# Patient Record
Sex: Female | Born: 1950 | Race: White | Hispanic: No | Marital: Married | Smoking: Former smoker
Health system: Southern US, Community
[De-identification: ages and names within clinical notes are randomized; demographics above are authoritative.]

## PROBLEM LIST (undated history)

## (undated) DIAGNOSIS — R569 Unspecified convulsions: Secondary | ICD-10-CM

## (undated) DIAGNOSIS — K219 Gastro-esophageal reflux disease without esophagitis: Secondary | ICD-10-CM

## (undated) DIAGNOSIS — M797 Fibromyalgia: Secondary | ICD-10-CM

## (undated) DIAGNOSIS — E079 Disorder of thyroid, unspecified: Secondary | ICD-10-CM

## (undated) DIAGNOSIS — M8580 Other specified disorders of bone density and structure, unspecified site: Secondary | ICD-10-CM

## (undated) DIAGNOSIS — F419 Anxiety disorder, unspecified: Secondary | ICD-10-CM

## (undated) DIAGNOSIS — G40909 Epilepsy, unspecified, not intractable, without status epilepticus: Secondary | ICD-10-CM

## (undated) DIAGNOSIS — R9431 Abnormal electrocardiogram [ECG] [EKG]: Secondary | ICD-10-CM

## (undated) DIAGNOSIS — G2581 Restless legs syndrome: Secondary | ICD-10-CM

## (undated) DIAGNOSIS — F32A Depression, unspecified: Secondary | ICD-10-CM

## (undated) DIAGNOSIS — F1010 Alcohol abuse, uncomplicated: Secondary | ICD-10-CM

## (undated) DIAGNOSIS — T7840XA Allergy, unspecified, initial encounter: Secondary | ICD-10-CM

## (undated) DIAGNOSIS — E785 Hyperlipidemia, unspecified: Secondary | ICD-10-CM

## (undated) DIAGNOSIS — K589 Irritable bowel syndrome without diarrhea: Secondary | ICD-10-CM

## (undated) DIAGNOSIS — E063 Autoimmune thyroiditis: Secondary | ICD-10-CM

## (undated) DIAGNOSIS — F329 Major depressive disorder, single episode, unspecified: Secondary | ICD-10-CM

## (undated) HISTORY — DX: Disorder of thyroid, unspecified: E07.9

## (undated) HISTORY — DX: Autoimmune thyroiditis: E06.3

## (undated) HISTORY — DX: Restless legs syndrome: G25.81

## (undated) HISTORY — PX: WRIST SURGERY: SHX841

## (undated) HISTORY — DX: Allergy, unspecified, initial encounter: T78.40XA

## (undated) HISTORY — PX: BUNIONECTOMY: SHX129

## (undated) HISTORY — PX: FOOT SURGERY: SHX648

## (undated) HISTORY — DX: Major depressive disorder, single episode, unspecified: F32.9

## (undated) HISTORY — DX: Epilepsy, unspecified, not intractable, without status epilepticus: G40.909

## (undated) HISTORY — PX: TONSILLECTOMY: SUR1361

## (undated) HISTORY — DX: Alcohol abuse, uncomplicated: F10.10

## (undated) HISTORY — DX: Other specified disorders of bone density and structure, unspecified site: M85.80

## (undated) HISTORY — PX: LIPOSUCTION: SHX10

## (undated) HISTORY — DX: Fibromyalgia: M79.7

## (undated) HISTORY — DX: Irritable bowel syndrome, unspecified: K58.9

## (undated) HISTORY — DX: Depression, unspecified: F32.A

## (undated) HISTORY — DX: Gastro-esophageal reflux disease without esophagitis: K21.9

## (undated) HISTORY — DX: Hyperlipidemia, unspecified: E78.5

---

## 1998-05-20 ENCOUNTER — Other Ambulatory Visit: Admission: RE | Admit: 1998-05-20 | Discharge: 1998-05-20 | Payer: Self-pay | Admitting: *Deleted

## 1998-10-08 ENCOUNTER — Encounter: Payer: Self-pay | Admitting: Gastroenterology

## 1998-10-08 ENCOUNTER — Ambulatory Visit (HOSPITAL_COMMUNITY): Admission: RE | Admit: 1998-10-08 | Discharge: 1998-10-08 | Payer: Self-pay | Admitting: Gastroenterology

## 1999-01-17 ENCOUNTER — Encounter (INDEPENDENT_AMBULATORY_CARE_PROVIDER_SITE_OTHER): Payer: Self-pay | Admitting: Specialist

## 1999-01-17 ENCOUNTER — Other Ambulatory Visit: Admission: RE | Admit: 1999-01-17 | Discharge: 1999-01-17 | Payer: Self-pay | Admitting: *Deleted

## 1999-09-04 ENCOUNTER — Other Ambulatory Visit: Admission: RE | Admit: 1999-09-04 | Discharge: 1999-09-04 | Payer: Self-pay | Admitting: *Deleted

## 2000-01-27 ENCOUNTER — Emergency Department (HOSPITAL_COMMUNITY): Admission: EM | Admit: 2000-01-27 | Discharge: 2000-01-27 | Payer: Self-pay | Admitting: Emergency Medicine

## 2001-06-08 ENCOUNTER — Ambulatory Visit (HOSPITAL_COMMUNITY): Admission: RE | Admit: 2001-06-08 | Discharge: 2001-06-08 | Payer: Self-pay | Admitting: Family Medicine

## 2001-06-08 ENCOUNTER — Encounter: Payer: Self-pay | Admitting: Family Medicine

## 2001-06-20 ENCOUNTER — Ambulatory Visit (HOSPITAL_COMMUNITY): Admission: RE | Admit: 2001-06-20 | Discharge: 2001-06-20 | Payer: Self-pay | Admitting: Family Medicine

## 2001-06-20 ENCOUNTER — Encounter: Payer: Self-pay | Admitting: Family Medicine

## 2001-06-30 ENCOUNTER — Encounter: Admission: RE | Admit: 2001-06-30 | Discharge: 2001-06-30 | Payer: Self-pay | Admitting: Family Medicine

## 2001-06-30 ENCOUNTER — Encounter: Payer: Self-pay | Admitting: Family Medicine

## 2001-11-07 ENCOUNTER — Other Ambulatory Visit: Admission: RE | Admit: 2001-11-07 | Discharge: 2001-11-07 | Payer: Self-pay | Admitting: *Deleted

## 2003-02-15 ENCOUNTER — Other Ambulatory Visit: Admission: RE | Admit: 2003-02-15 | Discharge: 2003-02-15 | Payer: Self-pay

## 2004-10-20 ENCOUNTER — Other Ambulatory Visit: Admission: RE | Admit: 2004-10-20 | Discharge: 2004-10-20 | Payer: Self-pay | Admitting: Obstetrics and Gynecology

## 2005-02-11 ENCOUNTER — Encounter: Admission: RE | Admit: 2005-02-11 | Discharge: 2005-02-11 | Payer: Self-pay | Admitting: Gastroenterology

## 2006-01-30 ENCOUNTER — Observation Stay (HOSPITAL_COMMUNITY): Admission: EM | Admit: 2006-01-30 | Discharge: 2006-02-01 | Payer: Self-pay | Admitting: Emergency Medicine

## 2006-02-01 ENCOUNTER — Encounter: Payer: Self-pay | Admitting: Cardiology

## 2006-06-25 ENCOUNTER — Ambulatory Visit: Payer: Self-pay | Admitting: Family Medicine

## 2006-07-01 ENCOUNTER — Ambulatory Visit: Payer: Self-pay | Admitting: Family Medicine

## 2006-07-01 LAB — CONVERTED CEMR LAB
ALT: 19 units/L (ref 0–40)
AST: 23 units/L (ref 0–37)
HDL: 71.5 mg/dL (ref >39.0)
TSH: 4.08 microintl units/mL (ref 0.35–5.50)

## 2006-07-23 ENCOUNTER — Ambulatory Visit: Payer: Self-pay | Admitting: Family Medicine

## 2006-08-02 ENCOUNTER — Ambulatory Visit: Payer: Self-pay | Admitting: Family Medicine

## 2006-08-02 LAB — CONVERTED CEMR LAB
ALT: 23 units/L (ref 0–40)
Albumin: 3.6 g/dL (ref 3.5–5.2)
Basophils Relative: 0.2 % (ref 0.0–1.0)
Chloride: 109 meq/L (ref 96–112)
Creatinine, Ser: 0.7 mg/dL (ref 0.4–1.2)
MCHC: 33.9 g/dL (ref 30.0–36.0)
MCV: 91.1 fL (ref 78.0–100.0)
Neutrophils Relative %: 67.6 % (ref 43.0–77.0)
Platelets: 229 10*3/uL (ref 150–400)
Potassium: 4.1 meq/L (ref 3.5–5.1)
Total Protein: 6 g/dL (ref 6.0–8.3)
WBC: 5 10*3/uL (ref 4.5–10.5)

## 2006-08-05 ENCOUNTER — Encounter: Payer: Self-pay | Admitting: Family Medicine

## 2006-10-13 ENCOUNTER — Encounter: Admission: RE | Admit: 2006-10-13 | Discharge: 2006-10-13 | Payer: Self-pay | Admitting: Family Medicine

## 2006-10-13 ENCOUNTER — Ambulatory Visit: Payer: Self-pay | Admitting: Family Medicine

## 2006-12-15 DIAGNOSIS — K649 Unspecified hemorrhoids: Secondary | ICD-10-CM | POA: Insufficient documentation

## 2006-12-15 DIAGNOSIS — K219 Gastro-esophageal reflux disease without esophagitis: Secondary | ICD-10-CM

## 2006-12-15 DIAGNOSIS — G40909 Epilepsy, unspecified, not intractable, without status epilepticus: Secondary | ICD-10-CM | POA: Insufficient documentation

## 2006-12-15 DIAGNOSIS — G9340 Encephalopathy, unspecified: Secondary | ICD-10-CM | POA: Insufficient documentation

## 2006-12-15 DIAGNOSIS — E039 Hypothyroidism, unspecified: Secondary | ICD-10-CM

## 2006-12-15 DIAGNOSIS — M899 Disorder of bone, unspecified: Secondary | ICD-10-CM | POA: Insufficient documentation

## 2006-12-15 DIAGNOSIS — R319 Hematuria, unspecified: Secondary | ICD-10-CM | POA: Insufficient documentation

## 2006-12-15 DIAGNOSIS — M949 Disorder of cartilage, unspecified: Secondary | ICD-10-CM

## 2007-01-12 ENCOUNTER — Encounter (INDEPENDENT_AMBULATORY_CARE_PROVIDER_SITE_OTHER): Payer: Self-pay | Admitting: Family Medicine

## 2007-02-09 ENCOUNTER — Encounter (INDEPENDENT_AMBULATORY_CARE_PROVIDER_SITE_OTHER): Payer: Self-pay | Admitting: Family Medicine

## 2007-02-22 ENCOUNTER — Telehealth (INDEPENDENT_AMBULATORY_CARE_PROVIDER_SITE_OTHER): Payer: Self-pay | Admitting: *Deleted

## 2007-03-09 ENCOUNTER — Ambulatory Visit: Payer: Self-pay | Admitting: Family Medicine

## 2007-03-09 ENCOUNTER — Encounter (INDEPENDENT_AMBULATORY_CARE_PROVIDER_SITE_OTHER): Payer: Self-pay | Admitting: Family Medicine

## 2007-03-09 DIAGNOSIS — M25549 Pain in joints of unspecified hand: Secondary | ICD-10-CM

## 2007-03-10 ENCOUNTER — Telehealth (INDEPENDENT_AMBULATORY_CARE_PROVIDER_SITE_OTHER): Payer: Self-pay | Admitting: *Deleted

## 2007-03-15 ENCOUNTER — Ambulatory Visit: Payer: Self-pay | Admitting: Family Medicine

## 2007-03-22 ENCOUNTER — Telehealth (INDEPENDENT_AMBULATORY_CARE_PROVIDER_SITE_OTHER): Payer: Self-pay | Admitting: *Deleted

## 2007-03-22 LAB — CONVERTED CEMR LAB
ALT: 28 U/L
AST: 33 U/L
Cholesterol: 167 mg/dL
HDL: 61.6 mg/dL
LDL Cholesterol: 87 mg/dL
Total CHOL/HDL Ratio: 2.7
Triglycerides: 94 mg/dL
VLDL: 19 mg/dL

## 2007-04-20 LAB — HM COLONOSCOPY

## 2007-05-11 ENCOUNTER — Ambulatory Visit: Payer: Self-pay | Admitting: Family Medicine

## 2007-05-11 DIAGNOSIS — J019 Acute sinusitis, unspecified: Secondary | ICD-10-CM

## 2007-05-11 DIAGNOSIS — J309 Allergic rhinitis, unspecified: Secondary | ICD-10-CM | POA: Insufficient documentation

## 2007-06-29 ENCOUNTER — Encounter (INDEPENDENT_AMBULATORY_CARE_PROVIDER_SITE_OTHER): Payer: Self-pay | Admitting: Family Medicine

## 2007-07-16 ENCOUNTER — Emergency Department (HOSPITAL_COMMUNITY): Admission: EM | Admit: 2007-07-16 | Discharge: 2007-07-16 | Payer: Self-pay | Admitting: Emergency Medicine

## 2007-07-26 ENCOUNTER — Encounter (INDEPENDENT_AMBULATORY_CARE_PROVIDER_SITE_OTHER): Payer: Self-pay | Admitting: Family Medicine

## 2007-08-02 ENCOUNTER — Ambulatory Visit: Payer: Self-pay | Admitting: Family Medicine

## 2007-08-02 ENCOUNTER — Telehealth (INDEPENDENT_AMBULATORY_CARE_PROVIDER_SITE_OTHER): Payer: Self-pay | Admitting: Family Medicine

## 2007-08-02 DIAGNOSIS — E785 Hyperlipidemia, unspecified: Secondary | ICD-10-CM

## 2007-08-02 DIAGNOSIS — F1010 Alcohol abuse, uncomplicated: Secondary | ICD-10-CM | POA: Insufficient documentation

## 2007-08-04 ENCOUNTER — Other Ambulatory Visit: Admission: RE | Admit: 2007-08-04 | Discharge: 2007-08-04 | Payer: Self-pay | Admitting: Obstetrics and Gynecology

## 2007-08-04 LAB — CONVERTED CEMR LAB
ALT: 25 units/L (ref 0–35)
AST: 26 units/L (ref 0–37)
Cholesterol: 174 mg/dL (ref 0–200)
HDL: 65.6 mg/dL (ref >39.0)

## 2007-08-05 ENCOUNTER — Encounter (INDEPENDENT_AMBULATORY_CARE_PROVIDER_SITE_OTHER): Payer: Self-pay | Admitting: *Deleted

## 2007-08-10 ENCOUNTER — Telehealth (INDEPENDENT_AMBULATORY_CARE_PROVIDER_SITE_OTHER): Payer: Self-pay | Admitting: *Deleted

## 2007-08-11 ENCOUNTER — Ambulatory Visit: Payer: Self-pay | Admitting: Family Medicine

## 2007-08-11 DIAGNOSIS — R61 Generalized hyperhidrosis: Secondary | ICD-10-CM

## 2007-08-11 LAB — CONVERTED CEMR LAB
ALT: 26 units/L (ref 0–35)
AST: 26 units/L (ref 0–37)
Alkaline Phosphatase: 102 units/L (ref 39–117)
BUN: 21 mg/dL (ref 6–23)
Bilirubin, Direct: 0.1 mg/dL (ref 0.0–0.3)
CO2: 26 meq/L (ref 19–32)
Calcium: 9.8 mg/dL (ref 8.4–10.5)
Creatinine, Ser: 0.8 mg/dL (ref 0.4–1.2)
Eosinophils Absolute: 0.1 10*3/uL (ref 0.0–0.6)
Eosinophils Relative: 1 % (ref 0.0–5.0)
GFR calc non Af Amer: 79 mL/min
Lymphocytes Relative: 32.9 % (ref 12.0–46.0)
MCHC: 35.4 g/dL (ref 30.0–36.0)
Monocytes Absolute: 0.5 10*3/uL (ref 0.2–0.7)
Monocytes Relative: 6.8 % (ref 3.0–11.0)
RBC: 4.6 M/uL (ref 3.87–5.11)
RDW: 12.6 % (ref 11.5–14.6)
Total Bilirubin: 0.6 mg/dL (ref 0.3–1.2)
Total Protein: 7.1 g/dL (ref 6.0–8.3)
WBC: 7.1 10*3/uL (ref 4.5–10.5)

## 2007-08-12 ENCOUNTER — Telehealth (INDEPENDENT_AMBULATORY_CARE_PROVIDER_SITE_OTHER): Payer: Self-pay | Admitting: Family Medicine

## 2008-01-17 LAB — CONVERTED CEMR LAB: Pap Smear: NORMAL

## 2008-01-23 ENCOUNTER — Encounter: Payer: Self-pay | Admitting: Family Medicine

## 2008-02-24 ENCOUNTER — Ambulatory Visit: Payer: Self-pay | Admitting: *Deleted

## 2008-02-24 ENCOUNTER — Encounter: Payer: Self-pay | Admitting: Family Medicine

## 2008-02-24 LAB — CONVERTED CEMR LAB
Albumin: 4.5 g/dL (ref 3.5–5.2)
Alkaline Phosphatase: 124 units/L — ABNORMAL HIGH (ref 39–117)
Bilirubin, Direct: 0.1 mg/dL (ref 0.0–0.3)
TSH: 0.929 microintl units/mL (ref 0.350–4.500)
Total Protein: 6.8 g/dL (ref 6.0–8.3)

## 2008-03-29 ENCOUNTER — Ambulatory Visit: Payer: Self-pay | Admitting: *Deleted

## 2008-03-29 LAB — CONVERTED CEMR LAB
Alkaline Phosphatase: 109 units/L (ref 39–117)
Bilirubin, Direct: 0.1 mg/dL (ref 0.0–0.3)
Total Bilirubin: 0.7 mg/dL (ref 0.3–1.2)

## 2008-05-31 ENCOUNTER — Emergency Department (HOSPITAL_BASED_OUTPATIENT_CLINIC_OR_DEPARTMENT_OTHER): Admission: EM | Admit: 2008-05-31 | Discharge: 2008-05-31 | Payer: Self-pay | Admitting: Emergency Medicine

## 2008-07-02 ENCOUNTER — Encounter (INDEPENDENT_AMBULATORY_CARE_PROVIDER_SITE_OTHER): Payer: Self-pay | Admitting: *Deleted

## 2008-07-03 ENCOUNTER — Telehealth (INDEPENDENT_AMBULATORY_CARE_PROVIDER_SITE_OTHER): Payer: Self-pay | Admitting: *Deleted

## 2008-07-04 ENCOUNTER — Ambulatory Visit: Payer: Self-pay | Admitting: *Deleted

## 2008-07-04 DIAGNOSIS — F329 Major depressive disorder, single episode, unspecified: Secondary | ICD-10-CM

## 2008-07-04 DIAGNOSIS — J069 Acute upper respiratory infection, unspecified: Secondary | ICD-10-CM | POA: Insufficient documentation

## 2008-07-04 DIAGNOSIS — H60399 Other infective otitis externa, unspecified ear: Secondary | ICD-10-CM | POA: Insufficient documentation

## 2008-07-23 ENCOUNTER — Ambulatory Visit: Payer: Self-pay | Admitting: *Deleted

## 2008-07-23 DIAGNOSIS — H6120 Impacted cerumen, unspecified ear: Secondary | ICD-10-CM | POA: Insufficient documentation

## 2008-07-30 ENCOUNTER — Ambulatory Visit: Payer: Self-pay | Admitting: *Deleted

## 2008-09-03 ENCOUNTER — Telehealth (INDEPENDENT_AMBULATORY_CARE_PROVIDER_SITE_OTHER): Payer: Self-pay | Admitting: *Deleted

## 2008-09-05 ENCOUNTER — Ambulatory Visit: Payer: Self-pay | Admitting: *Deleted

## 2008-09-05 DIAGNOSIS — G2581 Restless legs syndrome: Secondary | ICD-10-CM | POA: Insufficient documentation

## 2008-09-05 LAB — CONVERTED CEMR LAB
ALT: 20 units/L (ref 0–35)
BUN: 22 mg/dL (ref 6–23)
Calcium: 9.3 mg/dL (ref 8.4–10.5)
Cholesterol: 180 mg/dL (ref 0–200)
Eosinophils Absolute: 0.1 10*3/uL (ref 0.0–0.7)
GFR calc Af Amer: 133 mL/min
GFR calc non Af Amer: 110 mL/min
HDL: 69.9 mg/dL (ref >39.0)
Hemoglobin: 14.3 g/dL (ref 12.0–15.0)
Lymphocytes Relative: 32.8 % (ref 12.0–46.0)
Monocytes Absolute: 0.5 10*3/uL (ref 0.1–1.0)
Monocytes Relative: 7.1 % (ref 3.0–12.0)
Neutro Abs: 4 10*3/uL (ref 1.4–7.7)
Neutrophils Relative %: 58.2 % (ref 43.0–77.0)
Platelets: 255 10*3/uL (ref 150–400)
TSH: 0.42 microintl units/mL (ref 0.35–5.50)
VLDL: 23 mg/dL (ref 0–40)

## 2008-11-16 ENCOUNTER — Telehealth: Payer: Self-pay | Admitting: Internal Medicine

## 2008-12-13 ENCOUNTER — Ambulatory Visit: Payer: Self-pay | Admitting: Family Medicine

## 2008-12-13 DIAGNOSIS — M722 Plantar fascial fibromatosis: Secondary | ICD-10-CM

## 2009-01-10 ENCOUNTER — Ambulatory Visit: Payer: Self-pay | Admitting: Family Medicine

## 2009-01-16 ENCOUNTER — Encounter (INDEPENDENT_AMBULATORY_CARE_PROVIDER_SITE_OTHER): Payer: Self-pay | Admitting: *Deleted

## 2009-01-16 ENCOUNTER — Encounter: Payer: Self-pay | Admitting: Family Medicine

## 2009-01-16 LAB — CONVERTED CEMR LAB
AST: 20 units/L
Alkaline Phosphatase: 125 units/L
BUN: 27 mg/dL
Creatinine, Ser: 0.74 mg/dL
Total Bilirubin: 0.3 mg/dL
Total Protein: 6.7 g/dL

## 2009-01-18 ENCOUNTER — Encounter (INDEPENDENT_AMBULATORY_CARE_PROVIDER_SITE_OTHER): Payer: Self-pay | Admitting: *Deleted

## 2009-01-23 ENCOUNTER — Encounter: Payer: Self-pay | Admitting: Family Medicine

## 2009-02-04 ENCOUNTER — Telehealth (INDEPENDENT_AMBULATORY_CARE_PROVIDER_SITE_OTHER): Payer: Self-pay | Admitting: *Deleted

## 2009-02-25 ENCOUNTER — Ambulatory Visit: Payer: Self-pay | Admitting: Family Medicine

## 2009-02-27 ENCOUNTER — Encounter (INDEPENDENT_AMBULATORY_CARE_PROVIDER_SITE_OTHER): Payer: Self-pay | Admitting: *Deleted

## 2009-03-26 ENCOUNTER — Telehealth (INDEPENDENT_AMBULATORY_CARE_PROVIDER_SITE_OTHER): Payer: Self-pay | Admitting: *Deleted

## 2009-04-16 ENCOUNTER — Encounter (INDEPENDENT_AMBULATORY_CARE_PROVIDER_SITE_OTHER): Payer: Self-pay | Admitting: *Deleted

## 2009-05-02 ENCOUNTER — Telehealth (INDEPENDENT_AMBULATORY_CARE_PROVIDER_SITE_OTHER): Payer: Self-pay | Admitting: *Deleted

## 2009-06-10 ENCOUNTER — Ambulatory Visit: Payer: Self-pay | Admitting: Family Medicine

## 2009-06-10 ENCOUNTER — Encounter: Payer: Self-pay | Admitting: Family Medicine

## 2009-06-10 DIAGNOSIS — M6740 Ganglion, unspecified site: Secondary | ICD-10-CM | POA: Insufficient documentation

## 2009-06-10 DIAGNOSIS — Z780 Asymptomatic menopausal state: Secondary | ICD-10-CM | POA: Insufficient documentation

## 2009-06-10 DIAGNOSIS — H5310 Unspecified subjective visual disturbances: Secondary | ICD-10-CM | POA: Insufficient documentation

## 2009-06-11 ENCOUNTER — Encounter: Payer: Self-pay | Admitting: Family Medicine

## 2009-06-11 ENCOUNTER — Ambulatory Visit: Payer: Self-pay | Admitting: Family Medicine

## 2009-06-11 LAB — CONVERTED CEMR LAB
Bilirubin Urine: NEGATIVE
WBC Urine, dipstick: NEGATIVE
pH: 7

## 2009-06-12 ENCOUNTER — Encounter: Payer: Self-pay | Admitting: Family Medicine

## 2009-06-17 LAB — CONVERTED CEMR LAB
ALT: 24 units/L (ref 0–35)
BUN: 23 mg/dL (ref 6–23)
CO2: 28 meq/L (ref 19–32)
Calcium: 9.5 mg/dL (ref 8.4–10.5)
Chloride: 111 meq/L (ref 96–112)
Eosinophils Relative: 1.2 % (ref 0.0–5.0)
Glucose, Bld: 97 mg/dL (ref 70–99)
HCT: 42 % (ref 36.0–46.0)
HDL: 71.9 mg/dL (ref >39.00)
LDL Cholesterol: 99 mg/dL (ref 0–99)
Lymphs Abs: 2.3 10*3/uL (ref 0.7–4.0)
MCV: 94.3 fL (ref 78.0–100.0)
Monocytes Absolute: 0.4 10*3/uL (ref 0.1–1.0)
Monocytes Relative: 5.8 % (ref 3.0–12.0)
Neutrophils Relative %: 58.7 % (ref 43.0–77.0)
Platelets: 253 10*3/uL (ref 150.0–400.0)
Potassium: 4.7 meq/L (ref 3.5–5.1)
RBC: 4.45 M/uL (ref 3.87–5.11)
Sodium: 144 meq/L (ref 135–145)
Total Bilirubin: 0.8 mg/dL (ref 0.3–1.2)
Total Protein: 6.9 g/dL (ref 6.0–8.3)
Triglycerides: 94 mg/dL (ref 0.0–149.0)
WBC: 6.8 10*3/uL (ref 4.5–10.5)

## 2009-07-29 ENCOUNTER — Encounter: Payer: Self-pay | Admitting: Family Medicine

## 2009-09-10 ENCOUNTER — Ambulatory Visit: Payer: Self-pay | Admitting: Family

## 2009-09-10 DIAGNOSIS — R197 Diarrhea, unspecified: Secondary | ICD-10-CM

## 2009-09-10 DIAGNOSIS — R5383 Other fatigue: Secondary | ICD-10-CM

## 2009-09-10 DIAGNOSIS — R5381 Other malaise: Secondary | ICD-10-CM | POA: Insufficient documentation

## 2009-09-10 LAB — CONVERTED CEMR LAB
ALT: 21 units/L (ref 0–35)
BUN: 20 mg/dL (ref 6–23)
Basophils Absolute: 0.1 10*3/uL (ref 0.0–0.1)
Chloride: 111 meq/L (ref 96–112)
Eosinophils Absolute: 0 10*3/uL (ref 0.0–0.7)
GFR calc non Af Amer: 78.22 mL/min (ref >60)
HCT: 41.2 % (ref 36.0–46.0)
Lymphocytes Relative: 32.2 % (ref 12.0–46.0)
Lymphs Abs: 2.1 10*3/uL (ref 0.7–4.0)
Monocytes Absolute: 0.4 10*3/uL (ref 0.1–1.0)
Monocytes Relative: 6.9 % (ref 3.0–12.0)
Neutrophils Relative %: 59.4 % (ref 43.0–77.0)
Platelets: 247 10*3/uL (ref 150.0–400.0)
RBC: 4.31 M/uL (ref 3.87–5.11)
Tissue Transglutaminase Ab, IgA: 0.1 units (ref <7)
Total Bilirubin: 0.2 mg/dL — ABNORMAL LOW (ref 0.3–1.2)

## 2009-09-11 ENCOUNTER — Encounter: Payer: Self-pay | Admitting: Family

## 2009-09-16 ENCOUNTER — Encounter: Payer: Self-pay | Admitting: Family Medicine

## 2009-09-24 ENCOUNTER — Ambulatory Visit: Payer: Self-pay | Admitting: Family

## 2009-10-04 ENCOUNTER — Ambulatory Visit: Payer: Self-pay | Admitting: Family Medicine

## 2009-10-04 ENCOUNTER — Encounter: Payer: Self-pay | Admitting: Family Medicine

## 2009-10-04 DIAGNOSIS — R079 Chest pain, unspecified: Secondary | ICD-10-CM | POA: Insufficient documentation

## 2009-10-07 LAB — CONVERTED CEMR LAB
ALT: 18 units/L (ref 0–35)
AST: 21 units/L (ref 0–37)
Albumin: 4.1 g/dL (ref 3.5–5.2)
Basophils Absolute: 0.1 10*3/uL (ref 0.0–0.1)
Bilirubin, Direct: 0.1 mg/dL (ref 0.0–0.3)
CK-MB: 1.3 ng/mL (ref 0.3–4.0)
CO2: 27 meq/L (ref 19–32)
Calcium: 9.3 mg/dL (ref 8.4–10.5)
Eosinophils Absolute: 0.1 10*3/uL (ref 0.0–0.7)
Eosinophils Relative: 0.7 % (ref 0.0–5.0)
GFR calc non Af Amer: 91.23 mL/min (ref >60)
HCT: 41.3 % (ref 36.0–46.0)
Hemoglobin: 13.9 g/dL (ref 12.0–15.0)
Lymphocytes Relative: 34.9 % (ref 12.0–46.0)
MCHC: 33.6 g/dL (ref 30.0–36.0)
Platelets: 236 10*3/uL (ref 150.0–400.0)
Potassium: 3.5 meq/L (ref 3.5–5.1)
RBC: 4.38 M/uL (ref 3.87–5.11)
T3, Free: 2.5 pg/mL (ref 2.3–4.2)
TSH: 0.68 microintl units/mL (ref 0.35–5.50)
Total Bilirubin: 0.5 mg/dL (ref 0.3–1.2)
Total CK: 60 units/L (ref 7–177)
Total Protein: 6.9 g/dL (ref 6.0–8.3)

## 2009-10-08 LAB — CONVERTED CEMR LAB
Anti Nuclear Antibody(ANA): NEGATIVE
Vit D, 25-Hydroxy: 38 ng/mL (ref 30–89)

## 2009-10-15 ENCOUNTER — Encounter: Payer: Self-pay | Admitting: Family Medicine

## 2009-12-03 ENCOUNTER — Encounter: Payer: Self-pay | Admitting: Family Medicine

## 2009-12-09 ENCOUNTER — Telehealth (INDEPENDENT_AMBULATORY_CARE_PROVIDER_SITE_OTHER): Payer: Self-pay | Admitting: *Deleted

## 2009-12-23 ENCOUNTER — Telehealth (INDEPENDENT_AMBULATORY_CARE_PROVIDER_SITE_OTHER): Payer: Self-pay | Admitting: *Deleted

## 2009-12-24 ENCOUNTER — Encounter: Payer: Self-pay | Admitting: Family Medicine

## 2010-01-08 ENCOUNTER — Inpatient Hospital Stay (HOSPITAL_COMMUNITY): Admission: EM | Admit: 2010-01-08 | Discharge: 2010-01-10 | Payer: Self-pay | Admitting: Internal Medicine

## 2010-01-08 ENCOUNTER — Encounter: Payer: Self-pay | Admitting: Emergency Medicine

## 2010-01-08 ENCOUNTER — Ambulatory Visit: Payer: Self-pay | Admitting: Radiology

## 2010-01-16 ENCOUNTER — Ambulatory Visit: Payer: Self-pay | Admitting: Family Medicine

## 2010-01-16 DIAGNOSIS — N3946 Mixed incontinence: Secondary | ICD-10-CM

## 2010-01-16 DIAGNOSIS — J189 Pneumonia, unspecified organism: Secondary | ICD-10-CM

## 2010-02-10 ENCOUNTER — Other Ambulatory Visit: Admission: RE | Admit: 2010-02-10 | Discharge: 2010-02-10 | Payer: Self-pay | Admitting: Family Medicine

## 2010-02-10 ENCOUNTER — Ambulatory Visit: Payer: Self-pay | Admitting: Family Medicine

## 2010-02-10 DIAGNOSIS — IMO0002 Reserved for concepts with insufficient information to code with codable children: Secondary | ICD-10-CM | POA: Insufficient documentation

## 2010-02-10 DIAGNOSIS — L0591 Pilonidal cyst without abscess: Secondary | ICD-10-CM | POA: Insufficient documentation

## 2010-02-10 DIAGNOSIS — N952 Postmenopausal atrophic vaginitis: Secondary | ICD-10-CM | POA: Insufficient documentation

## 2010-02-17 ENCOUNTER — Encounter (INDEPENDENT_AMBULATORY_CARE_PROVIDER_SITE_OTHER): Payer: Self-pay | Admitting: *Deleted

## 2010-02-17 LAB — CONVERTED CEMR LAB: Pap Smear: NEGATIVE

## 2010-02-25 ENCOUNTER — Encounter: Payer: Self-pay | Admitting: Family Medicine

## 2010-02-26 ENCOUNTER — Encounter: Payer: Self-pay | Admitting: Family Medicine

## 2010-03-05 ENCOUNTER — Encounter: Payer: Self-pay | Admitting: Family Medicine

## 2010-03-06 ENCOUNTER — Ambulatory Visit (HOSPITAL_BASED_OUTPATIENT_CLINIC_OR_DEPARTMENT_OTHER): Admission: RE | Admit: 2010-03-06 | Discharge: 2010-03-06 | Payer: Self-pay | Admitting: Family Medicine

## 2010-03-06 ENCOUNTER — Ambulatory Visit: Payer: Self-pay | Admitting: Diagnostic Radiology

## 2010-04-03 ENCOUNTER — Ambulatory Visit: Payer: Self-pay | Admitting: Family Medicine

## 2010-04-04 ENCOUNTER — Encounter: Payer: Self-pay | Admitting: Family Medicine

## 2010-04-04 LAB — CONVERTED CEMR LAB
ALT: 18 units/L (ref 0–35)
Albumin: 4.3 g/dL (ref 3.5–5.2)
Alkaline Phosphatase: 102 units/L (ref 39–117)
Total Protein: 6.5 g/dL (ref 6.0–8.3)
VLDL: 16.8 mg/dL (ref 0.0–40.0)

## 2010-04-07 ENCOUNTER — Encounter: Payer: Self-pay | Admitting: Family Medicine

## 2010-04-15 ENCOUNTER — Encounter: Payer: Self-pay | Admitting: Family Medicine

## 2010-04-17 ENCOUNTER — Telehealth: Payer: Self-pay | Admitting: Family Medicine

## 2010-04-24 ENCOUNTER — Encounter: Payer: Self-pay | Admitting: Family Medicine

## 2010-04-25 ENCOUNTER — Encounter: Admission: RE | Admit: 2010-04-25 | Discharge: 2010-04-25 | Payer: Self-pay | Admitting: Gastroenterology

## 2010-05-16 ENCOUNTER — Encounter: Payer: Self-pay | Admitting: Family Medicine

## 2010-05-16 LAB — HM MAMMOGRAPHY: HM Mammogram: NORMAL

## 2010-05-22 ENCOUNTER — Encounter (INDEPENDENT_AMBULATORY_CARE_PROVIDER_SITE_OTHER): Payer: Self-pay | Admitting: *Deleted

## 2010-05-22 ENCOUNTER — Encounter: Payer: Self-pay | Admitting: Family Medicine

## 2010-06-23 ENCOUNTER — Encounter: Payer: Self-pay | Admitting: Family Medicine

## 2010-07-03 ENCOUNTER — Encounter: Payer: Self-pay | Admitting: Family Medicine

## 2010-07-10 ENCOUNTER — Encounter: Payer: Self-pay | Admitting: Family Medicine

## 2010-08-18 ENCOUNTER — Emergency Department (HOSPITAL_COMMUNITY)
Admission: EM | Admit: 2010-08-18 | Discharge: 2010-08-19 | Payer: Self-pay | Source: Home / Self Care | Admitting: Emergency Medicine

## 2010-08-18 ENCOUNTER — Telehealth: Payer: Self-pay | Admitting: Family Medicine

## 2010-08-18 LAB — BASIC METABOLIC PANEL
CO2: 24 mEq/L (ref 19–32)
Chloride: 112 mEq/L (ref 96–112)
GFR calc Af Amer: >60 mL/min (ref >60)
Potassium: 3.6 mEq/L (ref 3.5–5.1)

## 2010-08-18 LAB — CBC
HCT: 39.9 % (ref 36.0–46.0)
Hemoglobin: 14.1 g/dL (ref 12.0–15.0)
MCH: 31.2 pg (ref 26.0–34.0)
MCHC: 35.3 g/dL (ref 30.0–36.0)
MCV: 88.3 fL (ref 78.0–100.0)

## 2010-08-18 LAB — DIFFERENTIAL
Basophils Absolute: 0.1 10*3/uL (ref 0.0–0.1)
Basophils Relative: 1 % (ref 0–1)
Eosinophils Relative: 1 % (ref 0–5)
Lymphs Abs: 3.4 10*3/uL (ref 0.7–4.0)
Monocytes Relative: 6 % (ref 3–12)
Neutro Abs: 4.1 10*3/uL (ref 1.7–7.7)

## 2010-08-18 LAB — RAPID URINE DRUG SCREEN, HOSP PERFORMED
Amphetamines: NOT DETECTED
Barbiturates: NOT DETECTED
Opiates: NOT DETECTED

## 2010-08-18 LAB — ETHANOL: Alcohol, Ethyl (B): <5 mg/dL (ref 0–10)

## 2010-08-19 DIAGNOSIS — F331 Major depressive disorder, recurrent, moderate: Secondary | ICD-10-CM

## 2010-08-19 LAB — TSH: TSH: 0.212 u[IU]/mL — ABNORMAL LOW (ref 0.350–4.500)

## 2010-08-21 NOTE — Letter (Signed)
   [facility] [address] [place], [place] [address] [phone]    September 11, 2009   IMA HAFNER [address] [place], [place] [address]  RE:  LAB RESULTS  Dear  Ms. Lawless,  The following is an interpretation of your most recent lab tests.  Please take note of any instructions provided or changes to medications that have resulted from your lab work.    celiac panel is normal   Sincerely Yours,    Lemont Fillers FNP

## 2010-08-21 NOTE — Letter (Signed)
Summary: [facility]   Imported By: Lanelle Bal 05/09/2010 08:22:12  _____________________________________________________________________  External Attachment:    Type:   Image     Comment:   External Document

## 2010-08-21 NOTE — Progress Notes (Signed)
Summary: Levothyroxine Refill  Phone Note Refill Request Message from:  Fax from Pharmacy on Dec 09, 2009 10:09 AM  Refills Requested: Medication #1:  SYNTHROID 137 MCG TABS Take one tablet daily.   Dosage confirmed as above?Dosage Confirmed   Brand Name Necessary? No   Supply Requested: 3 months   Last Refilled: 11/04/2009  Method Requested: Electronic Next Appointment Scheduled: None Initial call taken by: Glendell Docker CMA,  Dec 09, 2009 10:09 AM    Prescriptions: SYNTHROID 137 MCG TABS (LEVOTHYROXINE SODIUM) Take one tablet daily.  #30 x 3   Entered by:   Doristine Devoid   Authorized by:   Neena Rhymes MD   Signed by:   Doristine Devoid on 12/09/2009   Method used:   Electronically to        [facility] [id number]* (retail)       [address]       [place], [place]  [address]       Ph: [phone]       Fax: [phone]   RxID:   [id number]

## 2010-08-21 NOTE — Letter (Signed)
Summary: [facility]  [facility]   Imported By: Lanelle Bal 04/16/2010 08:37:09  _____________________________________________________________________  External Attachment:    Type:   Image     Comment:   External Document

## 2010-08-21 NOTE — Miscellaneous (Signed)
  Clinical Lists Changes  Orders: Added new Test order of TLB-Lipid Panel (80061-LIPID) - Signed Added new Test order of TLB-Hepatic/Liver Function Pnl (80076-HEPATIC) - Signed Added new Test order of CXR- 2view (CXR) - Signed

## 2010-08-21 NOTE — Assessment & Plan Note (Signed)
Summary: HAD SURGERY, GOT DBL PNEUMONIA, NEEDS FOLLOWUP///SPH   Vital Signs:  Patient profile:   60 year old female Weight:      153 pounds Pulse rate:   78 / minute BP sitting:   110 / 60  (left arm)  Vitals Entered By: Doristine Devoid (January 16, 2010 1:12 PM) CC: f/u and problems urinary incontinece has been wetting clothes    History of Present Illness: 60 yo woman here today for  1) PNA- had PNA 3 weeks before foot surgery and took Zpack from UC.  then developed bilateral PNA that was detected post-op after calling EMS for SOB.  was admitted to [facility].  was d/c'd from hospital on 6/24.  has completed abx- Avelox.  fever free, no SOB, mild fatigue.  2) urinary incontinence- 'i know the only drugs out there cause constipation and i've had constipation since i was a teenager, i don't want that'.  sxs started 1 year ago.  mixture of stress and urge incontinence.  'i wet my pants, it's pathetic'.  Current Medications (verified): 1)  Simvastatin 20 Mg Tabs (Simvastatin) .Marland Kitchen.. 1 By Mouth Qd 2)  Aciphex 20 Mg Tbec (Rabeprazole Sodium) .Marland Kitchen.. 1 Daily 3)  Effexor Xr 150 Mg Cp24 (Venlafaxine Hcl) .Marland Kitchen.. 1 Daily 4)  Miralax  Powd (Polyethylene Glycol 3350) .... 3 Packet Daily 5)  Multivitamins  Tabs (Multiple Vitamin) .Marland Kitchen.. 1 Daily 6)  Posture-D  Tabs (Ca & Phos-Vit D-Mag Tabs) .... 4 Daily 7)  Mirapex 1 Mg Tabs (Pramipexole Dihydrochloride) .... Take 3 Tablets At Bedtime 8)  Zonegran 100 Mg Caps (Zonisamide) .... 2 Every Am and 3 Every Pm 9)  Gabapentin 600 Mg  Tabs (Gabapentin) .... Take 3 Tabs At Bedtime 10)  Synthroid 137 Mcg Tabs (Levothyroxine Sodium) .... Take One Tablet Daily. 11)  Omnaris 50 Mcg/act Susp (Ciclesonide) .... As Directed 12)  Nabumetone 500 Mg Tabs (Nabumetone) .Marland Kitchen.. 1 By Mouth Two Times A Day Per Pain. 13)  Oxycodone .... As Needed Foot Pain- Dose Unknown  Allergies (verified): 1)  ! Morphine  Past History:  Past Medical  History: Allergy GERD Hypothyroidism Osteopenia Seizure disorder - she is on lifelong medication per neuro and cannot have driving privileges if she stops meds Allergic rhinitis Hyperlipidemia fibromyalgia IBS chronic alcohol abuse - alcoholism RLS sees gyn Depression mixed urinary incontinence  Past Surgical History: 2 c-sections - 1978, 1990 right wrist - benign mass left foot - neuroma, bunion abdominal liposuction tonsils as a child R buionectomy and hammertoe   Review of Systems      See HPI  Physical Exam  General:  Well-developed,well-nourished,in no acute distress; alert,appropriate and cooperative throughout examination Neck:  No deformities, masses, or tenderness noted. Lungs:  Normal respiratory effort, chest expands symmetrically. Lungs are clear to auscultation, no crackles or wheezes. Heart:  Normal rate and regular rhythm. S1 and S2 normal without gallop, murmur, click, rub or other extra sounds. Extremities:  R foot in post-op shoe   Impression & Recommendations:  Problem # 1:  PNEUMONIA (ICD-486) Assessment New  pt completed course of abx.  now sxs free.  needs f/u CXR in 6 weeks.  Orders: T-2 View CXR (71020TC)  Problem # 2:  INCONTINENCE, MIXED, URGE/STRESS (ZOX-096.04) Assessment: New  Enablex samples given (7.5mg ).  will refer to urology.  Orders: Urology Referral (Urology)  Complete Medication List: 1)  Simvastatin 20 Mg Tabs (Simvastatin) .Marland Kitchen.. 1 by mouth qd 2)  Aciphex 20 Mg Tbec (Rabeprazole sodium) .Marland Kitchen.. 1 daily 3)  Effexor Xr 150 Mg Cp24 (Venlafaxine hcl) .Marland Kitchen.. 1 daily 4)  Miralax Powd (Polyethylene glycol 3350) .... 3 packet daily 5)  Multivitamins Tabs (Multiple vitamin) .Marland Kitchen.. 1 daily 6)  Posture-d Tabs (Ca & phos-vit d-mag tabs) .... 4 daily 7)  Mirapex 1 Mg Tabs (Pramipexole dihydrochloride) .... Take 3 tablets at bedtime 8)  Zonegran 100 Mg Caps (Zonisamide) .... 2 every am and 3 every pm 9)  Gabapentin 600 Mg Tabs  (Gabapentin) .... Take 3 tabs at bedtime 10)  Synthroid 137 Mcg Tabs (Levothyroxine sodium) .... Take one tablet daily. 11)  Omnaris 50 Mcg/act Susp (Ciclesonide) .... As directed 12)  Nabumetone 500 Mg Tabs (Nabumetone) .Marland Kitchen.. 1 by mouth two times a day per pain. 13)  Oxycodone  .... As needed foot pain- dose unknown  Patient Instructions: 1)  Please schedule fasting labs to recheck your cholesterol 2)  Hepatic Panel prior to visit ICD-9: 272.4 3)  Lipid panel prior to visit ICD-9 : 272.4 4)  Call and schedule your mammogram 5)  Go to [address] for your CXR in 6 weeks- the order is in the computer 6)  Start the [employer]- 1 daily.  Someone will call you with your Urology appt 7)  Hang in there!!

## 2010-08-21 NOTE — Consult Note (Signed)
Summary: [facility]  [facility]   Imported By: Lanelle Bal 07/23/2010 12:19:36  _____________________________________________________________________  External Attachment:    Type:   Image     Comment:   External Document

## 2010-08-21 NOTE — Letter (Signed)
Summary: [facility]  [facility]   Imported By: Lanelle Bal 07/17/2010 11:33:44  _____________________________________________________________________  External Attachment:    Type:   Image     Comment:   External Document

## 2010-08-21 NOTE — Consult Note (Signed)
Summary: [facility]   Imported By: Lanelle Bal 03/13/2010 11:37:38  _____________________________________________________________________  External Attachment:    Type:   Image     Comment:   External Document

## 2010-08-21 NOTE — Letter (Signed)
Summary: [facility]   Imported By: Lanelle Bal 07/01/2010 08:57:14  _____________________________________________________________________  External Attachment:    Type:   Image     Comment:   External Document

## 2010-08-21 NOTE — Letter (Signed)
   [place] [facility] [address] [place], [place]  [address] Phone: [phone]      September 11, 2009   GIOVANNI BATH [address] [place], [place] [address]  RE:  LAB RESULTS  Dear  Ms. Vialpando,  The following is an interpretation of your most recent lab tests.  Please take note of any instructions provided or changes to medications that have resulted from your lab work.  ELECTROLYTES:  Stable - no changes needed  KIDNEY FUNCTION TESTS:  Good - no changes needed  LIVER FUNCTION TESTS:  Good - no changes needed     CBC:  Good - no changes needed   Sincerely Yours,    Lemont Fillers FNP

## 2010-08-21 NOTE — Consult Note (Signed)
Summary: [facility]  [facility]   Imported By: Lanelle Bal 04/23/2010 08:29:06  _____________________________________________________________________  External Attachment:    Type:   Image     Comment:   External Document

## 2010-08-21 NOTE — Miscellaneous (Signed)
  Clinical Lists Changes  Observations: Added new observation of MAMMOGRAM: normal (05/16/2010 11:19)      Preventive Care Screening  Mammogram:    Date:  05/16/2010    Results:  normal

## 2010-08-21 NOTE — Consult Note (Signed)
Summary: [facility]  [facility]   Imported By: Lanelle Bal 03/14/2010 09:37:23  _____________________________________________________________________  External Attachment:    Type:   Image     Comment:   External Document

## 2010-08-21 NOTE — Progress Notes (Signed)
Summary: PRE-OP CONSULT PAPERWORK TO COMPLETE  Phone Note Call from Patient Call back at Home Phone [phone]   Caller: Patient Summary of Call: PATIENT DROPPED OFF PRE-OP CONSULT FORM FROM [facility]--PLEASE COMPLETE AND FAX ALL PAPERWORK TO [phone]  PATIENT ALSO INCLUDED A BUSINESS CARD WITH HER NEUROLOGIST NAME = Myer Haff, MD---JUST FOR INFO ONLY  SAW DR Laury Axon FOR CPX IN NOV, 2010 ; HAD O/V WITH DR LOWNE IN [facility] 2011----SO I WILL TAKE PAPERWORK TO [facility]   Initial call taken by: Jerolyn Shin,  December 23, 2009 12:10 PM  Follow-up for Phone Call        on your ledge for review. Army Fossa CMA  December 23, 2009 4:18 PM   Additional Follow-up for Phone Call Additional follow up Details #1::        She is Dr Rennis Golden pt---- I feel like she needs surgical clearance Additional Follow-up by: Loreen Freud DO,  December 23, 2009 6:53 PM    Additional Follow-up for Phone Call Additional follow up Details #2::    according to the CPE Dr Laury Axon did and EKGs i reviewed, pt should have no problem w/ surgery.  neuro should clear her from a seizure standpoint. Follow-up by: Neena Rhymes MD,  December 24, 2009 8:16 AM  Additional Follow-up for Phone Call Additional follow up Details #3:: Details for Additional Follow-up Action Taken: information faxed to [facility]........Marland KitchenDoristine Devoid  December 24, 2009 8:30 AM

## 2010-08-21 NOTE — Letter (Signed)
Summary: Results Follow up Letter  [facility]  [address] [place], [place] [address]   Phone: [phone]  Fax: [phone]    02/17/2010 MRN: [id number]  Megan Singleton [address] [place], [place]  [address]  Dear Ms. Corkins,  The following are the results of your recent test(s):  Test         Result    Pap Smear:        Normal _____  Not Normal _____ Comments: ______________________________________________________ Cholesterol: LDL(Bad cholesterol):         Your goal is less than:         HDL (Good cholesterol):       Your goal is more than: Comments:  ______________________________________________________ Mammogram:        Normal _____  Not Normal _____ Comments:  ___________________________________________________________________ Hemoccult:        Normal _____  Not normal _______ Comments:    _____________________________________________________________________ Other Tests:    We routinely do not discuss normal results over the telephone.  If you desire a copy of the results, or you have any questions about this information we can discuss them at your next office visit.   Sincerely,

## 2010-08-21 NOTE — Assessment & Plan Note (Signed)
Summary: pap/spot at end of spine//kn   Vital Signs:  Patient profile:   60 year old female Weight:      148 pounds Pulse rate:   86 / minute BP sitting:   120 / 80  (left arm)  Vitals Entered By: Doristine Devoid CMA (February 10, 2010 3:10 PM) CC: lump on tailbone painful and pap    History of Present Illness: 60 yo woman here today for pap.  lump on tailbone- 1st appeared 6 weeks ago.  improved w/ hydrocortisone cream but recurred.  then used abx coated bandaids which dramatically improved the sxs.  clothing seams will irritate area.  has never drained.  area will swell and shrink.  decreased libido- 'i have no desire for sex'.  'when we do get together it's so freaking painful'.  admits to lack of endogenous lubrication but does use [place].    Allergies (verified): 1)  ! Morphine  Past History:  Past Medical History: Last updated: 01/16/2010 Allergy GERD Hypothyroidism Osteopenia Seizure disorder - she is on lifelong medication per neuro and cannot have driving privileges if she stops meds Allergic rhinitis Hyperlipidemia fibromyalgia IBS chronic alcohol abuse - alcoholism RLS sees gyn Depression mixed urinary incontinence  Review of Systems      See HPI  Physical Exam  General:  Well-developed,well-nourished,in no acute distress; alert,appropriate and cooperative throughout examination Rectal:  pilonidal cyst superior to anus.  no evidence of infxn at this time, no drainage. Genitalia:  Pelvic Exam:        External: normal female genitalia without lesions or masses, + atrophy        Vagina: normal without lesions or masses, + atrophy        Cervix: normal without lesions or masses        Adnexa: normal bimanual exam without masses or fullness        Uterus: normal by palpation        Pap smear: performed   Impression & Recommendations:  Problem # 1:  PILONIDAL CYST (ICD-685.1) Assessment New  no current infxn.  refer to surgery.  Orders: Surgical Referral  (Surgery)  Problem # 2:  VAGINITIS, ATROPHIC (ICD-627.3) Assessment: New likely the cause of her dyspareunia.  start low dose premarin cream- discussed risk of breast cancer and importance of her to weigh risk:benefits.  pt understands.  will refer to GYN for complete discussion. Her updated medication list for this problem includes:    Premarin 0.625 Mg/gm Crea (Estrogens, conjugated) .Marland Kitchen... 0.5 grams daily x1-2 weeks and then decrease to 2x/week.  disp 1 large tube  Problem # 3:  DYSPAREUNIA (ICD-625.0) Assessment: New  see above.  Orders: Gynecologic Referral (Gyn)  Problem # 4:  SCREENING FOR MALIGNANT NEOPLASM OF THE CERVIX (ICD-V76.2) Assessment: New pap collected.  Complete Medication List: 1)  Simvastatin 20 Mg Tabs (Simvastatin) .Marland Kitchen.. 1 by mouth qd 2)  Aciphex 20 Mg Tbec (Rabeprazole sodium) .Marland Kitchen.. 1 daily 3)  Effexor Xr 150 Mg Cp24 (Venlafaxine hcl) .Marland Kitchen.. 1 daily 4)  Miralax Powd (Polyethylene glycol 3350) .... 3 packet daily 5)  Multivitamins Tabs (Multiple vitamin) .Marland Kitchen.. 1 daily 6)  Posture-d Tabs (Ca & phos-vit d-mag tabs) .... 4 daily 7)  Mirapex 1 Mg Tabs (Pramipexole dihydrochloride) .... Take 3 tablets at bedtime 8)  Zonegran 100 Mg Caps (Zonisamide) .... 2 every am and 3 every pm 9)  Gabapentin 600 Mg Tabs (Gabapentin) .... Take 3 tabs at bedtime 10)  Synthroid 137 Mcg Tabs (Levothyroxine sodium) .Marland KitchenMarland KitchenMarland Kitchen  Take one tablet daily. 11)  Omnaris 50 Mcg/act Susp (Ciclesonide) .... As directed 12)  Nabumetone 500 Mg Tabs (Nabumetone) .Marland Kitchen.. 1 by mouth two times a day per pain. 13)  Oxycodone  .... As needed foot pain- dose unknown 14)  Premarin 0.625 Mg/gm Crea (Estrogens, conjugated) .... 0.5 grams daily x1-2 weeks and then decrease to 2x/week.  disp 1 large tube  Patient Instructions: 1)  Schedule your mammogram 2)  Someone will call you with your surgery referral regarding the pilonidal cyst 3)  Start the Premarin cream daily, decrease to 2x/week after 1-2 weeks 4)  Someone  will call you with your GYN referral 5)  Call with any questions or concerns 6)  Hang in there!! Prescriptions: PREMARIN 0.625 MG/GM CREA (ESTROGENS, CONJUGATED) 0.5 grams daily x1-2 weeks and then decrease to 2x/week.  disp 1 large tube  #1 tube x 3   Entered and Authorized by:   Neena Rhymes MD   Signed by:   Neena Rhymes MD on 02/10/2010   Method used:   Electronically to        [facility] [phone]* (retail)       [address]       [place], [place]  [address]       Ph: [phone]       Fax: [phone]   RxID:   [id number]

## 2010-08-21 NOTE — Letter (Signed)
Summary: [facility]  [facility]   Imported By: Lanelle Bal 09/26/2009 09:42:46  _____________________________________________________________________  External Attachment:    Type:   Image     Comment:   External Document

## 2010-08-21 NOTE — Assessment & Plan Note (Signed)
Summary: NAUSEA/NOT FEELING WELL/KDC   Vital Signs:  Patient profile:   60 year old female Weight:      154 pounds BMI:     26.53 Temp:     97.6 degrees F oral Pulse rate:   84 / minute Pulse rhythm:   regular Resp:     16 per minute BP sitting:   126 / 84  (right arm) Cuff size:   regular  Vitals Entered By: Pearletha Furl CMA (September 10, 2009 10:44 AM) CC: room 17  Very fatigued for 1 week. Is Patient Diabetic? No Comments Doesn't feel well generally. Has nausea every morning for 1 1/2 weeks.   Primary Care Provider:  Beverely Singleton  CC:  room 17  Very fatigued for 1 week.Marland Kitchen  History of Present Illness: Megan Singleton is a 60 year old female  who presents with c/o nausea, one episode of vomitting.  These symptoms started 10 days ago.  Denies fever, notes increased stress at home.  Notes 57 year old son with depression recently moved back hom.  Feels HA, fatigue, malaise, needs to go to bed by the middle of the day.  Notes arms and legs are burning- notes that the burning in her arms is not unusual for her.  Notes "flu like symptoms".  Denies risk factors for HIV.  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  Had one episode on 2/4.  Noted increased HA's in the middle of February.   Neurology [facility], [facility])  She requests testing for celiac disease  Depression- symptoms have been worse since her son moved back. (plovsky)    Preventive Screening-Counseling & Management  Alcohol-Tobacco     Smoking Status: quit  Allergies: 1)  ! Morphine  Social History: Smoking Status:  quit  Physical Exam  General:  Depressed appearing female, NAD Head:  + tattoo in right cheek Eyes:  PERRLA Ears:  External ear exam shows no significant lesions or deformities.  Otoscopic examination reveals clear canals, tympanic membranes are intact bilaterally  without bulging, retraction, inflammation or discharge. Hearing is grossly normal bilaterally. Neck:  No deformities, masses, or tenderness noted. Lungs:  Normal respiratory effort, chest expands symmetrically. Lungs are clear to auscultation, no crackles or wheezes. Heart:  Normal rate and regular rhythm. S1 and S2 normal without gallop, murmur, click, rub or other extra sounds. Abdomen:  Bowel sounds positive,abdomen soft and non-tender without masses, organomegaly or hernias noted.   Impression & Recommendations:  Problem # 1:  MALAISE AND FATIGUE (ICD-780.79) Assessment New I suspect that her symptoms are related to depression, however will order baseline labs to help eliminate other cause.  Plan  f/u 1-2 weeks, sooner if symptoms worsen.  Pt instructed to arrange f/u with Dr. Donell Beers Orders: TLB-BMP (Basic Metabolic Panel-BMET) (80048-METABOL) TLB-Hepatic/Liver Function Pnl (80076-HEPATIC) TLB-CBC Platelet - w/Differential (85025-CBCD)  Problem # 2:  SEIZURE DISORDER (ICD-780.39) Assessment: Unchanged Patient instructed to f/u with neuro Her updated medication list for this problem includes:    Zonegran 100 Mg Caps (Zonisamide) .Marland Kitchen... 2 every am and 3 every pm    Gabapentin 600 Mg Tabs (Gabapentin) .Marland Kitchen... Take 3 tabs at bedtime  Problem # 3:  DIARRHEA, CHRONIC (ICD-787.91) Assessment: Comment Only Patient notes foods often lead to diarrhea- requesting sprue panel.   Orders: T-Sprue Panel (Celiac Disease Aby Eval) (83516x3/[address])  Complete Medication List: 1)  Simvastatin 20  Mg Tabs (Simvastatin) .Marland Kitchen.. 1 by mouth qd 2)  Aciphex 20 Mg Tbec (Rabeprazole sodium) .Marland Kitchen.. 1 daily 3)  Effexor Xr 150 Mg Cp24 (Venlafaxine hcl) .Marland Kitchen.. 1 daily 4)  Miralax Powd (Polyethylene glycol 3350) .... 3 packet daily 5)  Multivitamins Tabs (Multiple vitamin) .Marland Kitchen.. 1 daily 6)  Posture-d Tabs (Ca & phos-vit d-mag tabs) .... 4 daily 7)  Requip 3 Mg Tabs (Ropinirole hcl) .... 4 tabs at bedtime 8)   Zonegran 100 Mg Caps (Zonisamide) .... 2 every am and 3 every pm 9)  Gabapentin 600 Mg Tabs (Gabapentin) .... Take 3 tabs at bedtime 10)  Synthroid 137 Mcg Tabs (Levothyroxine sodium) .... Take one tablet daily. 11)  Omnaris 50 Mcg/act Susp (Ciclesonide) .... As directed  Other Orders: Venipuncture ([address]) TLB-TSH (Thyroid Stimulating Hormone) (84443-TSH)  Patient Instructions: 1)  Please schedule a follow-up appointment in 2 weeks. 2)  Call if fever is 101, or if symptoms worsen. 3)  Complete blood work today. 4)  Arrange a follow up appointment with Dr. Donell Beers 5)  Keep appointment with neuro  Current Allergies (reviewed today): ! MORPHINE     Preventive Care Screening  Colonoscopy:    Date:  04/20/2007    Results:  normal

## 2010-08-21 NOTE — Progress Notes (Signed)
Summary: Wants more info  Phone Note Call from Patient Call back at [facility] Phone [phone]   Summary of Call: Patient called to get more detail about test results (i.e. what liver abnormality does she have and why it has never been mentioned). Please advise.  Initial call taken by: Lucious Groves CMA,  April 17, 2010 2:10 PM  Follow-up for Phone Call        the reason it's never been mentioned is b/c this is the first abdominal CT scan she's had.  it appears she has a cyst in her liver and the left lobe appears mildly enlarged- this can have multiple causes (which is why i want her to see the specialist).  the good news is that her liver function tests are fine.  the GI doctors will be able to give her more information once they see her and review her chart. Follow-up by: Neena Rhymes MD,  April 17, 2010 2:23 PM  Additional Follow-up for Phone Call Additional follow up Details #1::        Patient notified. Additional Follow-up by: Lucious Groves CMA,  April 17, 2010 2:36 PM

## 2010-08-21 NOTE — Letter (Signed)
Summary: [facility]  [facility]   Imported By: Lanelle Bal 01/04/2010 08:49:34  _____________________________________________________________________  External Attachment:    Type:   Image     Comment:   External Document

## 2010-08-21 NOTE — Assessment & Plan Note (Signed)
Summary: numbness in right hand//lch   Vital Signs:  Patient profile:   60 year old female Weight:      154 pounds O2 Sat:      93 % on Room air Temp:     97.7 degrees F oral Pulse rate:   85 / minute Pulse rhythm:   regular BP sitting:   122 / 74  (left arm) Cuff size:   regular  Vitals Entered By: Army Fossa CMA (October 04, 2009 1:39 PM)  O2 Flow:  Room air CC: Numbness in right hand, feels heat in her neck and face. x 2 days Had CP a week ago.   History of Present Illness:       This is a 60 year old woman who presents with Chest Pain.  The symptoms began 1 week ago.  The patient reports resting chest pain.  The pain is described as intermittent and dull.  The pain is located in the substernal area.  The pain radiates to the left anterior chest and right anterior chest.  Episodes of chest pain last 1-2 minutes.  The pain is brought on or made worse by any activity.  The pain is relieved or improved with rest.  Pt hx gerd--takes aciphex daily.   No chest pain since.  Pt does have episodes of face getting hot and numbness in R arm.  Pt also with hx of seizures but these episodes are not like her seizures.  Pt has already been in contact with her neurologist.    Allergies: 1)  ! Morphine  Past History:  Past medical, surgical, family and social histories (including risk factors) reviewed for relevance to current acute and chronic problems.  Past Medical History: Reviewed history from 06/10/2009 and no changes required. Allergy GERD Hypothyroidism Osteopenia Seizure disorder - she is on lifelong medication per neuro and cannot have driving privileges if she stops meds Allergic rhinitis Hyperlipidemia fibromyalgia IBS chronic alcohol abuse - alcoholism RLS sees gyn Depression  Past Surgical History: Reviewed history from 02/24/2008 and no changes required. 2 c-sections - 1978, 1990 right wrist - benign mass left foot - neuroma, bunion abdominal  liposuction tonsils as a child  Family History: Reviewed history from 06/10/2009 and no changes required. breast cancer - mother prostate cancer - father hyperlipidemia - father  and mother depression - father F--volvulus  Social History: Reviewed history from 09/05/2008 and no changes required. Occupation:housewife, retired [employer] fundraiser Married 4 children Never Smoked Alcohol use-yes  Review of Systems      See HPI  Physical Exam  General:  Well-developed,well-nourished,in no acute distress; alert,appropriate and cooperative throughout examination Eyes:  pupils equal and pupils round.   Neck:  No deformities, masses, or tenderness noted. Lungs:  Normal respiratory effort, chest expands symmetrically. Lungs are clear to auscultation, no crackles or wheezes. Heart:  Normal rate and regular rhythm. S1 and S2 normal without gallop, murmur, click, rub or other extra sounds. Extremities:  No clubbing, cyanosis, edema, or deformity noted with normal full range of motion of all joints.   Neurologic:  alert & oriented X3 and gait normal.   Skin:  Intact without suspicious lesions or rashes Cervical Nodes:  No lymphadenopathy noted Psych:  Cognition and judgment appear intact. Alert and cooperative with normal attention span and concentration. No apparent delusions, illusions, hallucinations   Impression & Recommendations:  Problem # 1:  CHEST PAIN UNSPECIFIED (ICD-786.50) none today check labs if symptoms return ---pt instructed to go to ER  Orders:  Venipuncture [phone]) TLB-Cardiac Panel [phone]) T-D-Dimer Fibrin Derivatives Quantitive [phone]) TLB-BMP (Basic Metabolic Panel-BMET) (80048-METABOL) TLB-CBC Platelet - w/Differential (85025-CBCD) TLB-TSH (Thyroid Stimulating Hormone) (84443-TSH) TLB-Hepatic/Liver Function Pnl (80076-HEPATIC) TLB-T3, Free (Triiodothyronine) (84481-T3FREE) TLB-T4 (Thyrox), Free [phone]) T-Antinuclear Antib (ANA) [phone]) T-Vitamin  D (25-Hydroxy) (25956-38756) EKG w/ Interpretation (93000)  Complete Medication List: 1)  Simvastatin 20 Mg Tabs (Simvastatin) .Marland Kitchen.. 1 by mouth qd 2)  Aciphex 20 Mg Tbec (Rabeprazole sodium) .Marland Kitchen.. 1 daily 3)  Effexor Xr 150 Mg Cp24 (Venlafaxine hcl) .Marland Kitchen.. 1 daily 4)  Miralax Powd (Polyethylene glycol 3350) .... 3 packet daily 5)  Multivitamins Tabs (Multiple vitamin) .Marland Kitchen.. 1 daily 6)  Posture-d Tabs (Ca & phos-vit d-mag tabs) .... 4 daily 7)  Requip 3 Mg Tabs (Ropinirole hcl) .... 4 tabs at bedtime 8)  Zonegran 100 Mg Caps (Zonisamide) .... 2 every am and 3 every pm 9)  Gabapentin 600 Mg Tabs (Gabapentin) .... Take 3 tabs at bedtime 10)  Synthroid 137 Mcg Tabs (Levothyroxine sodium) .... Take one tablet daily. 11)  Omnaris 50 Mcg/act Susp (Ciclesonide) .... As directed 12)  Nabumetone 500 Mg Tabs (Nabumetone) .Marland Kitchen.. 1 by mouth two times a day per pain.   EKG  Procedure date:  10/04/2009  Findings:      Normal sinus rhythm with rate of:  64   Appended Document: numbness in right hand//lch  Laboratory Results   Urine Tests   Date/Time Reported: October 04, 2009 2:43 PM   Routine Urinalysis   Color: yellow Appearance: Clear Glucose: negative   (Normal Range: Negative) Bilirubin: negative   (Normal Range: Negative) Ketone: negative   (Normal Range: Negative) Spec. Gravity: 1.015   (Normal Range: 1.003-1.035) Blood: negative   (Normal Range: Negative) pH: 6.5   (Normal Range: 5.0-8.0) Protein: negative   (Normal Range: Negative) Urobilinogen: negative   (Normal Range: 0-1) Nitrite: negative   (Normal Range: Negative) Leukocyte Esterace: negative   (Normal Range: Negative)    Comments: Floydene Flock  October 04, 2009 2:44 PM

## 2010-08-21 NOTE — Letter (Signed)
Summary: [facility]  [facility]   Imported By: Lanelle Bal 10/25/2009 12:00:28  _____________________________________________________________________  External Attachment:    Type:   Image     Comment:   External Document

## 2010-08-21 NOTE — Letter (Signed)
Summary: [facility]  [facility]   Imported By: Lanelle Bal 08/14/2009 13:54:28  _____________________________________________________________________  External Attachment:    Type:   Image     Comment:   External Document

## 2010-08-21 NOTE — Letter (Signed)
Summary: [facility]   Imported By: Lanelle Bal 12/20/2009 10:11:22  _____________________________________________________________________  External Attachment:    Type:   Image     Comment:   External Document

## 2010-08-22 NOTE — Letter (Signed)
Summary: [facility]  [facility]   Imported By: Lanelle Bal 06/05/2010 10:42:27  _____________________________________________________________________  External Attachment:    Type:   Image     Comment:   External Document

## 2010-08-26 NOTE — Consult Note (Signed)
  NAMEMAX, ROMANO NO.:  [id number]  MEDICAL RECORD NO.:  [id number]          PATIENT TYPE:  EMS  LOCATION:  ED                           FACILITY:  [facility]  PHYSICIAN:  Eulogio Ditch, MD DATE OF BIRTH:  11-02-1950  DATE OF CONSULTATION:  08/19/2010 DATE OF DISCHARGE:  08/19/2010                                CONSULTATION   HISTORY OF PRESENT ILLNESS:  I saw the patient and reviewed the comprehensive assessment done by the ACT.  This is a 60 year old female with a history of depression who came for a refill for Effexor.  The patient told me that she is followed in the outpatient setting by Dr. Donell Beers, but she was out of the medication for the last 3-5 days and having withdrawal symptoms, feeling dizziness and heaviness in the head. The patient told me that she called the doctor's office, but they told her that an appointment will be after several weeks.  The patient is very logical and goal directed during the interview, not suicidal or homicidal, not internally preoccupied.  SUBSTANCE ABUSE HISTORY:  The patient has a history of drinking alcohol but she told me that it is socially.  She is not abusing alcohol.  PAST MEDICAL HISTORY:  The patient has a history of gastric reflux, hypothyroidism, osteoporosis, seizure disorder, restless legs syndrome.  ALLERGIES:  MORPHINE.  MENTAL STATUS EXAM:  The patient is calm and cooperative during the interview.  Fair eye contact.  Speech is normal in rate, rhythm and volume.  Mood euthymic.  Affect mood congruent.  Thought process logical and goal directed.  Thought content not suicidal or homicidal, not delusional.  Thought perception:  No audiovisual hallucination reported, not internally preoccupied.  Cognition alert, awake and oriented x3. Memory:  Immediate, recent and remote intact.  Attention and concentration good.  Abstraction ability good.  Fund of knowledge fair. Insight and judgment  intact.  DIAGNOSIS:  AXIS I:  As per history, major depressive disorder, recurrent type. AXIS II:  Deferred. AXIS III:  See medical notes. AXIS IV:  Out of medication for the last 3-5 days. AXIS V:  50.  RECOMMENDATIONS: 1. I gave the patient a prescription for Effexor XR 150 mg twice a day     with 1 referral.  I told the patient to follow     up with Dr. Donell Beers or Dr. Evelene Croon.  The patient told me she has an     appointment with Dr. Evelene Croon on March 13. 2. As the patient is not actively suicidal or having psychotic or     manic symptoms, and she does not want to be admitted, the patient     does not meet criteria for admission on IVC at this time.     Eulogio Ditch, MD     SA/MEDQ  D:  08/19/2010  T:  08/19/2010  Job:  [id number]  Electronically Signed by Eulogio Ditch  on 08/26/2010 09:58:04 AM

## 2010-08-27 NOTE — Progress Notes (Signed)
Summary: refill med  Phone Note Refill Request Call back at Home Phone [phone]   Refills Requested: Medication #1:  EFFEXOR XR 150 MG CP24 1 daily Pt left VM that she is currently going thorough withdrawal. Pt current psych will not refill med without f/u appt. Pt does not want to see current psych anymore and has a pending appt schedule with new psych dr Evelene Croon on 08-31-10 and would like to know if dr Beverely Low can refill med until then.Marland KitchenMarland KitchenPls advise...Marland KitchenMarland KitchenFelecia Deloach CMA  August 18, 2010 3:59 PM    Follow-up for Phone Call        yes, we will refill for 1 month, no refills. Follow-up by: Neena Rhymes MD,  August 18, 2010 4:02 PM  Additional Follow-up for Phone Call Additional follow up Details #1::        Left message to call office to confirm pharmacy.Marland KitchenMarland KitchenMarland KitchenFelecia Deloach CMA  August 18, 2010 5:24 PM  Left message to call office ...........Marland KitchenFelecia Deloach CMA  August 19, 2010 8:51 AM   Spoke with PT Rx filled by ED no longer need Rx.  Pt states that she would like a Rx for Anxiety med to get on plane on Friday to flight to [place] for a week. Pt uses [facility]. Pls advise.Marland KitchenMarland KitchenMarland KitchenFelecia Deloach CMA  August 20, 2010 3:41 PM     Additional Follow-up for Phone Call Additional follow up Details #2::    noted Follow-up by: Neena Rhymes MD,  August 20, 2010 4:05 PM

## 2010-10-05 LAB — CBC
HCT: 35.6 % — ABNORMAL LOW (ref 36.0–46.0)
Hemoglobin: 12.2 g/dL (ref 12.0–15.0)
MCH: 31.7 pg (ref 26.0–34.0)
MCV: 92.6 fL (ref 78.0–100.0)
RBC: 3.85 MIL/uL — ABNORMAL LOW (ref 3.87–5.11)

## 2010-10-05 LAB — BASIC METABOLIC PANEL
CO2: 22 mEq/L (ref 19–32)
Chloride: 110 mEq/L (ref 96–112)
GFR calc Af Amer: >60 mL/min (ref >60)
Potassium: 5.2 mEq/L — ABNORMAL HIGH (ref 3.5–5.1)
Sodium: 140 mEq/L (ref 135–145)

## 2010-10-05 LAB — POCT I-STAT 3, ART BLOOD GAS (G3+)
Bicarbonate: 20.3 mEq/L (ref 20.0–24.0)
pH, Arterial: 7.333 — ABNORMAL LOW (ref 7.350–7.400)
pO2, Arterial: 57 mmHg — ABNORMAL LOW (ref 80.0–100.0)

## 2010-10-05 LAB — CULTURE, BLOOD (ROUTINE X 2)

## 2010-10-05 LAB — DIFFERENTIAL
Eosinophils Absolute: 0 10*3/uL (ref 0.0–0.7)
Eosinophils Relative: 0 % (ref 0–5)
Lymphs Abs: 1.2 10*3/uL (ref 0.7–4.0)
Monocytes Relative: 6 % (ref 3–12)
Neutrophils Relative %: 83 % — ABNORMAL HIGH (ref 43–77)

## 2010-10-06 ENCOUNTER — Telehealth: Payer: Self-pay | Admitting: Family Medicine

## 2010-10-07 ENCOUNTER — Telehealth: Payer: Self-pay | Admitting: *Deleted

## 2010-10-07 NOTE — Telephone Encounter (Signed)
Informed pt that this should be a problem but will let Dr. Beverely Low know.

## 2010-10-08 NOTE — Telephone Encounter (Signed)
Ok, will await refill request from pharmacy.

## 2010-10-08 NOTE — Telephone Encounter (Signed)
It will be fine to refill meds until she is able to see psych.  I'm glad she made the appt

## 2010-10-16 ENCOUNTER — Telehealth: Payer: Self-pay | Admitting: Family Medicine

## 2010-10-16 MED ORDER — VENLAFAXINE HCL ER 150 MG PO CP24: 150.0000 mg | ORAL_CAPSULE | Freq: Every day | ORAL | Status: DC

## 2010-10-16 NOTE — Telephone Encounter (Signed)
Please ask pt when her appt w/ Dr Evelene Croon is scheduled.  Ok for #30, 3 refills.

## 2010-10-16 NOTE — Telephone Encounter (Signed)
Left message on voicemail to call the office

## 2010-10-16 NOTE — Progress Notes (Signed)
Summary: call request  Phone Note Call from Patient Call back at Home Phone [phone]   Summary of Call: Patient left message on triage crying, requesting someone call her. No additional info was given. Lucious Groves CMA  October 06, 2010 4:05 PM   I called the patient and she notes that she was referred to psych/conunseling, to a Dr. Lafayette Dragon. Per Beverely Low she was recommended to Dr. Evelene Croon and his office number is [phone]. Patient notes that she googled and went to the wrong place, so she will give them a call. Lucious Groves CMA  October 06, 2010 4:16 PM

## 2010-10-16 NOTE — Telephone Encounter (Signed)
This has not been given by our office and per [facility] the pt is supposed to see Dr. Evelene Croon. Please advise.

## 2010-10-16 NOTE — Telephone Encounter (Signed)
Pt notified and notes that her appt is 11/17/10

## 2010-10-21 ENCOUNTER — Other Ambulatory Visit: Payer: Self-pay | Admitting: *Deleted

## 2010-10-21 ENCOUNTER — Other Ambulatory Visit: Payer: Self-pay | Admitting: Family Medicine

## 2010-10-21 MED ORDER — LEVOTHYROXINE SODIUM 137 MCG PO TABS: 137.0000 ug | ORAL_TABLET | Freq: Every day | ORAL | Status: DC

## 2010-10-21 NOTE — Telephone Encounter (Signed)
TSH was last checked a year ago. Please advise.

## 2010-12-05 NOTE — Assessment & Plan Note (Signed)
[facility]                        [facility] OFFICE NOTE   ICA, DAYE                  MRN:          [id number]  DATE:10/13/2006                            DOB:          04/30/1951    REASON FOR VISIT:  Lower extremity joint aches.   Ms. 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  She states  that she carries very heavy objects including rocks, shovel, etc.  She  reports doing a lot of bending, lifting, and digging.  She reports that  after a long day of work outside in the yard, she has specifically right  knee and ankle pain.  She does have a history of hip pain for many years  but appears to be more pronounced after she works out heavily.  She does  not take any medications specifically for pain but recently saw a  commercial for Celebrex and was wondering if that would be helpful for  her.  She denies any trauma.  She does have a history of  gastroesophageal reflux disease and takes AcipHex.  She has not tried  over-the-counter Tylenol.  She denies any swelling of her joints.  She  denies any morning arthralgias.  She denies previous weight gain or  weight loss.   MEDICATIONS:  1. AcipHex 20 mg daily.  2. Effexor XR 150 mg daily.  3. Fosamax 70 mg weekly.  4. Gabapentin 400 mg daily.  5. Miralax.  6. Multivitamin.  7. Nasacort AQ.  8. Requip 1 mg daily.  9. Synthroid 125 mcg.  10.Zocor 20 mg.  11.Zonegran 100 mg two in the morning and three in the evening.  12.Xyzal one daily.   ALLERGIES:  MORPHINE and TUSSIONEX.   REVIEW OF SYSTEMS:  As per HPI.   OBJECTIVE:  VITAL SIGNS:  Weight 150.6, pulse 74, blood pressure 120/70.  GENERAL:  We have a pleasant female in no acute distress, questions  appropriately.  EXTREMITIES:  Examination of the lower extremities is significant for no  obvious deformity to the knee.  She has full range of  motion.  Anterior  and posterior drawer for both knees were unremarkable.  There was mild  tenderness over the right patella on the superior aspect.  Examination  of the hip is significant for no instability.  She does have full  flexion and extension of the hip.  There is mild discomfort with  external rotation of the right hip.  No noted abnormalities of the  ankle.   IMPRESSION:  A 60 year old female with complaints of right knee, ankle  and hip pain, attributed to extensive landscaping work.   PLAN:  1. I did advise the patient that given the amount of physical activity      she is doing in her landscaping on a daily basis, it is not      unreasonable that she will have aches and pains.  Nonetheless, will      obtain an x-ray of her right knee as well as her hip to rule out  any obvious pathology.  2. I did advise that she could have some arthritis that can amplify      her discomfort and thus would recommend Tylenol Arthritis one to      two every 8 hours, especially while working in her yard.  3. Further recommendations after the x-rays reviewed.     Leanne Chang, M.D.  Electronically Signed    LA/MedQ  DD: 10/13/2006  DT: 10/13/2006  Job #: [id number]

## 2010-12-05 NOTE — H&P (Signed)
NAMESUKAINA, Megan Singleton NO.:  [id number]   MEDICAL RECORD NO.:  [id number]          PATIENT TYPE:  EMS   LOCATION:  [facility]                         FACILITY:  [facility]   PHYSICIAN:  Hollice Espy, M.D.DATE OF BIRTH:  [birth year]   DATE OF ADMISSION:  01/30/2006  DATE OF DISCHARGE:                                HISTORY & PHYSICAL   PRIMARY CARE PHYSICIAN:  Dr. Leodis Sias.   CHIEF COMPLAINT:  Chest pressure.   HISTORY OF PRESENT ILLNESS:  The patient is a 60 year old white female with  a past medical history of severe allergies, questionable neuropathy, who  presents to the emergency room after a 1 day episode of chest pressure.  The  patient tells me she has previously not had any episodes like this but then,  starting last night, she started having intermittent episodes lasting  approximately less than 30 seconds of a chest pressure located mid sternum  with no radiation.  She did, however, have associated shortness of breath.  She had no nausea or vomiting.  No chest pain, although she did report some  tongue numbness.  She also says, however, when asked about any arm numbness,  she said she has been having problems in the past week and some generalized  body numbness in both her arms and legs, and she is not sure why this has  been going on.  She became concerned and came into the emergency room after  being re-directed from the walk-in clinic.  In the walk-in clinic, they  reported some questionable areas of T wave inversion but, when the patient  came into the emergency room for further evaluation, cardiac enzymes were  unremarkable and an EKG done in the emergency room just showed some  nonspecific T wave changes but no ST elevations or depressions.  A chest x-  ray was unremarkable as well.  The patient had lab work done which was also  unremarkable.  Currently, she is at this moment feeling fine.  She has no  complaints.  She denies any headaches, vision  changes, dysphagia, chest  pain, palpitations, shortness of breath, wheeze, cough.  She admits to some  generalized vague abdominal pain but not occurring right this second.  She,  again, complains of some chronic numbness of her arms and legs but, at  times, she gives a history of this occurring for a long period of time.  At  the time, she gives a history of this occurring for the past week.  It is  difficult to get her to be an accurate historian.   REVIEW OF SYSTEMS:  Otherwise negative.   PAST MEDICAL HISTORY:  Includes:  1.  Hypothyroidism.  2.  Severe allergies.  3.  GERD.  4.  Peripheral neuropathy.   MEDICATIONS:  The patient is on:  1.  AcipHex 20 mg p.o. daily.  2.  Zyrtec daily.  3.  Effexor XR 150 mg p.o. daily.  4.  She takes fiber pills 4 times a day.  5.  Fosamax 70 mg p.o. every week.  6.  Neurontin 200 mg in the morning, 400 mg  at night.  7.  Nasacort 2 sprays each nostril b.i.d.  8.  Os-Cal D 500 mg p.o. 4 times a day.  9.  Synthroid 125 mcg p.o. daily.  10. Zonegran 30 mg in the morning and 300 mg at night.  11. Requip 1.5 mg tablets, I believe that is 3 mg total daily.  12. Axert daily p.r.n.  13. Astelin nasal spray daily.   She has received steroid injections lately.   ALLERGIES:  1.  MORPHINE.  2.  TUSSIONEX.  3.  INDERAL.   SOCIAL HISTORY:  She denies any tobacco or drug use.  She is a social  drinker.   FAMILY HISTORY:  Noncontributory.   PHYSICAL EXAMINATION:  VITAL SIGNS:  On admission, temp 97.5, heart rate 60,  blood pressure 135/88, O2 sat 99% on room air, respirations 22.  GENERAL:  The patient is alert and oriented x2, in no apparent distress.  HEENT:  Normocephalic, atraumatic.  Mucous membranes are moist.  She has no  carotid bruits.  HEART:  Regular rate and rhythm.  S1, S2.  LUNGS:  Clear to auscultation bilaterally.  ABDOMEN:  Soft, nontender, nondistended.  Positive bowel sounds.  EXTREMITIES:  No clubbing, cyanosis or  edema.   LABORATORY:  EKG is as per HPI.  Sodium 137, potassium 3.8, chloride 107,  bicarb 24, BUN 20, creatinine 1.1, glucose 86.  CPK 115, MB 6, troponin less  than 0.05.  Second set CPK 91.6, MB 9.4, troponin less than 0.05.   ASSESSMENT AND PLAN:  1.  Chest pressure.  I suspect that likely this is negative; however, with      reports of chest pressure intermittent with associated shortness of      breath, I am concerned and we certainly need to rule out a cardiac      etiology.  We will check 2 more sets of cardiac enzymes.  I will ask      [facility] for a consult for a stress test.  The patient is able      to run on a treadmill.  2.  Peripheral neuropathy.  Given that she has had an extensive workup as an      outpatient, we will just continue her      medication.  3.  Hypothyroidism.  Continue Synthroid.  4.  Depression.  Continue Effexor.      Hollice Espy, M.D.  Electronically Signed     SKK/MEDQ  D:  01/30/2006  T:  01/30/2006  Job:  [id number]   cc:   Kela Millin, M.D.   Francis P. Modesto Charon, M.D.  Fax: [phone]

## 2010-12-05 NOTE — Consult Note (Signed)
Megan Singleton, Megan Singleton NO.:  [id number]   MEDICAL RECORD NO.:  [id number]          PATIENT TYPE:  INP   LOCATION:  3710                         FACILITY:  [facility]   PHYSICIAN:  Peter M. Swaziland, M.D.  DATE OF BIRTH:  09-Jul-1951   DATE OF CONSULTATION:  01/31/2006  DATE OF DISCHARGE:                                   CONSULTATION   HISTORY OF PRESENT ILLNESS:  [name] is a 60 year old white female  admitted yesterday for evaluation of multiple somatic complaints including  chest pain.  Her cardiac enzymes have been slightly abnormal.  She has no  prior cardiac history or workup.  She describes symptoms of mid sternal  chest pain, described as a pressure sensation typically lasting less than  one minute in time.  There is no rhyme or reason to her symptoms.  There are  no clear aggravating or alleviating factors.  She also complains of  headache, nausea, vomiting, numbness all over, dental pain, blurred vision,  and abdominal discomfort.  The patient has had no palpitations or syncope.  She has no cardiac risk factors.   PAST MEDICAL HISTORY:  1.  GERD.  2.  History of digestive disorder.  3.  Seizure disorder.  4.  Hypothyroidism.  5.  History of allergies.  6.  Depression.  7.  Migraine headaches.  8.  Restless leg syndrome.  9.  Chronic pain syndrome.  10. Osteoporosis.  11. History of prior foot surgery.  12. History of C-section.   ALLERGIES:  INDERAL, MORPHINE, and TUSSIONEX.   CURRENT MEDICATIONS:  1.  AcipHex 20 mg per day.  2.  Zyrtec daily 10 mg.  3.  Effexor XR 150 mg per day.  4.  Fiber supplement four times per day.  5.  Fosamax 70 mg weekly.  6.  Neurontin 400 mg two tablets in the morning, four tablets in the      evening.  7.  Calcium plus D four tablets daily.  8.  Synthroid 125 mcg daily.  9.  Requip one and half tablets daily.  10. Depo-Medrol 180 mg.  11. Zonegran oral 100 mg two tablets in the morning and three in the  evening.   SOCIAL HISTORY:  The patient is a nonsmoker.  She rarely drinks alcohol.  She is married.   FAMILY HISTORY AND SOCIAL HISTORY:  No immediate family history of coronary  disease.  Her grandfather had a stroke   REVIEW OF SYSTEMS:  As noted in HPI, otherwise negative.   PHYSICAL EXAMINATION:  GENERAL:  The patient is a well-developed white  female in no distress.  VITAL SIGNS:  Blood pressure is 117/82, pulse 66 and regular, afebrile. Sats  are 100% on room air.  HEENT:  Normocephalic, atraumatic.  Pupils equal, round, reactive.  Conjunctiva clear.  Oropharynx is clear.  NECK:  Without JVD, adenopathy, thyromegaly or bruits.  LUNGS: Clear.  CARDIAC:  Regular rate and rhythm without gallop, murmur, rub or click.  ABDOMEN:  Soft, nontender.  She has no hepatosplenomegaly, masses or bruits.  Femoral and pedal pulses are 2+ and symmetric.  NEUROLOGIC:  Nonfocal.   LABORATORY DATA:  ECG shows normal sinus rhythm, normal ECG.  Chest x-ray is  normal. Her pH is 7.44, pCO2 of 34, bicarbonate of 23.  Sodium 137,  potassium 3.8, chloride 107, CO2 86, BUN 20, creatinine 1.1, hemoglobin is  15.6.  Point of care, CPK-MBs were 6.0, 9.4, and 5.9.  Subsequent total CPK-  MBs are 341 with 5.8 MB and 328 with 7.3 MB.  Point of care troponins are  less than 0.05 x3.  Subsequent troponins were 0.06 and 0.06.   IMPRESSION:  1.  Atypical chest pain.  2.  Borderline elevated cardiac enzymes.  3.  Multiple somatic complaints.  4.  Seizure disorder.  5.  Depression.  6.  Hypothyroidism.   PLAN:  I think that her cardiac risk is probably low given her lack of risk  factors and normal ECG.  However, given her symptoms of chest pain and  borderline elevated cardiac enzymes, I would recommend further evaluation,  would recommend a stress Cardiolite study to rule out ischemic heart  disease.  Would also recommend echocardiogram to assess her left ventricular  and valvular function.            ______________________________  Peter M. [place], M.D.     PMJ/MEDQ  D:  01/31/2006  T:  01/31/2006  Job:  [id number]

## 2010-12-17 ENCOUNTER — Other Ambulatory Visit: Payer: Self-pay | Admitting: Family Medicine

## 2010-12-17 NOTE — Telephone Encounter (Signed)
Refill sent and CPX reminder mailed to pt.

## 2011-01-02 ENCOUNTER — Encounter: Payer: Self-pay | Admitting: Family Medicine

## 2011-01-06 ENCOUNTER — Encounter: Payer: Self-pay | Admitting: Family Medicine

## 2011-01-06 ENCOUNTER — Ambulatory Visit (INDEPENDENT_AMBULATORY_CARE_PROVIDER_SITE_OTHER): Payer: PRIVATE HEALTH INSURANCE | Admitting: Family Medicine

## 2011-01-06 DIAGNOSIS — E785 Hyperlipidemia, unspecified: Secondary | ICD-10-CM

## 2011-01-06 DIAGNOSIS — M899 Disorder of bone, unspecified: Secondary | ICD-10-CM

## 2011-01-06 DIAGNOSIS — E039 Hypothyroidism, unspecified: Secondary | ICD-10-CM

## 2011-01-06 DIAGNOSIS — Z0000 Encounter for general adult medical examination without abnormal findings: Secondary | ICD-10-CM

## 2011-01-06 LAB — HEPATIC FUNCTION PANEL: Total Bilirubin: 0.4 mg/dL (ref 0.3–1.2)

## 2011-01-06 LAB — TSH: TSH: 0.15 u[IU]/mL — ABNORMAL LOW (ref 0.35–5.50)

## 2011-01-06 LAB — CBC WITH DIFFERENTIAL/PLATELET
Basophils Relative: 0.4 % (ref 0.0–3.0)
Eosinophils Absolute: 0.1 10*3/uL (ref 0.0–0.7)
Lymphs Abs: 2.6 10*3/uL (ref 0.7–4.0)
MCHC: 34.1 g/dL (ref 30.0–36.0)
MCV: 92.9 fl (ref 78.0–100.0)
Monocytes Absolute: 0.4 10*3/uL (ref 0.1–1.0)
Neutro Abs: 4 10*3/uL (ref 1.4–7.7)
Neutrophils Relative %: 56.7 % (ref 43.0–77.0)
RBC: 4.36 Mil/uL (ref 3.87–5.11)

## 2011-01-06 LAB — BASIC METABOLIC PANEL
BUN: 20 mg/dL (ref 6–23)
Calcium: 9.6 mg/dL (ref 8.4–10.5)
Creatinine, Ser: 0.6 mg/dL (ref 0.4–1.2)
GFR: 100.73 mL/min (ref >60.00)
Potassium: 3.9 mEq/L (ref 3.5–5.1)

## 2011-01-06 LAB — LIPID PANEL
Cholesterol: 173 mg/dL (ref 0–200)
HDL: 71 mg/dL (ref >39.00)
VLDL: 13.6 mg/dL (ref 0.0–40.0)

## 2011-01-06 NOTE — Patient Instructions (Signed)
Follow up in 6 months to recheck cholesterol and thyroid We'll notify you of your lab results Schedule your mammogram when it is due Call with any questions or concerns Have a great summer!!!

## 2011-01-06 NOTE — Progress Notes (Signed)
  Subjective:    Patient ID: Megan Singleton, female    DOB: April 21, 1951, 60 y.o.   MRN: [id number]  HPI CPE- had pap last year, UTD on mammogram and colonoscopy.  No concerns today.  PsychEvelene Croon   Review of Systems Patient reports no vision/ hearing changes, adenopathy,fever, weight change,  persistant/recurrent hoarseness , swallowing issues, chest pain, palpitations, edema, persistant/recurrent cough, hemoptysis, dyspnea (rest/exertional/paroxysmal nocturnal), gastrointestinal bleeding (melena, rectal bleeding), abdominal pain, significant heartburn, bowel changes, GU symptoms (dysuria, hematuria, incontinence), Gyn symptoms (abnormal  bleeding, pain),  syncope, focal weakness, memory loss, numbness & tingling, skin/hair/nail changes, abnormal bruising or bleeding, anxiety, or depression.     Objective:   Physical Exam  General Appearance:    Alert, cooperative, no distress, appears stated age  Head:    Normocephalic, without obvious abnormality, atraumatic  Eyes:    PERRL, conjunctiva/corneas clear, EOM's intact, fundi    benign, both eyes  Ears:    Normal TM's and external ear canals, both ears  Nose:   Nares normal, septum midline, mucosa normal, no drainage    or sinus tenderness  Throat:   Lips, mucosa, and tongue normal; teeth and gums normal  Neck:   Supple, symmetrical, trachea midline, no adenopathy;    Thyroid: no enlargement/tenderness/nodules  Back:     Symmetric, no curvature, ROM normal, no CVA tenderness  Lungs:     Clear to auscultation bilaterally, respirations unlabored  Chest Wall:    No tenderness or deformity   Heart:    Regular rate and rhythm, S1 and S2 normal, no murmur, rub   or gallop  Breast Exam:    Deferred  Abdomen:     Soft, non-tender, bowel sounds active all four quadrants,    no masses, no organomegaly  Genitalia:    Deferred- exam done last year  Rectal:    Deferred  Extremities:   Extremities normal, atraumatic, no cyanosis or edema  Pulses:    2+ and symmetric all extremities  Skin:   Skin color, texture, turgor normal, no rashes or lesions  Lymph nodes:   Cervical, supraclavicular, and axillary nodes normal  Neurologic:   CNII-XII intact, normal strength, sensation and reflexes    throughout          Assessment & Plan:

## 2011-01-08 LAB — T3, FREE: T3, Free: 3.1 pg/mL (ref 2.3–4.2)

## 2011-01-09 ENCOUNTER — Telehealth: Payer: Self-pay | Admitting: *Deleted

## 2011-01-09 DIAGNOSIS — R7989 Other specified abnormal findings of blood chemistry: Secondary | ICD-10-CM

## 2011-01-09 NOTE — Telephone Encounter (Signed)
Discuss with patient, awaiting appt info.

## 2011-01-09 NOTE — Telephone Encounter (Signed)
Message copied by Verdene Rio on Fri Jan 09, 2011  5:09 PM ------      Message from: Sheliah Hatch      Created: Thu Jan 08, 2011  4:59 PM       T3 and T4 are normal despite low TSH x2.  Will need to refer to Endo for abnormal TSH.

## 2011-01-11 NOTE — Assessment & Plan Note (Signed)
Pt's PE WNL  UTD on health maintenance.  Anticipatory guidance provided. 

## 2011-01-11 NOTE — Assessment & Plan Note (Signed)
Check labs.  Adjust meds prn  

## 2011-01-11 NOTE — Assessment & Plan Note (Signed)
Check Vit D level- replete prn. 

## 2011-01-11 NOTE — Assessment & Plan Note (Signed)
Check labs.  Make adjustments prn.

## 2011-01-14 ENCOUNTER — Other Ambulatory Visit: Payer: Self-pay | Admitting: Family Medicine

## 2011-01-14 NOTE — Telephone Encounter (Signed)
Refill sent.

## 2011-02-05 ENCOUNTER — Encounter: Payer: Self-pay | Admitting: Endocrinology

## 2011-02-05 ENCOUNTER — Ambulatory Visit (INDEPENDENT_AMBULATORY_CARE_PROVIDER_SITE_OTHER): Payer: PRIVATE HEALTH INSURANCE | Admitting: Endocrinology

## 2011-02-05 VITALS — BP 120/70 | HR 89 | Temp 98.7°F | Ht 63.0 in | Wt 150.2 lb

## 2011-02-05 DIAGNOSIS — E039 Hypothyroidism, unspecified: Secondary | ICD-10-CM

## 2011-02-05 MED ORDER — LEVOTHYROXINE SODIUM 137 MCG PO TABS: 137.0000 ug | ORAL_TABLET | Freq: Every day | ORAL | Status: DC

## 2011-02-05 NOTE — Progress Notes (Signed)
Subjective:    Patient ID: Megan Singleton, female    DOB: [birth year], 60 y.o.   MRN: [id number]  HPI Pt says she developed primary hypothyroidism at age 40, and has been on synthroid ever since then.  Synthroid was increased from 125 to 137 mcg/day, apparently in early 2011.  Symptomatically, she reports 6-9 mos of moderate cold intolerance, worst at the feet, but no assoc numbness. Past Medical History  Diagnosis Date  . GERD (gastroesophageal reflux disease)   . Allergy   . Thyroid disease   . Osteopenia   . Seizure disorder     she is on life long medication per Neuro and can not have driving privileges if she  stops meds  . Hyperlipidemia   . Fibromyalgia   . IBS (irritable bowel syndrome)   . Alcohol abuse   . RLS (restless legs syndrome)   . Depression   . Hashimoto's thyroiditis     Past Surgical History  Procedure Date  . Cesarean section 1191,4782    x's 2  . Wrist surgery     Right wrist---Benign mass  . Foot surgery     Left--Neuroma, Bunion  . Liposuction     abdominal  . Tonsillectomy     as a child  . Bunionectomy     Right and Hammertoe    History   Social History  . Marital Status: Married    Spouse Name: N/A    Number of Children: N/A  . Years of Education: N/A   Occupational History  . Not on file.   Social History Main Topics  . Smoking status: Former Smoker    Quit date: 07/21/1979  . Smokeless tobacco: Not on file  . Alcohol Use: Yes     2 drinks per night  . Drug Use: No  . Sexually Active: Not on file   Other Topics Concern  . Not on file   Social History Narrative  . No narrative on file    Current Outpatient Prescriptions on File Prior to Visit  Medication Sig Dispense Refill  . ACIPHEX 20 MG tablet TAKE 1 TABLET BY MOUTH EVERY DAY  30 tablet  6  . aspirin 81 MG tablet Take 81 mg by mouth daily.        Marland Kitchen gabapentin (NEURONTIN) 600 MG tablet Take 1,200 mg by mouth at bedtime.       Marland Kitchen levothyroxine (SYNTHROID,  LEVOTHROID) 137 MCG tablet Take 137 mcg by mouth daily.  30 tablet  6  . Multiple Vitamin (MULTIVITAMIN) tablet Take 1 tablet by mouth daily.        . polyethylene glycol powder (GLYCOLAX/MIRALAX) powder Take 21 g by mouth daily.        . pramipexole (MIRAPEX) 1 MG tablet Take 3 mg by mouth at bedtime.        . simvastatin (ZOCOR) 20 MG tablet Take 20 mg by mouth at bedtime.        Marland Kitchen venlafaxine (EFFEXOR XR) 150 MG 24 hr capsule Take 1 capsule (150 mg total) by mouth daily.  30 capsule  3  . venlafaxine (EFFEXOR) 75 MG tablet Take 75 mg by mouth daily.        Marland Kitchen zonisamide (ZONEGRAN) 100 MG capsule Take 100 mg by mouth as directed. 2 tabs q AM and 4 tabs qhs        Allergies  Allergen Reactions  . Morphine     Family History  Problem Relation Age of Onset  .  Breast cancer Mother   . Hyperlipidemia Mother   . Prostate cancer Father   . Hyperlipidemia Father   . Depression Father   . Cancer Cousin     Colon Cancer  . Diabetes Other     Maternal Grandparent  . Kidney disease Other     Maternal Grandparent  . Stroke Other     Grandparent  . Heart disease Other     Grand Parent   BP 120/70  Pulse 89  Temp(Src) 98.7 F (37.1 C) (Oral)  Ht 5\' 3"  (1.6 m)  Wt 150 lb 3.2 oz (68.13 kg)  BMI 26.61 kg/m2  SpO2 96%  Review of Systems denies hair loss, cramps, sob, weight gain, easy bruising, rhinorrhe, blurry vision, myalgias, dry skin, and syncope.  Her last seizure was approx 17 years ago.  She has intermittent depression, constipation, and difficulty with concentration.   Objective:   Physical Exam VS: see vs page GEN: no distress HEAD: head: no deformity eyes: no periorbital swelling, no proptosis external nose and ears are normal mouth: no lesion seen NECK: supple, thyroid is not enlarged CHEST WALL: no deformity CV: reg rate and rhythm, no murmur ABD: abdomen is soft, nontender.  no hepatosplenomegaly.  not distended.  no hernia MUSCULOSKELETAL: muscle bulk and strength  are grossly normal.  no obvious joint swelling.  gait is normal and steady EXTEMITIES: no deformity.  no ulcer on the feet.  feet are of normal color and temp.  Trace bilat leg edema PULSES: dorsalis pedis intact bilat.  no carotid bruit. NEURO:  cn 2-12 grossly intact.   readily moves all 4's.  sensation is intact to touch on the feet SKIN:  Normal texture and temperature.  No rash or suspicious lesion is visible.   NODES:  None palpable at the neck PSYCH: alert, oriented x3.  Does not appear anxious nor depressed.    Lab Results  Component Value Date   TSH 0.15* 01/06/2011  (i have reviewed tsh values from 2008-2012).  It was normal until 2011.  Assessment & Plan:  Primary hypothyroidism, overreplaced Depression.  This limits interpretation of sxs. Cold intolerance, not thyroid-related

## 2011-02-05 NOTE — Patient Instructions (Signed)
Reduce levothyroxine to 112 mcg/day. Go to lab in 4-6 weeks for another thyroid blood test.  Then please call [phone] to hear your test results.  You will be prompted to enter the 9-digit "MRN" number that appears at the top left of this page, followed by #.  Then you will hear the message. Return here as needed.

## 2011-02-06 ENCOUNTER — Telehealth: Payer: Self-pay | Admitting: *Deleted

## 2011-02-06 NOTE — Telephone Encounter (Signed)
Pt states that Rx to pharmacy was incorrectly ordered for ; lowered to 112 mcg at 02/05/11 OV Confirmed change in dose from OV notes; called pharmacy to correct. Pt informed.

## 2011-02-24 ENCOUNTER — Other Ambulatory Visit: Payer: Self-pay | Admitting: *Deleted

## 2011-02-25 ENCOUNTER — Other Ambulatory Visit: Payer: Self-pay | Admitting: Gastroenterology

## 2011-03-18 ENCOUNTER — Other Ambulatory Visit: Payer: Self-pay | Admitting: Family Medicine

## 2011-03-18 MED ORDER — SIMVASTATIN 20 MG PO TABS: 20.0000 mg | ORAL_TABLET | Freq: Every day | ORAL | Status: DC

## 2011-03-18 NOTE — Telephone Encounter (Signed)
Request for simastatin 20 mg tabs.  Pt last chol lab 03-2010.  Needs office visit and fasting chol panel done.  Will only send 30 day supply and then pt will need appt before med runs out. Megan Newcomer, LPN [facility]

## 2011-03-19 ENCOUNTER — Other Ambulatory Visit: Payer: Self-pay | Admitting: Family Medicine

## 2011-03-19 MED ORDER — PNEUMOCOCCAL VAC POLYVALENT 25 MCG/0.5ML IJ INJ: 0.5000 mL | INJECTION | Freq: Once | INTRAMUSCULAR | Status: DC

## 2011-03-19 MED ORDER — ZOSTER VACCINE LIVE 19400 UNT/0.65ML ~~LOC~~ SOLR: 0.6500 mL | Freq: Once | SUBCUTANEOUS | Status: DC

## 2011-04-06 ENCOUNTER — Encounter: Payer: Self-pay | Admitting: Family Medicine

## 2011-04-15 ENCOUNTER — Other Ambulatory Visit: Payer: Self-pay | Admitting: Family Medicine

## 2011-04-24 LAB — URINALYSIS, ROUTINE W REFLEX MICROSCOPIC
Bilirubin Urine: NEGATIVE
Glucose, UA: NEGATIVE
Ketones, ur: NEGATIVE
Nitrite: NEGATIVE
Specific Gravity, Urine: 1.022
pH: 8

## 2011-04-24 LAB — DIFFERENTIAL
Eosinophils Absolute: 0
Eosinophils Relative: 0
Lymphs Abs: 2.1
Monocytes Relative: 6

## 2011-04-24 LAB — I-STAT 8, (EC8 V) (CONVERTED LAB)
BUN: 17
Bicarbonate: 24.3 — ABNORMAL HIGH
Glucose, Bld: 105 — ABNORMAL HIGH
Sodium: 142
TCO2: 25
pH, Ven: 7.428 — ABNORMAL HIGH

## 2011-04-24 LAB — URINE CULTURE: Culture: NO GROWTH

## 2011-04-24 LAB — CBC
HCT: 43.5
MCV: 90.2
Platelets: 315
WBC: 9.1

## 2011-04-24 LAB — POCT I-STAT CREATININE: Operator id: 265201

## 2011-04-24 LAB — URINE MICROSCOPIC-ADD ON

## 2011-05-03 ENCOUNTER — Other Ambulatory Visit: Payer: Self-pay | Admitting: Endocrinology

## 2011-05-04 ENCOUNTER — Other Ambulatory Visit: Payer: Self-pay | Admitting: Family Medicine

## 2011-05-04 NOTE — Telephone Encounter (Signed)
Rx'es sent to pharmacy 03/19/11. No refill required

## 2011-05-20 ENCOUNTER — Ambulatory Visit (INDEPENDENT_AMBULATORY_CARE_PROVIDER_SITE_OTHER): Payer: PRIVATE HEALTH INSURANCE | Admitting: Family Medicine

## 2011-05-20 ENCOUNTER — Encounter: Payer: Self-pay | Admitting: Family Medicine

## 2011-05-20 DIAGNOSIS — R5381 Other malaise: Secondary | ICD-10-CM

## 2011-05-20 DIAGNOSIS — E039 Hypothyroidism, unspecified: Secondary | ICD-10-CM

## 2011-05-20 NOTE — Patient Instructions (Signed)
We'll notify you of your lab results and make any med changes if needed This may be related to a viral illness- the blood work will tell us Call with any questions or concerns Hang in there!!!!

## 2011-05-20 NOTE — Progress Notes (Signed)
  Subjective:    Patient ID: Megan Singleton, female    DOB: Sep 27, 1950, 60 y.o.   MRN: [id number]  HPI Thyroid- reports 'i'm hot one minute, cold the next'.  Decreased appetite.  Reports she was sick last week.  sxs present 'for a couple weeks at least'.  Taking synthroid - this was decreased in July.  No fever.  Denies sinus pain/pressure, ear pain, cough.  This feels different than when pt was initially having thyroid problems.  Recently had effexor increased.   Review of Systems For ROS see HPI     Objective:   Physical Exam  Vitals reviewed. Constitutional: She appears well-developed and well-nourished. No distress.  HENT:  Head: Normocephalic and atraumatic.  Right Ear: Tympanic membrane normal.  Left Ear: Tympanic membrane normal.  Nose: Mucosal edema and rhinorrhea present. Right sinus exhibits no maxillary sinus tenderness and no frontal sinus tenderness. Left sinus exhibits no maxillary sinus tenderness and no frontal sinus tenderness.  Mouth/Throat: Mucous membranes are normal. Posterior oropharyngeal erythema (w/ PND) present.  Eyes: Conjunctivae and EOM are normal. Pupils are equal, round, and reactive to light.  Neck: Normal range of motion. Neck supple. No thyromegaly present.  Cardiovascular: Normal rate, regular rhythm and normal heart sounds.   Pulmonary/Chest: Effort normal and breath sounds normal. No respiratory distress. She has no wheezes. She has no rales.  Lymphadenopathy:    She has no cervical adenopathy.          Assessment & Plan:

## 2011-05-21 LAB — HEPATIC FUNCTION PANEL
Bilirubin, Direct: 0.1 mg/dL (ref 0.0–0.3)
Total Bilirubin: 0.5 mg/dL (ref 0.3–1.2)

## 2011-05-21 LAB — CBC WITH DIFFERENTIAL/PLATELET
Basophils Absolute: 0 10*3/uL (ref 0.0–0.1)
Eosinophils Absolute: 0.1 10*3/uL (ref 0.0–0.7)
HCT: 41.8 % (ref 36.0–46.0)
Hemoglobin: 14.2 g/dL (ref 12.0–15.0)
Lymphs Abs: 3.1 10*3/uL (ref 0.7–4.0)
MCHC: 34 g/dL (ref 30.0–36.0)
Neutro Abs: 5 10*3/uL (ref 1.4–7.7)
Platelets: 233 10*3/uL (ref 150.0–400.0)
RDW: 13.5 % (ref 11.5–14.6)

## 2011-05-21 LAB — BASIC METABOLIC PANEL
Calcium: 9.3 mg/dL (ref 8.4–10.5)
Creatinine, Ser: 0.8 mg/dL (ref 0.4–1.2)
GFR: 82.51 mL/min (ref >60.00)
Sodium: 142 mEq/L (ref 135–145)

## 2011-05-22 ENCOUNTER — Telehealth: Payer: Self-pay | Admitting: *Deleted

## 2011-05-22 NOTE — Telephone Encounter (Signed)
My guess this is for a prior Berkley Harvey- will wait for [facility]

## 2011-05-22 NOTE — Telephone Encounter (Signed)
Left message to call office to see if Pt has tried any  Other PPI OTC or Rx.

## 2011-05-22 NOTE — Telephone Encounter (Signed)
Pt called about the question for taking any OTC PPI. Pt noted has not taken any kind. Pt noted she is taking Aciphex. Fesol 45mg  half a tablet.

## 2011-05-24 NOTE — Assessment & Plan Note (Signed)
Most likely due to viral illness as pt reports she was recently 'sick'.  Check labs to r/o current infxn.  Reviewed supportive care and red flags that should prompt return.  Pt expressed understanding and is in agreement w/ plan.

## 2011-05-24 NOTE — Assessment & Plan Note (Signed)
sxs do not sound consistent w/ thyroid but will check labs to be sure given recent dose adjustment.

## 2011-05-25 NOTE — Telephone Encounter (Signed)
PA faxed back awaiting response. 

## 2011-05-27 MED ORDER — PANTOPRAZOLE SODIUM 40 MG PO TBEC: 40.0000 mg | DELAYED_RELEASE_TABLET | Freq: Every day | ORAL | Status: DC

## 2011-05-27 NOTE — Telephone Encounter (Signed)
Discuss with patient, Rx sent in to pharmacy 

## 2011-05-27 NOTE — Telephone Encounter (Signed)
Left message to call office

## 2011-05-27 NOTE — Telephone Encounter (Signed)
Prior auth denied coverage excludes coverage for non-Medically Necessary services as follows. Tier 1 formulary agents includes Prilosec, protonix, zegerid and prevacid are available without authorization.

## 2011-05-27 NOTE — Telephone Encounter (Signed)
Can switch to Protonix

## 2011-06-03 ENCOUNTER — Encounter: Payer: Self-pay | Admitting: Family Medicine

## 2011-06-09 ENCOUNTER — Other Ambulatory Visit: Payer: Self-pay | Admitting: Endocrinology

## 2011-07-09 ENCOUNTER — Ambulatory Visit (INDEPENDENT_AMBULATORY_CARE_PROVIDER_SITE_OTHER): Payer: PRIVATE HEALTH INSURANCE | Admitting: Family Medicine

## 2011-07-09 ENCOUNTER — Encounter: Payer: Self-pay | Admitting: Family Medicine

## 2011-07-09 VITALS — BP 115/70 | HR 66 | Temp 97.9°F | Ht 64.5 in | Wt 144.4 lb

## 2011-07-09 DIAGNOSIS — E785 Hyperlipidemia, unspecified: Secondary | ICD-10-CM

## 2011-07-09 LAB — HEPATIC FUNCTION PANEL
ALT: 18 U/L (ref 0–35)
AST: 21 U/L (ref 0–37)
Alkaline Phosphatase: 94 U/L (ref 39–117)
Bilirubin, Direct: 0 mg/dL (ref 0.0–0.3)
Total Bilirubin: 0.3 mg/dL (ref 0.3–1.2)

## 2011-07-09 LAB — LIPID PANEL
LDL Cholesterol: 77 mg/dL (ref 0–99)
Total CHOL/HDL Ratio: 2
Triglycerides: 108 mg/dL (ref 0.0–149.0)

## 2011-07-09 NOTE — Progress Notes (Signed)
  Subjective:    Patient ID: Megan Singleton, female    DOB: October 20, 1950, 60 y.o.   MRN: [id number]  HPI Hyperlipidemia- chronic problem.  On simvastatin.  Denies abd pain, N/V, myalgias.   Review of Systems For ROS see HPI     Objective:   Physical Exam  Constitutional: She is oriented to person, place, and time. She appears well-developed and well-nourished. No distress.  HENT:  Head: Normocephalic and atraumatic.  Eyes: Conjunctivae and EOM are normal. Pupils are equal, round, and reactive to light.  Neck: Normal range of motion. Neck supple. No thyromegaly present.  Cardiovascular: Normal rate, regular rhythm, normal heart sounds and intact distal pulses.   No murmur heard. Pulmonary/Chest: Effort normal and breath sounds normal. No respiratory distress.  Abdominal: Soft. She exhibits no distension. There is no tenderness.  Musculoskeletal: She exhibits no edema.  Lymphadenopathy:    She has no cervical adenopathy.  Neurological: She is alert and oriented to person, place, and time.  Skin: Skin is warm and dry.  Psychiatric: She has a normal mood and affect. Her behavior is normal.          Assessment & Plan:

## 2011-07-09 NOTE — Assessment & Plan Note (Signed)
Chronic problem.  Tolerating statin w/out difficulty.  Check labs.  Adjust meds prn  

## 2011-07-09 NOTE — Patient Instructions (Signed)
Schedule your complete physical in June We'll notify you of your lab results and make any changes if needed Keep up the good work!  You look great! Call with any questions or concerns Happy Holidays!!

## 2011-07-15 ENCOUNTER — Encounter: Payer: Self-pay | Admitting: Internal Medicine

## 2011-07-15 ENCOUNTER — Ambulatory Visit (INDEPENDENT_AMBULATORY_CARE_PROVIDER_SITE_OTHER): Payer: PRIVATE HEALTH INSURANCE | Admitting: Internal Medicine

## 2011-07-15 ENCOUNTER — Ambulatory Visit: Payer: PRIVATE HEALTH INSURANCE | Admitting: Internal Medicine

## 2011-07-15 VITALS — BP 124/70 | HR 79 | Temp 98.0°F | Resp 18

## 2011-07-15 DIAGNOSIS — H109 Unspecified conjunctivitis: Secondary | ICD-10-CM

## 2011-07-15 MED ORDER — POLYMYXIN B-TRIMETHOPRIM 10000-0.1 UNIT/ML-% OP SOLN: 1.0000 [drp] | OPHTHALMIC | Status: DC

## 2011-07-16 ENCOUNTER — Encounter: Payer: Self-pay | Admitting: Internal Medicine

## 2011-07-16 DIAGNOSIS — H109 Unspecified conjunctivitis: Secondary | ICD-10-CM | POA: Insufficient documentation

## 2011-07-16 NOTE — Progress Notes (Signed)
  Subjective:    Patient ID: Megan Singleton, female    DOB: 1951-03-28, 60 y.o.   MRN: [id number]  HPI Pt presents to clinic for evaluation of possible conjunctivitis. Notes 3d h/o bilateral eye discharge and film without decrease in visual acuity or pain. Sx's occurred after exposure to a cat she is caring for developed conjunctivitis. 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 No other associated sx's denying f/c or URI sx's. No alleviating or exacerbating factors. No other complaints.  Past Medical History  Diagnosis Date  . GERD (gastroesophageal reflux disease)   . Allergy   . Thyroid disease   . Osteopenia   . Seizure disorder     she is on life long medication per Neuro and can not have driving privileges if she  stops meds  . Hyperlipidemia   . Fibromyalgia   . IBS (irritable bowel syndrome)   . Alcohol abuse   . RLS (restless legs syndrome)   . Depression   . Hashimoto's thyroiditis    Past Surgical History  Procedure Date  . Cesarean section [id number]    x's 2  . Wrist surgery     Right wrist---Benign mass  . Foot surgery     Left--Neuroma, Bunion  . Liposuction     abdominal  . Tonsillectomy     as a child  . Bunionectomy     Right and Hammertoe    reports that she quit smoking about 32 years ago. She has never used smokeless tobacco. She reports that she drinks alcohol. She reports that she does not use illicit drugs. family history includes Breast cancer in her mother; Cancer in her cousin; Depression in her father; Diabetes in her other; Heart disease in her other; Hyperlipidemia in her father and mother; Kidney disease in her other; Prostate cancer in her father; and Stroke in her other. Allergies  Allergen Reactions  . Morphine      Review of Systems see hpi     Objective:   Physical Exam  Nursing note and vitals reviewed. Constitutional: She appears well-developed and well-nourished. No distress.  HENT:  Head: Normocephalic and  atraumatic.  Right Ear: External ear normal.  Left Ear: External ear normal.  Nose: Nose normal.  Eyes: EOM are normal. Right eye exhibits no discharge and no exudate. No foreign body present in the right eye. Left eye exhibits no discharge and no exudate. No foreign body present in the left eye. Right conjunctiva is injected. Right conjunctiva has no hemorrhage. Left conjunctiva is injected. Left conjunctiva has no hemorrhage. No scleral icterus.  Neurological: She is alert.  Skin: Skin is warm and dry. She is not diaphoretic.  Psychiatric: She has a normal mood and affect.          Assessment & Plan:

## 2011-07-16 NOTE — Assessment & Plan Note (Signed)
Vet literature search performed and reviewed with pt. 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 Recommend abx gtts with polytrim x 7d. Follow up closely if no improvement or worsening.

## 2011-07-26 IMAGING — US US ABDOMEN COMPLETE
1 series · 14 of 25 positions shown · non-contrast
Comparison: Report from study 04/07/2010.

CLINICAL DATA: Abnormal CT.  Question cirrhosis.

COMPLETE ABDOMINAL ULTRASOUND

[Series 1: us abdomen complete · 0.33mm/px · 14 of 73 slices shown]
[im 1/73]
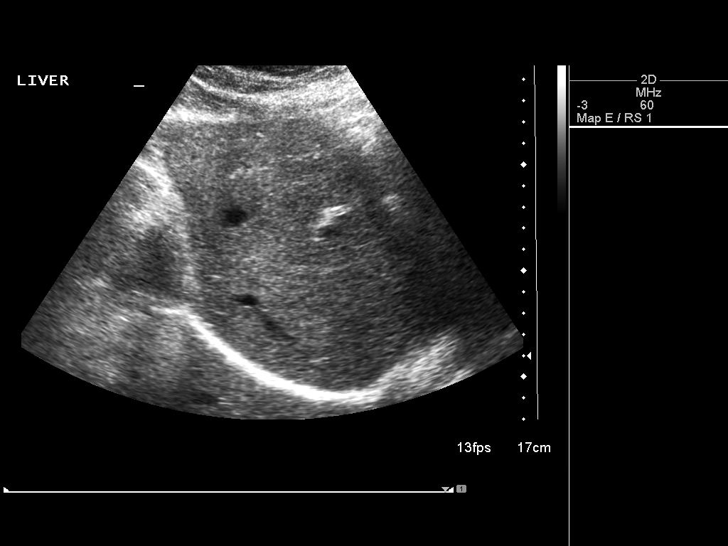
[im 7/73]
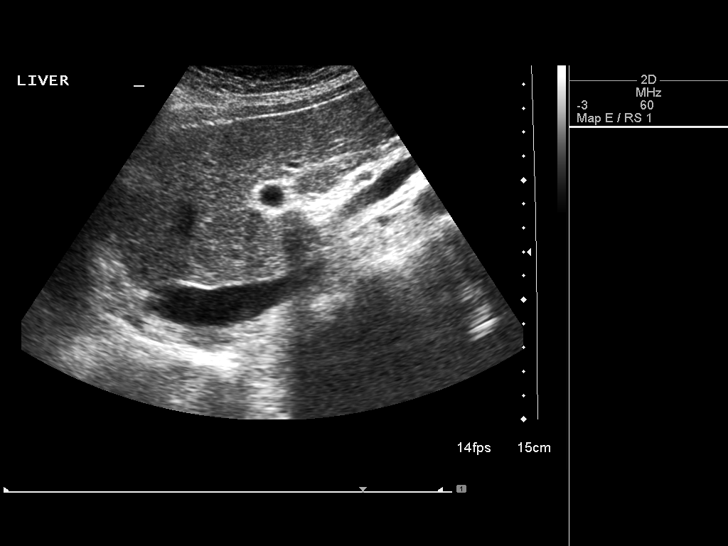
[im 13/73]
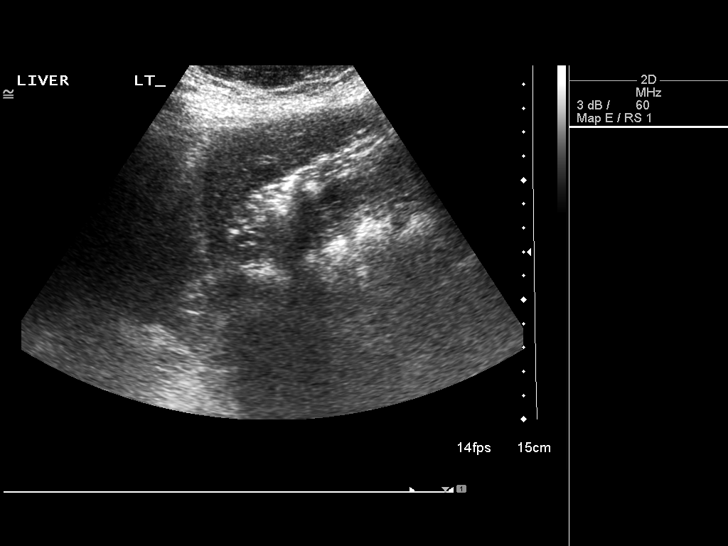
[im 19/73]
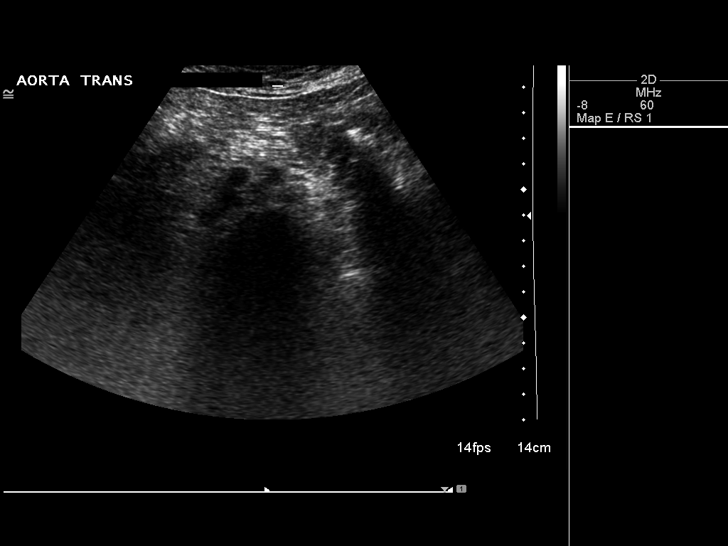
[im 25/73]
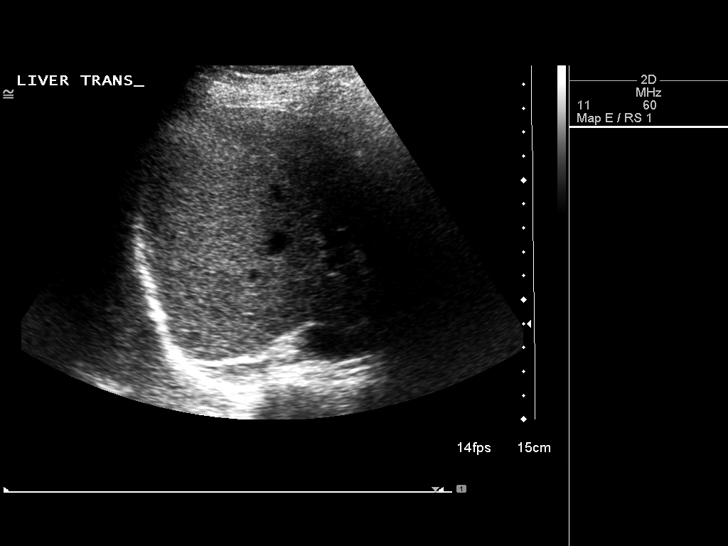
[im 28/73]
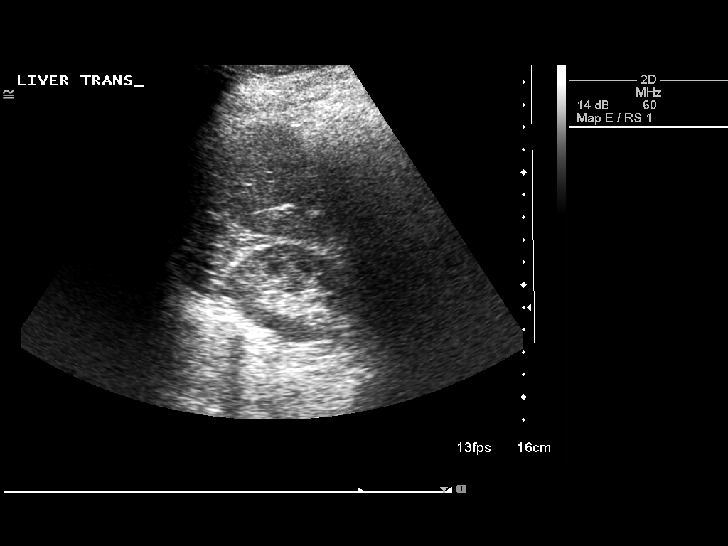
[im 34/73]
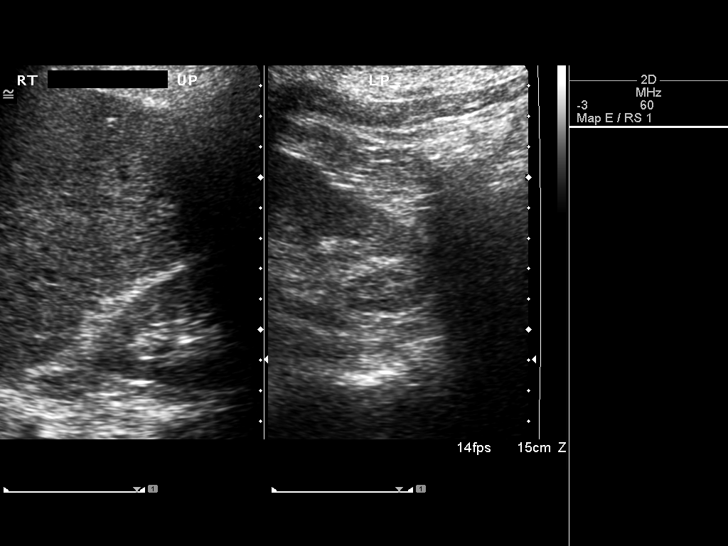
[im 40/73]
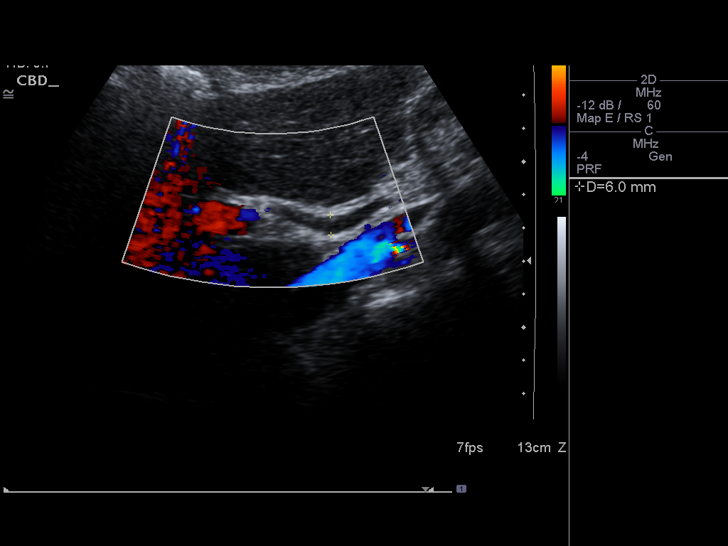
[im 46/73]
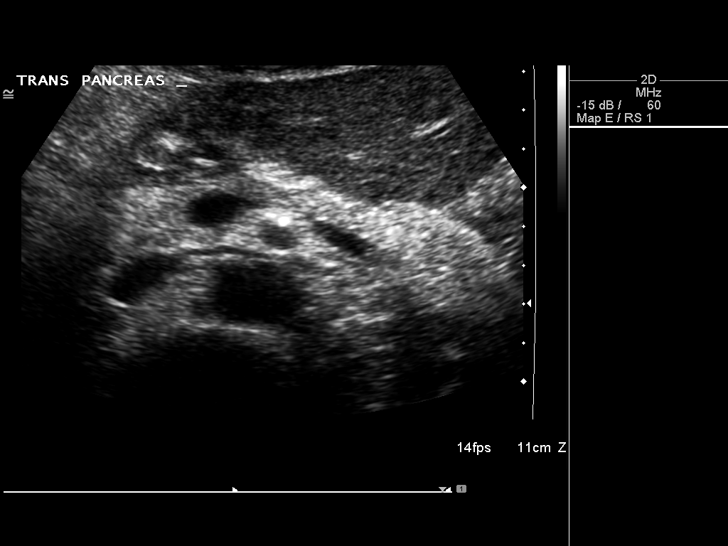
[im 49/73]
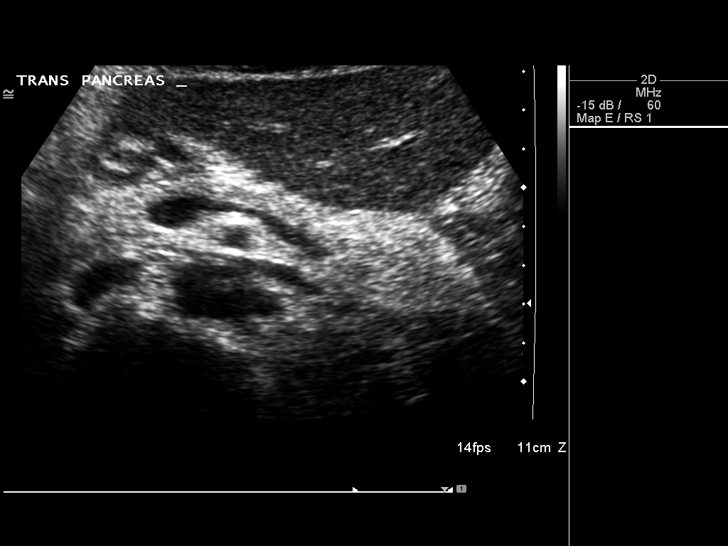
[im 55/73]
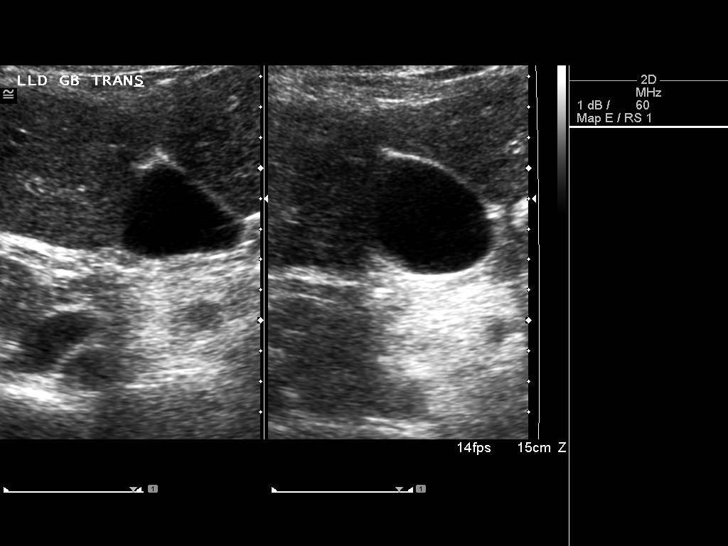
[im 61/73]
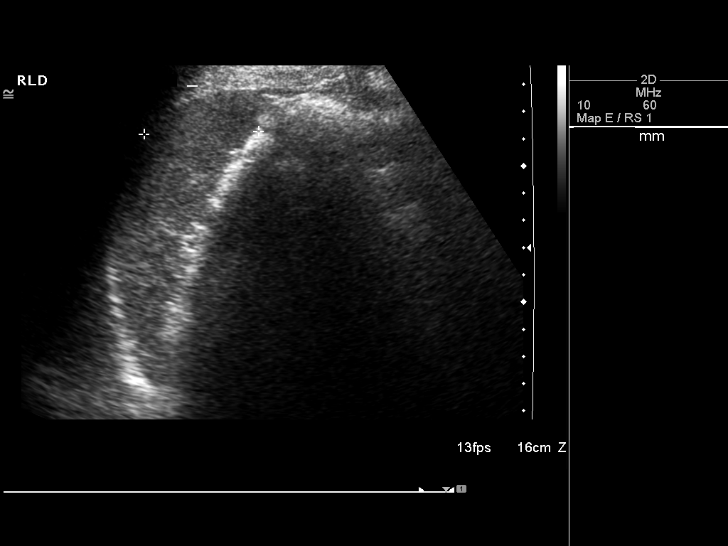
[im 67/73]
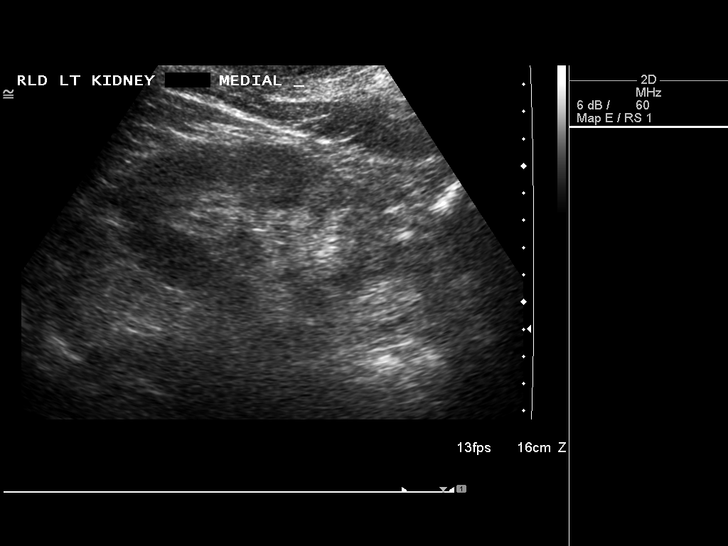
[im 73/73]
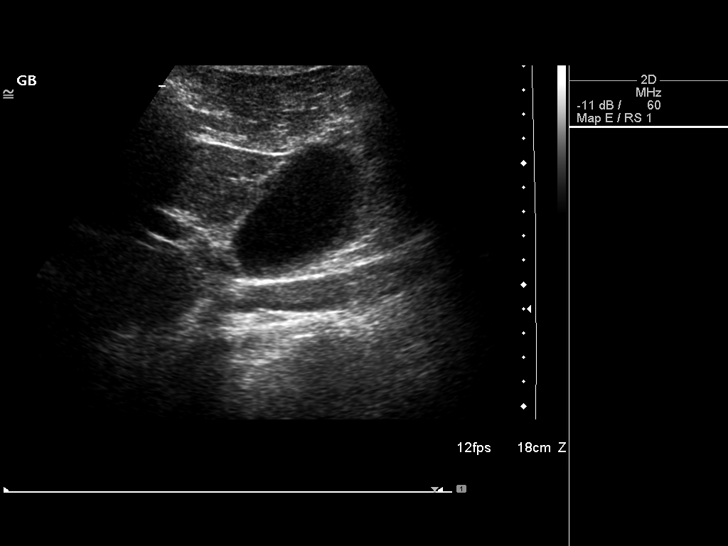

[14 of 25 positions shown; findings below may reference images not displayed]

FINDINGS: Gallbladder:  No gallstones, gallbladder wall thickening, or
pericholecystic fluid.

Common bile duct:   Normal caliber, up to 6 mm.

Liver:  No focal lesion identified.  Within normal limits in
parenchymal echogenicity.

IVC:  Appears normal.

Pancreas:  No focal abnormality seen.

Spleen:  Within normal limits in size and echotexture.

Right Kidney:   Normal in size and parenchymal echogenicity.  No
evidence of mass or hydronephrosis.

Left Kidney:  Normal in size and parenchymal echogenicity.  No
evidence of mass or hydronephrosis.

Abdominal aorta:  No aneurysm identified.
IMPRESSION: Unremarkable ultrasound.  I see no evidence sonographically of
cirrhosis.

## 2011-08-11 ENCOUNTER — Other Ambulatory Visit: Payer: Self-pay | Admitting: Family Medicine

## 2011-08-18 ENCOUNTER — Ambulatory Visit (INDEPENDENT_AMBULATORY_CARE_PROVIDER_SITE_OTHER)
Admission: RE | Admit: 2011-08-18 | Discharge: 2011-08-18 | Disposition: A | Discharge status: Home / Self Care | Payer: PRIVATE HEALTH INSURANCE | Source: Ambulatory Visit | Attending: Internal Medicine | Admitting: Internal Medicine

## 2011-08-18 ENCOUNTER — Ambulatory Visit (INDEPENDENT_AMBULATORY_CARE_PROVIDER_SITE_OTHER): Payer: PRIVATE HEALTH INSURANCE | Admitting: Internal Medicine

## 2011-08-18 VITALS — BP 120/72 | HR 71 | Temp 98.3°F | Wt 151.0 lb

## 2011-08-18 DIAGNOSIS — M546 Pain in thoracic spine: Secondary | ICD-10-CM | POA: Insufficient documentation

## 2011-08-18 NOTE — Progress Notes (Signed)
  Subjective:    Patient ID: Megan Singleton, female    DOB: 04/24/1951, 61 y.o.   MRN: [id number]  HPI Acute visit 9 month history of pain at the thoracic area, medial to the right shoulder blade. No radiation, worse with certain movements.  Past Medical History  Diagnosis Date  . GERD (gastroesophageal reflux disease)   . Allergy   . Thyroid disease   . Osteopenia   . Seizure disorder     she is on life long medication per Neuro and can not have driving privileges if she  stops meds  . Hyperlipidemia   . Fibromyalgia   . IBS (irritable bowel syndrome)   . Alcohol abuse   . RLS (restless legs syndrome)   . Depression   . Hashimoto's thyroiditis       Review of Systems No fever chills She has some degree of neck pain but nothing worse than usual. Denies any cough, weight loss, shortness of breath. Normal rash    Objective:   Physical Exam  Constitutional: She is oriented to person, place, and time. She appears well-developed and well-nourished. No distress.  Cardiovascular: Normal rate, regular rhythm and normal heart sounds.   No murmur heard. Pulmonary/Chest: Effort normal. No respiratory distress. She has no wheezes. She has no rales.  Musculoskeletal: She exhibits no edema.       Arms: Neurological: She is alert and oriented to person, place, and time.       Motor exam and DTRs symmetric  Skin: She is not diaphoretic.  Psychiatric: She has a normal mood and affect. Her behavior is normal. Judgment and thought content normal.        Assessment & Plan:

## 2011-08-18 NOTE — Patient Instructions (Signed)
Get the XR done Will refer you to a chiropractor See your primary doctor in 3 or 4   Weeks if no better

## 2011-08-18 NOTE — Assessment & Plan Note (Signed)
9 month history of thoracic back pain, given chronicity, lack of fever and weight loss and pain description is likely a musculoskeletal issue. Plan: X-ray, chiropractor referral.

## 2011-08-19 ENCOUNTER — Encounter: Payer: Self-pay | Admitting: Internal Medicine

## 2011-11-24 ENCOUNTER — Emergency Department (HOSPITAL_BASED_OUTPATIENT_CLINIC_OR_DEPARTMENT_OTHER)
Admission: EM | Admit: 2011-11-24 | Discharge: 2011-11-24 | Disposition: A | Discharge status: Home / Self Care | Payer: PRIVATE HEALTH INSURANCE | Attending: Emergency Medicine | Admitting: Emergency Medicine

## 2011-11-24 ENCOUNTER — Encounter (HOSPITAL_BASED_OUTPATIENT_CLINIC_OR_DEPARTMENT_OTHER): Payer: Self-pay | Admitting: Emergency Medicine

## 2011-11-24 DIAGNOSIS — S61219A Laceration without foreign body of unspecified finger without damage to nail, initial encounter: Secondary | ICD-10-CM

## 2011-11-24 DIAGNOSIS — E079 Disorder of thyroid, unspecified: Secondary | ICD-10-CM | POA: Insufficient documentation

## 2011-11-24 DIAGNOSIS — E785 Hyperlipidemia, unspecified: Secondary | ICD-10-CM | POA: Insufficient documentation

## 2011-11-24 DIAGNOSIS — K219 Gastro-esophageal reflux disease without esophagitis: Secondary | ICD-10-CM | POA: Insufficient documentation

## 2011-11-24 DIAGNOSIS — W260XXA Contact with knife, initial encounter: Secondary | ICD-10-CM | POA: Insufficient documentation

## 2011-11-24 DIAGNOSIS — Z79899 Other long term (current) drug therapy: Secondary | ICD-10-CM | POA: Insufficient documentation

## 2011-11-24 DIAGNOSIS — K589 Irritable bowel syndrome without diarrhea: Secondary | ICD-10-CM | POA: Insufficient documentation

## 2011-11-24 DIAGNOSIS — S61209A Unspecified open wound of unspecified finger without damage to nail, initial encounter: Secondary | ICD-10-CM | POA: Insufficient documentation

## 2011-11-24 HISTORY — DX: Unspecified convulsions: R56.9

## 2011-11-24 MED ORDER — LIDOCAINE HCL 2 % IJ SOLN
INTRAMUSCULAR | Status: AC
  Administered 2011-11-24: 20 mg
  Filled 2011-11-24: qty 1

## 2011-11-24 NOTE — ED Notes (Signed)
Laceration to left base of pinky finger with exacto knife, bleeding controlled,

## 2011-11-24 NOTE — ED Notes (Signed)
Cab called for pt.

## 2011-11-24 NOTE — Discharge Instructions (Signed)

## 2011-11-24 NOTE — ED Provider Notes (Signed)
History     CSN: [id number]  Arrival date & time 11/24/11  2038   First MD Initiated Contact with Patient 11/24/11 2124      Chief Complaint  Patient presents with  . Extremity Laceration    (Consider location/radiation/quality/duration/timing/severity/associated sxs/prior treatment) Patient is a 61 y.o. female presenting with skin laceration. The history is provided by the patient.  Laceration  The incident occurred less than 1 hour ago. Pain location: left 5th finger. Size: 2.5 cm. The laceration mechanism was a a clean knife. The pain is mild. The pain has been constant since onset. She reports no foreign bodies present. Her tetanus status is UTD.    Past Medical History  Diagnosis Date  . GERD (gastroesophageal reflux disease)   . Allergy   . Thyroid disease   . Osteopenia   . Seizure disorder     she is on life long medication per Neuro and can not have driving privileges if she  stops meds  . Hyperlipidemia   . Fibromyalgia   . IBS (irritable bowel syndrome)   . Alcohol abuse   . RLS (restless legs syndrome)   . Depression   . Hashimoto's thyroiditis   . Seizures     Past Surgical History  Procedure Date  . Cesarean section [id number]    x's 2  . Wrist surgery     Right wrist---Benign mass  . Foot surgery     Left--Neuroma, Bunion  . Liposuction     abdominal  . Tonsillectomy     as a child  . Bunionectomy     Right and Hammertoe    Family History  Problem Relation Age of Onset  . Breast cancer Mother   . Hyperlipidemia Mother   . Prostate cancer Father   . Hyperlipidemia Father   . Depression Father   . Cancer Cousin     Colon Cancer  . Diabetes Other     Maternal Grandparent  . Kidney disease Other     Maternal Grandparent  . Stroke Other     Grandparent  . Heart disease Other     Grand Parent    History  Substance Use Topics  . Smoking status: Former Smoker    Quit date: 07/21/1979  . Smokeless tobacco: Never Used  . Alcohol Use:  Yes     2 drinks per night    OB History    Grav Para Term Preterm Abortions TAB SAB Ect Mult Living                  Review of Systems  All other systems reviewed and are negative.    Allergies  Morphine  Home Medications   Current Outpatient Rx  Name Route Sig Dispense Refill  . ASPIRIN 81 MG PO TABS Oral Take 81 mg by mouth daily.      Marland Kitchen CALCIUM CARBONATE 600 MG PO TABS Oral Take 600 mg by mouth 2 (two) times daily with a meal.      . VITAMIN D 1000 UNITS PO TABS Oral Take 1,000 Units by mouth daily.      Marland Kitchen GABAPENTIN 600 MG PO TABS Oral Take 1,200 mg by mouth at bedtime.     Marland Kitchen LEVOTHYROXINE SODIUM 112 MCG PO TABS  TAKE ONE TABLET BY MOUTH EVERY DAY 30 tablet 7  . TAB-A-VITE/IRON PO TABS Oral Take 1 tablet by mouth daily. Only taking half of a tablet, not sure mg     . PANTOPRAZOLE SODIUM 40  MG PO TBEC Oral Take 1 tablet (40 mg total) by mouth daily. 30 tablet 2  . PNEUMOCOCCAL VAC POLYVALENT 25 MCG/0.5ML IJ INJ Intramuscular Inject 0.5 mLs into the muscle once. 0.5 mL 0  . POLYETHYLENE GLYCOL 3350 PO POWD Oral Take 21 g by mouth daily.      Marland Kitchen PRAMIPEXOLE DIHYDROCHLORIDE 1 MG PO TABS Oral Take 3 mg by mouth at bedtime.      Marland Kitchen SIMVASTATIN 20 MG PO TABS  TAKE 1 TABLET BY MOUTH EVERY NIGHT AT BEDTIME 30 tablet 3  . POLYMYXIN B-TRIMETHOPRIM 10000-0.1 UNIT/ML-% OP SOLN Both Eyes Place 1 drop into both eyes every 4 (four) hours. 10 mL 0    For 7 days  . VENLAFAXINE HCL ER 150 MG PO CP24 Oral Take 1 capsule (150 mg total) by mouth daily. 30 capsule 3  . VENLAFAXINE HCL 75 MG PO TABS Oral Take 75 mg by mouth daily.      Marland Kitchen ZONISAMIDE 100 MG PO CAPS Oral Take 100 mg by mouth as directed. 2 tabs q AM and 4 tabs qhs    . ZOSTER VACCINE LIVE 19400 UNT/0.65ML [place] SOLR Subcutaneous Inject 19,400 Units into the skin once. 1 vial 0    BP 129/75  Pulse 97  Temp(Src) 98.5 F (36.9 C) (Oral)  Resp 18  Ht 5\' 4"  (1.626 m)  Wt 150 lb (68.04 kg)  BMI 25.75 kg/m2  SpO2 97%  Physical Exam    Nursing note and vitals reviewed. Constitutional: She is oriented to person, place, and time. She appears well-developed and well-nourished.  HENT:  Head: Normocephalic and atraumatic.  Neck: Normal range of motion. Neck supple.  Musculoskeletal:       There is a 2.5 cm laceration to the volar aspect of the proximal left 5th finger.  There is no tendon involvement through range of motion in a bloodless field.  There is strong apposition with flexion.  Neurological: She is alert and oriented to person, place, and time.  Skin: Skin is warm and dry.    ED Course  Procedures (including critical care time)  Labs Reviewed - No data to display No results found.   No diagnosis found.  LACERATION REPAIR Performed by: Geoffery Lyons Authorized by: Geoffery Lyons Consent: Verbal consent obtained. Risks and benefits: risks, benefits and alternatives were discussed Consent given by: patient Patient identity confirmed: provided demographic data Prepped and Draped in normal sterile fashion Wound explored  Laceration Location: left 5th finger  Laceration Length: 2.5cm  No Foreign Bodies seen or palpated  Anesthesia: local infiltration  Local anesthetic: lidocaine 1% without epinephrine  Anesthetic total: 2 ml  Irrigation method: syringe Amount of cleaning: standard  Skin closure: 4-0 prolene  Number of sutures: 5  Technique: simple interrupted  Patient tolerance: Patient tolerated the procedure well with no immediate complications.  MDM  Wound care, sutures out in 7 days.          Geoffery Lyons, MD 11/24/11 2146

## 2011-11-24 NOTE — ED Notes (Signed)
Pt was trying to cut through a piece of rubber with exacto knife and sliced base of pinky finger on left hand

## 2011-12-04 ENCOUNTER — Other Ambulatory Visit: Payer: Self-pay | Admitting: Family Medicine

## 2011-12-07 ENCOUNTER — Encounter: Payer: Self-pay | Admitting: Family Medicine

## 2011-12-07 ENCOUNTER — Other Ambulatory Visit: Payer: Self-pay | Admitting: *Deleted

## 2011-12-07 NOTE — Telephone Encounter (Signed)
FYI: Called pt to clarify medication that she is referring too, pt advised that she just picked up her simvastatin today and wanted to let MD Tabori know about the pick up and the incident with her finger,

## 2012-01-26 ENCOUNTER — Other Ambulatory Visit: Payer: Self-pay | Admitting: Endocrinology

## 2012-02-18 ENCOUNTER — Ambulatory Visit (INDEPENDENT_AMBULATORY_CARE_PROVIDER_SITE_OTHER): Payer: PRIVATE HEALTH INSURANCE | Admitting: Family Medicine

## 2012-02-18 ENCOUNTER — Encounter: Payer: Self-pay | Admitting: Family Medicine

## 2012-02-18 VITALS — BP 124/72 | HR 84 | Temp 97.1°F | Wt 149.2 lb

## 2012-02-18 DIAGNOSIS — M109 Gout, unspecified: Secondary | ICD-10-CM

## 2012-02-18 MED ORDER — NAPROXEN 500 MG PO TABS: 500.0000 mg | ORAL_TABLET | Freq: Two times a day (BID) | ORAL | Status: DC

## 2012-02-18 NOTE — Patient Instructions (Addendum)
Gout Gout is an inflammatory condition (arthritis) caused by a buildup of uric acid crystals in the joints. Uric acid is a chemical that is normally present in the blood. Under some circumstances, uric acid can form into crystals in your joints. This causes joint redness, soreness, and swelling (inflammation). Repeat attacks are common. Over time, uric acid crystals can form into masses (tophi) near a joint, causing disfigurement. Gout is treatable and often preventable. CAUSES  The disease begins with elevated levels of uric acid in the blood. Uric acid is produced by your body when it breaks down a naturally found substance called purines. This also happens when you eat certain foods such as meats and fish. Causes of an elevated uric acid level include:  Being passed down from parent to child (heredity).   Diseases that cause increased uric acid production (obesity, psoriasis, some cancers).   Excessive alcohol use.   Diet, especially diets rich in meat and seafood.   Medicines, including certain cancer-fighting drugs (chemotherapy), diuretics, and aspirin.   Chronic kidney disease. The kidneys are no longer able to remove uric acid well.   Problems with metabolism.  Conditions strongly associated with gout include:  Obesity.   High blood pressure.   High cholesterol.   Diabetes.  Not everyone with elevated uric acid levels gets gout. It is not understood why some people get gout and others do not. Surgery, joint injury, and eating too much of certain foods are some of the factors that can lead to gout. SYMPTOMS   An attack of gout comes on quickly. It causes intense pain with redness, swelling, and warmth in a joint.   Fever can occur.   Often, only one joint is involved. Certain joints are more commonly involved:   Base of the big toe.   Knee.   Ankle.   Wrist.   Finger.  Without treatment, an attack usually goes away in a few days to weeks. Between attacks, you  usually will not have symptoms, which is different from many other forms of arthritis. DIAGNOSIS  Your caregiver will suspect gout based on your symptoms and exam. Removal of fluid from the joint (arthrocentesis) is done to check for uric acid crystals. Your caregiver will give you a medicine that numbs the area (local anesthetic) and use a needle to remove joint fluid for exam. Gout is confirmed when uric acid crystals are seen in joint fluid, using a special microscope. Sometimes, blood, urine, and X-ray tests are also used. TREATMENT  There are 2 phases to gout treatment: treating the sudden onset (acute) attack and preventing attacks (prophylaxis). Treatment of an Acute Attack  Medicines are used. These include anti-inflammatory medicines or steroid medicines.   An injection of steroid medicine into the affected joint is sometimes necessary.   The painful joint is rested. Movement can worsen the arthritis.   You may use warm or cold treatments on painful joints, depending which works best for you.   Discuss the use of coffee, vitamin C, or cherries with your caregiver. These may be helpful treatment options.  Treatment to Prevent Attacks After the acute attack subsides, your caregiver may advise prophylactic medicine. These medicines either help your kidneys eliminate uric acid from your body or decrease your uric acid production. You may need to stay on these medicines for a very long time. The early phase of treatment with prophylactic medicine can be associated with an increase in acute gout attacks. For this reason, during the first few months   of treatment, your caregiver may also advise you to take medicines usually used for acute gout treatment. Be sure you understand your caregiver's directions. You should also discuss dietary treatment with your caregiver. Certain foods such as meats and fish can increase uric acid levels. Other foods such as dairy can decrease levels. Your caregiver  can give you a list of foods to avoid. HOME CARE INSTRUCTIONS   Do not take aspirin to relieve pain. This raises uric acid levels.   Only take over-the-counter or prescription medicines for pain, discomfort, or fever as directed by your caregiver.   Rest the joint as much as possible. When in bed, keep sheets and blankets off painful areas.   Keep the affected joint raised (elevated).   Use crutches if the painful joint is in your leg.   Drink enough water and fluids to keep your urine clear or pale yellow. This helps your body get rid of uric acid. Do not drink alcoholic beverages. They slow the passage of uric acid.   Follow your caregiver's dietary instructions. Pay careful attention to the amount of protein you eat. Your daily diet should emphasize fruits, vegetables, whole grains, and fat-free or low-fat milk products.   Maintain a healthy body weight.  SEEK MEDICAL CARE IF:   You have an oral temperature above 102 F (38.9 C).   You develop diarrhea, vomiting, or any side effects from medicines.   You do not feel better in 24 hours, or you are getting worse.  SEEK IMMEDIATE MEDICAL CARE IF:   Your joint becomes suddenly more tender and you have:   Chills.   An oral temperature above 102 F (38.9 C), not controlled by medicine.  MAKE SURE YOU:   Understand these instructions.   Will watch your condition.   Will get help right away if you are not doing well or get worse.  Document Released: 07/03/2000 Document Revised: 06/25/2011 Document Reviewed: 10/14/2009 [employer] Patient Information 2012 [employer]. 

## 2012-02-18 NOTE — Progress Notes (Signed)
  Subjective:    Patient ID: Megan Singleton, female    DOB: [birth year], 61 y.o.   MRN: [id number]  HPI Pt here c/o pain in L big toe since last night.  Pt was told she may have gout.  Pt does drinks bourbon 2 glasses daily.      Review of Systems As above    Objective:   Physical Exam  Constitutional: She is oriented to person, place, and time. She appears well-developed and well-nourished.  Musculoskeletal:       L toe--slightly red and warm,  No swelling  Neurological: She is alert and oriented to person, place, and time.  Skin: Skin is warm and dry.  Psychiatric: She has a normal mood and affect. Her behavior is normal. Judgment normal.          Assessment & Plan:

## 2012-02-19 LAB — BASIC METABOLIC PANEL
Chloride: 109 mEq/L (ref 96–112)
Creatinine, Ser: 0.7 mg/dL (ref 0.4–1.2)
Sodium: 144 mEq/L (ref 135–145)

## 2012-02-19 LAB — URIC ACID: Uric Acid, Serum: 3.5 mg/dL (ref 2.4–7.0)

## 2012-02-23 ENCOUNTER — Telehealth: Payer: Self-pay | Admitting: Family Medicine

## 2012-02-23 MED ORDER — PANTOPRAZOLE SODIUM 40 MG PO TBEC: 40.0000 mg | DELAYED_RELEASE_TABLET | Freq: Every day | ORAL | Status: DC

## 2012-02-23 NOTE — Telephone Encounter (Signed)
Refill done.  

## 2012-02-23 NOTE — Telephone Encounter (Signed)
Refill: Pantoprazole 40mg  tablets. Take 1 tablet by mouth every day. Qty 30. Last fill 01-27-12

## 2012-03-01 ENCOUNTER — Other Ambulatory Visit: Payer: Self-pay | Admitting: Family Medicine

## 2012-03-02 NOTE — Telephone Encounter (Signed)
Ok for #60, 1 refill 

## 2012-03-02 NOTE — Telephone Encounter (Signed)
Last OV with [facility] 02-18-12, last refill also 02-18-12 #30 no refills however pt to take BID and only given #30 please advise

## 2012-03-03 NOTE — Telephone Encounter (Signed)
rx sent to pharmacy by e-script  

## 2012-04-11 ENCOUNTER — Telehealth: Payer: Self-pay | Admitting: Family Medicine

## 2012-04-11 MED ORDER — SIMVASTATIN 20 MG PO TABS: 20.0000 mg | ORAL_TABLET | Freq: Every day | ORAL | Status: DC

## 2012-04-11 NOTE — Telephone Encounter (Signed)
refill simvastatin 20mg  tablets #30 take one tablet by mouth every night -- last fill 9.3.13 Last ov 8.1.13 acute

## 2012-05-02 ENCOUNTER — Other Ambulatory Visit: Payer: Self-pay | Admitting: Family Medicine

## 2012-05-02 NOTE — Telephone Encounter (Signed)
REFILL NAPROXEN 500MG  TABLETS #60, take one tablet by mouth twice daily with a meal--last fill 9.13.13, last ov 8.1.13 acute

## 2012-05-03 MED ORDER — NAPROXEN 500 MG PO TABS: 500.0000 mg | ORAL_TABLET | Freq: Two times a day (BID) | ORAL | Status: DC

## 2012-05-03 NOTE — Telephone Encounter (Signed)
rx sent to pharmacy by e-script per last refill noted 03-01-12 #60 with 1 refill, MD Tabori gave verbal order for pt to have #60 with 1 refill,

## 2012-05-04 ENCOUNTER — Other Ambulatory Visit: Payer: Self-pay | Admitting: Family Medicine

## 2012-05-04 NOTE — Telephone Encounter (Signed)
refill Simvastatin 20MG  tablets #30 TK 1 T PO HS --last fill 10.13.13--last ov 8.1.13 acute

## 2012-05-05 NOTE — Telephone Encounter (Signed)
Advised pt overdue for CPE per noted in June 12, pt did not realize she did not come, pt accepted a follow up apt for this upcoming Monday 05-09-12 at 1:45pm, pt will schedule a CPE at this OV, pt understood and MD Tabori aware, pt notes she still has enough medication to last til this apt

## 2012-05-05 NOTE — Telephone Encounter (Signed)
Per previous documentation of 04/11/12 refill; pt needs cholesterol check prior to next refill. Left message on home# for pt to return my call.

## 2012-05-09 ENCOUNTER — Ambulatory Visit (INDEPENDENT_AMBULATORY_CARE_PROVIDER_SITE_OTHER): Payer: PRIVATE HEALTH INSURANCE | Admitting: Family Medicine

## 2012-05-09 ENCOUNTER — Encounter: Payer: Self-pay | Admitting: Family Medicine

## 2012-05-09 VITALS — BP 118/78 | HR 62 | Temp 97.7°F | Ht 64.5 in | Wt 149.8 lb

## 2012-05-09 DIAGNOSIS — J309 Allergic rhinitis, unspecified: Secondary | ICD-10-CM

## 2012-05-09 DIAGNOSIS — E785 Hyperlipidemia, unspecified: Secondary | ICD-10-CM

## 2012-05-09 DIAGNOSIS — E039 Hypothyroidism, unspecified: Secondary | ICD-10-CM

## 2012-05-09 LAB — LIPID PANEL
Cholesterol: 164 mg/dL (ref 0–200)
HDL: 69.5 mg/dL (ref >39.00)
VLDL: 15.2 mg/dL (ref 0.0–40.0)

## 2012-05-09 LAB — TSH: TSH: 0.37 u[IU]/mL (ref 0.35–5.50)

## 2012-05-09 LAB — BASIC METABOLIC PANEL
BUN: 20 mg/dL (ref 6–23)
Calcium: 9.2 mg/dL (ref 8.4–10.5)
GFR: 102.12 mL/min (ref >60.00)
Glucose, Bld: 83 mg/dL (ref 70–99)
Sodium: 140 mEq/L (ref 135–145)

## 2012-05-09 LAB — HEPATIC FUNCTION PANEL
AST: 20 U/L (ref 0–37)
Total Bilirubin: 0.5 mg/dL (ref 0.3–1.2)

## 2012-05-09 MED ORDER — FLUTICASONE PROPIONATE 50 MCG/ACT NA SUSP: 2.0000 | Freq: Every day | NASAL | Status: DC

## 2012-05-09 NOTE — Patient Instructions (Addendum)
Schedule your complete physical for June We'll notify you of your lab results and change meds as needed Start Claritin daily Add Flonase- 2 sprays each nostril daily Drink plenty of fluids Happy early birthday!!!

## 2012-05-09 NOTE — Progress Notes (Signed)
  Subjective:    Patient ID: Megan Singleton, female    DOB: [birth year], 61 y.o.   MRN: [id number]  HPI Hyperlipidemia- chronic problem, on Simvastatin daily.  Denies abd pain, N/V, myalgias.  Hypothyroid- chronic problem, on levothyroxine.  Denies dry skin/hair/nails, cold intolerance, fatigue.  Nasal congestion- hx of fall allergies, using AYR w/ some relief.  Unable to sleep at night due to congestion.  Used sudafed w/ relief.   Review of Systems For ROS see HPI     Objective:   Physical Exam  Vitals reviewed. Constitutional: She is oriented to person, place, and time. She appears well-developed and well-nourished. No distress.  HENT:  Head: Normocephalic and atraumatic.  Right Ear: Tympanic membrane normal.  Left Ear: Tympanic membrane normal.  Nose: Mucosal edema and rhinorrhea present. Right sinus exhibits no maxillary sinus tenderness and no frontal sinus tenderness. Left sinus exhibits no maxillary sinus tenderness and no frontal sinus tenderness.  Mouth/Throat: Mucous membranes are normal. Posterior oropharyngeal erythema (w/ PND) present.  Eyes: Conjunctivae normal and EOM are normal. Pupils are equal, round, and reactive to light.  Neck: Normal range of motion. Neck supple.  Cardiovascular: Normal rate, regular rhythm and normal heart sounds.   Pulmonary/Chest: Effort normal and breath sounds normal. No respiratory distress. She has no wheezes. She has no rales.  Musculoskeletal: She exhibits no edema.  Lymphadenopathy:    She has no cervical adenopathy.  Neurological: She is alert and oriented to person, place, and time.  Skin: Skin is warm and dry.          Assessment & Plan:

## 2012-05-10 NOTE — Assessment & Plan Note (Signed)
Deteriorated b/c pt is not currently treating seasonal allergies.  Start OTC antihistamine and add nasal steroid.  Reviewed supportive care and red flags that should prompt return.  Pt expressed understanding and is in agreement w/ plan.

## 2012-05-10 NOTE — Assessment & Plan Note (Signed)
Chronic problem.  Tolerating statin w/out difficulty.  Check labs.  Adjust meds prn  

## 2012-05-10 NOTE — Assessment & Plan Note (Signed)
Chronic problem.  Asymptomatic.  Check labs.  Adjust meds prn  

## 2012-05-18 ENCOUNTER — Other Ambulatory Visit: Payer: Self-pay | Admitting: Endocrinology

## 2012-05-18 ENCOUNTER — Other Ambulatory Visit: Payer: Self-pay | Admitting: Family Medicine

## 2012-05-18 MED ORDER — PANTOPRAZOLE SODIUM 40 MG PO TBEC: 40.0000 mg | DELAYED_RELEASE_TABLET | Freq: Every day | ORAL | Status: DC

## 2012-05-18 NOTE — Telephone Encounter (Signed)
rx sent to pharmacy by e-script  

## 2012-05-18 NOTE — Telephone Encounter (Signed)
refill Pantoprazole  40 MG Take 1 tablet (40 mg total) by mouth daily #30 last fill 10.1.13--last ov 10.21.13 follow up

## 2012-05-30 ENCOUNTER — Telehealth: Payer: Self-pay | Admitting: Family Medicine

## 2012-05-30 MED ORDER — SIMVASTATIN 20 MG PO TABS: 20.0000 mg | ORAL_TABLET | Freq: Every day | ORAL | Status: DC

## 2012-05-30 NOTE — Telephone Encounter (Signed)
Refill: Simvastatin 20 mg tablet. Take 1 tablet by mouth at bedtime. Will need fasting labs prior to more refills. Qty 30. Last fill 05-01-12

## 2012-05-30 NOTE — Telephone Encounter (Signed)
Rx sent 

## 2012-06-15 ENCOUNTER — Other Ambulatory Visit: Payer: Self-pay | Admitting: *Deleted

## 2012-06-15 MED ORDER — NAPROXEN 500 MG PO TABS: 500.0000 mg | ORAL_TABLET | Freq: Two times a day (BID) | ORAL | Status: DC

## 2012-06-15 NOTE — Telephone Encounter (Signed)
Last OV 05-09-12, last filled 05-03-12 #60 1

## 2012-06-15 NOTE — Telephone Encounter (Signed)
Refill x1 

## 2012-06-15 NOTE — Telephone Encounter (Signed)
Rx sent 

## 2012-08-25 ENCOUNTER — Other Ambulatory Visit: Payer: Self-pay

## 2012-08-25 MED ORDER — LEVOTHYROXINE SODIUM 112 MCG PO TABS: 112.0000 ug | ORAL_TABLET | Freq: Every day | ORAL | Status: DC

## 2012-08-25 NOTE — Telephone Encounter (Signed)
i am no longer caring for this pt Please ask pharmacy to refill x 1 only, then direct further refill requests to dr Beverely Low

## 2012-09-03 ENCOUNTER — Other Ambulatory Visit: Payer: Self-pay

## 2012-09-13 ENCOUNTER — Other Ambulatory Visit: Payer: Self-pay | Admitting: Endocrinology

## 2012-11-02 ENCOUNTER — Other Ambulatory Visit: Payer: Self-pay | Admitting: Family Medicine

## 2012-11-02 DIAGNOSIS — E785 Hyperlipidemia, unspecified: Secondary | ICD-10-CM

## 2012-11-02 NOTE — Telephone Encounter (Signed)
SIMVASTATIN 20 MG TABLETS QTY:30 LAST REFILL 4.7.14 TK 1 T PO HS

## 2012-11-03 NOTE — Telephone Encounter (Signed)
Also refill: pantoprazole 40 mg tablets. Take 1 tablet by mouth daily. Qty 30. Last fill 08-28-12

## 2012-11-07 ENCOUNTER — Encounter: Payer: Self-pay | Admitting: General Practice

## 2012-11-07 MED ORDER — PANTOPRAZOLE SODIUM 40 MG PO TBEC: 40.0000 mg | DELAYED_RELEASE_TABLET | Freq: Every day | ORAL | Status: DC

## 2012-11-07 NOTE — Telephone Encounter (Signed)
Orders placed.

## 2012-11-07 NOTE — Telephone Encounter (Signed)
Waiting for pt to call to schedule fasting labs before the simvastatin can be filled.

## 2012-11-07 NOTE — Telephone Encounter (Signed)
Patient returned phone call. I informed her that she needs fasting labs prior to med refill. She is going to be coming in tomorrow morning for labs. Please put in lab orders. Thanks!

## 2012-11-08 ENCOUNTER — Other Ambulatory Visit (INDEPENDENT_AMBULATORY_CARE_PROVIDER_SITE_OTHER): Payer: PRIVATE HEALTH INSURANCE

## 2012-11-08 DIAGNOSIS — E785 Hyperlipidemia, unspecified: Secondary | ICD-10-CM

## 2012-11-08 LAB — LIPID PANEL
Cholesterol: 150 mg/dL (ref 0–200)
HDL: 75.6 mg/dL (ref >39.00)
Triglycerides: 47 mg/dL (ref 0.0–149.0)
VLDL: 9.4 mg/dL (ref 0.0–40.0)

## 2012-11-09 ENCOUNTER — Encounter: Payer: Self-pay | Admitting: *Deleted

## 2012-11-20 ENCOUNTER — Emergency Department (HOSPITAL_COMMUNITY)
Admission: EM | Admit: 2012-11-20 | Discharge: 2012-11-20 | Disposition: A | Discharge status: Home / Self Care | Payer: PRIVATE HEALTH INSURANCE | Attending: Emergency Medicine | Admitting: Emergency Medicine

## 2012-11-20 ENCOUNTER — Encounter (HOSPITAL_COMMUNITY): Payer: Self-pay | Admitting: Emergency Medicine

## 2012-11-20 DIAGNOSIS — G40909 Epilepsy, unspecified, not intractable, without status epilepticus: Secondary | ICD-10-CM | POA: Insufficient documentation

## 2012-11-20 DIAGNOSIS — Z8739 Personal history of other diseases of the musculoskeletal system and connective tissue: Secondary | ICD-10-CM | POA: Insufficient documentation

## 2012-11-20 DIAGNOSIS — F329 Major depressive disorder, single episode, unspecified: Secondary | ICD-10-CM | POA: Insufficient documentation

## 2012-11-20 DIAGNOSIS — F411 Generalized anxiety disorder: Secondary | ICD-10-CM | POA: Insufficient documentation

## 2012-11-20 DIAGNOSIS — IMO0002 Reserved for concepts with insufficient information to code with codable children: Secondary | ICD-10-CM | POA: Insufficient documentation

## 2012-11-20 DIAGNOSIS — Z87891 Personal history of nicotine dependence: Secondary | ICD-10-CM | POA: Insufficient documentation

## 2012-11-20 DIAGNOSIS — E785 Hyperlipidemia, unspecified: Secondary | ICD-10-CM | POA: Insufficient documentation

## 2012-11-20 DIAGNOSIS — Z79899 Other long term (current) drug therapy: Secondary | ICD-10-CM | POA: Insufficient documentation

## 2012-11-20 DIAGNOSIS — F3289 Other specified depressive episodes: Secondary | ICD-10-CM | POA: Insufficient documentation

## 2012-11-20 DIAGNOSIS — F419 Anxiety disorder, unspecified: Secondary | ICD-10-CM

## 2012-11-20 DIAGNOSIS — R1110 Vomiting, unspecified: Secondary | ICD-10-CM | POA: Insufficient documentation

## 2012-11-20 DIAGNOSIS — K219 Gastro-esophageal reflux disease without esophagitis: Secondary | ICD-10-CM | POA: Insufficient documentation

## 2012-11-20 MED ORDER — LORAZEPAM 1 MG PO TABS: 1.0000 mg | ORAL_TABLET | Freq: Three times a day (TID) | ORAL | Status: DC | PRN

## 2012-11-20 MED ORDER — ONDANSETRON 8 MG PO TBDP
8.0000 mg | ORAL_TABLET | Freq: Once | ORAL | Status: AC
  Administered 2012-11-20: 8 mg via ORAL
  Filled 2012-11-20: qty 1

## 2012-11-20 MED ORDER — ONDANSETRON HCL 8 MG PO TABS: 8.0000 mg | ORAL_TABLET | Freq: Three times a day (TID) | ORAL | Status: DC | PRN

## 2012-11-20 NOTE — ED Notes (Signed)
Patient  Very anxious and worried. Stated that she and her husband had an argument last night and states that she does not feel comfortable in her own skin. Jump from topic to topic, rambling about she and her husband don't sleep with one another because he snores but she slept with him last night to get over the fight. There is suicide attempts in the family. 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 She has been diagnosed with epilepsy. Patient going on and on about various topics.

## 2012-11-20 NOTE — ED Notes (Addendum)
Pt states she is having a panic attack.  Pt states she been having emesis since the middle of the night.  Pt states she is anxious, denies chest pain, SI/HI.  Pt states she got into argument with husband last night which started symptoms.

## 2012-11-20 NOTE — ED Notes (Signed)
Seizure precautions intact.

## 2012-11-20 NOTE — ED Provider Notes (Signed)
History     CSN: [id number]  Arrival date & time 11/20/12  0803   First MD Initiated Contact with Patient 11/20/12 [phone]      Chief Complaint  Patient presents with  . Anxiety  . Emesis    (Consider location/radiation/quality/duration/timing/severity/associated sxs/prior treatment) HPI Comments: Megan Singleton is a 62 y.o. female who presents for evaluation nausea and nervousness. Her symptoms have been aggravated over the last 2 days. She's had similar problems in the past when she feels stressed. She does not take treatment for anxiety. She sees a psychiatrist for depression. She has chronic epilepsy, but recently had a 24-hour EEG that was negative. She remains on antiepileptic medication. She denies fever, chills, chest pain, shortness of breath, cough, weakness, or dizziness. She had an argument with her husband yesterday, which aggravated the current problem. She typically has similar symptoms and about once every week. There are no other modifying factors.  Patient is a 62 y.o. female presenting with anxiety and vomiting. The history is provided by the patient.  Anxiety  Emesis   Past Medical History  Diagnosis Date  . GERD (gastroesophageal reflux disease)   . Allergy   . Thyroid disease   . Osteopenia   . Seizure disorder     she is on life long medication per Neuro and can not have driving privileges if she  stops meds  . Hyperlipidemia   . Fibromyalgia   . IBS (irritable bowel syndrome)   . Alcohol abuse   . RLS (restless legs syndrome)   . Depression   . Hashimoto's thyroiditis   . Seizures     Past Surgical History  Procedure Laterality Date  . Cesarean section  5621,3086    x's 2  . Wrist surgery      Right wrist---Benign mass  . Foot surgery      Left--Neuroma, Bunion  . Liposuction      abdominal  . Tonsillectomy      as a child  . Bunionectomy      Right and Hammertoe    Family History  Problem Relation Age of Onset  . Breast cancer  Mother   . Hyperlipidemia Mother   . Prostate cancer Father   . Hyperlipidemia Father   . Depression Father   . Cancer Cousin     Colon Cancer  . Diabetes Other     Maternal Grandparent  . Kidney disease Other     Maternal Grandparent  . Stroke Other     Grandparent  . Heart disease Other     Grand Parent    History  Substance Use Topics  . Smoking status: Former Smoker    Quit date: 07/21/1979  . Smokeless tobacco: Never Used  . Alcohol Use: Yes     Comment: 2 drinks per night    OB History   Grav Para Term Preterm Abortions TAB SAB Ect Mult Living                  Review of Systems  Gastrointestinal: Positive for vomiting.  All other systems reviewed and are negative.    Allergies  Morphine  Home Medications   Current Outpatient Rx  Name  Route  Sig  Dispense  Refill  . aspirin 81 MG tablet   Oral   Take 81 mg by mouth daily.           . calcium carbonate (OS-CAL) 600 MG TABS   Oral   Take 600 mg by  mouth 2 (two) times daily with a meal.           . cholecalciferol (VITAMIN D) 1000 UNITS tablet   Oral   Take 1,000 Units by mouth daily.           . fluticasone (FLONASE) 50 MCG/ACT nasal spray   Nasal   Place 2 sprays into the nose daily.   16 g   6   . gabapentin (NEURONTIN) 600 MG tablet   Oral   Take 1,200 mg by mouth at bedtime.          Marland Kitchen levothyroxine (SYNTHROID, LEVOTHROID) 112 MCG tablet   Oral   Take 1 tablet (112 mcg total) by mouth daily.   30 tablet   3   . LORazepam (ATIVAN) 1 MG tablet   Oral   Take 1 tablet (1 mg total) by mouth 3 (three) times daily as needed for anxiety.   6 tablet   0   . Multiple Vitamins-Iron (MULTIVITAMINS WITH IRON) TABS   Oral   Take 1 tablet by mouth daily. Only taking half of a tablet, not sure mg          . naproxen (NAPROSYN) 500 MG tablet   Oral   Take 1 tablet (500 mg total) by mouth 2 (two) times daily with a meal.   60 tablet   1   . ondansetron (ZOFRAN) 8 MG tablet    Oral   Take 1 tablet (8 mg total) by mouth every 8 (eight) hours as needed for nausea.   20 tablet   0   . pantoprazole (PROTONIX) 40 MG tablet   Oral   Take 1 tablet (40 mg total) by mouth daily.   30 tablet   3   . pneumococcal 23 valent vaccine (PNEUMOVAX 23) 25 MCG/0.5ML injection   Intramuscular   Inject 0.5 mLs into the muscle once.   0.5 mL   0   . polyethylene glycol powder (GLYCOLAX/MIRALAX) powder   Oral   Take 21 g by mouth daily.           . pramipexole (MIRAPEX) 1 MG tablet   Oral   Take 3 mg by mouth at bedtime.           . simvastatin (ZOCOR) 20 MG tablet   Oral   Take 1 tablet (20 mg total) by mouth at bedtime. Will need fasting labs prior to more refills.   30 tablet   5   . venlafaxine (EFFEXOR XR) 150 MG 24 hr capsule   Oral   Take 1 capsule (150 mg total) by mouth daily.   30 capsule   3   . venlafaxine (EFFEXOR) 75 MG tablet   Oral   Take 75 mg by mouth daily.           Marland Kitchen zonisamide (ZONEGRAN) 100 MG capsule   Oral   Take 100 mg by mouth as directed. 2 tabs q AM and 4 tabs qhs         . zoster vaccine live, PF, (ZOSTAVAX) 40981 UNT/0.65ML injection   Subcutaneous   Inject 19,400 Units into the skin once.   1 vial   0     BP 145/69  Pulse 66  Temp(Src) 98.2 F (36.8 C) (Oral)  Resp 19  SpO2 100%  Physical Exam  Nursing note and vitals reviewed. Constitutional: She is oriented to person, place, and time. She appears well-developed and well-nourished.  HENT:  Head: Normocephalic and  atraumatic.  Eyes: Conjunctivae and EOM are normal. Pupils are equal, round, and reactive to light.  Neck: Normal range of motion and phonation normal. Neck supple.  Cardiovascular: Normal rate, regular rhythm and intact distal pulses.   Pulmonary/Chest: Effort normal and breath sounds normal. She exhibits no tenderness.  Abdominal: Soft. She exhibits no distension and no mass. There is no tenderness. There is no guarding.  Musculoskeletal:  Normal range of motion.  Neurological: She is alert and oriented to person, place, and time. She has normal strength. She exhibits normal muscle tone.  Skin: Skin is warm and dry.  Psychiatric: Her behavior is normal. Judgment and thought content normal.  Mildly anxious    ED Course  Procedures (including critical care time)  Medications  ondansetron (ZOFRAN-ODT) disintegrating tablet 8 mg (8 mg Oral Given 11/20/12 0933)   Patient states symptoms are abated after Zofran in the ED. She would like some additional medication to use at home as needed      1. Anxiety       MDM  Stress-related anxiety, secondary to nausea and vomiting. No active psychosis or unstable psychiatric issues. Doubt metabolic instability, serious bacterial infection or impending vascular collapse; the patient is stable for discharge.   Nursing Notes Reviewed/ Care Coordinated, and agree without changes. Applicable Imaging Reviewed.  Interpretation of Laboratory Data incorporated into ED treatment  Plan: Home Medications- Ativan. #6, Zofran, #20; Home Treatments- rest, gradually advance diet; Recommended follow up- psychiatry followup as soon as possible to discuss further treatment of anxiety    Flint Melter, MD 11/20/12 1053

## 2012-12-07 ENCOUNTER — Other Ambulatory Visit: Payer: Self-pay | Admitting: Family Medicine

## 2012-12-08 NOTE — Telephone Encounter (Signed)
Rx sent to the pharmacy by e-script.//AB/CMA 

## 2013-01-11 ENCOUNTER — Other Ambulatory Visit: Payer: Self-pay

## 2013-01-31 ENCOUNTER — Other Ambulatory Visit: Payer: Self-pay | Admitting: Family Medicine

## 2013-02-01 ENCOUNTER — Other Ambulatory Visit: Payer: Self-pay | Admitting: Endocrinology

## 2013-02-01 ENCOUNTER — Telehealth: Payer: Self-pay | Admitting: Family Medicine

## 2013-02-01 NOTE — Telephone Encounter (Signed)
Patient is calling to see if Dr. Beverely Low will prescribe her Premarin cream for the vaginal irritation she is having now after menopause.

## 2013-02-01 NOTE — Telephone Encounter (Signed)
LVM advising patient that she would need an office visit to discuss with Dr. Beverely Low. Advised that it is also time to do a follow up lipid panel/liver function. GF/RN

## 2013-02-06 ENCOUNTER — Encounter: Payer: Self-pay | Admitting: Family Medicine

## 2013-02-06 ENCOUNTER — Ambulatory Visit (INDEPENDENT_AMBULATORY_CARE_PROVIDER_SITE_OTHER): Payer: PRIVATE HEALTH INSURANCE | Admitting: Family Medicine

## 2013-02-06 VITALS — BP 110/70 | HR 75 | Temp 99.0°F | Ht 64.0 in | Wt 147.4 lb

## 2013-02-06 DIAGNOSIS — IMO0002 Reserved for concepts with insufficient information to code with codable children: Secondary | ICD-10-CM

## 2013-02-06 DIAGNOSIS — E785 Hyperlipidemia, unspecified: Secondary | ICD-10-CM

## 2013-02-06 DIAGNOSIS — E039 Hypothyroidism, unspecified: Secondary | ICD-10-CM

## 2013-02-06 LAB — BASIC METABOLIC PANEL
BUN: 18 mg/dL (ref 6–23)
CO2: 25 mEq/L (ref 19–32)
Chloride: 108 mEq/L (ref 96–112)
Creatinine, Ser: 0.6 mg/dL (ref 0.4–1.2)

## 2013-02-06 LAB — HEPATIC FUNCTION PANEL
ALT: 21 U/L (ref 0–35)
Albumin: 4.2 g/dL (ref 3.5–5.2)
Bilirubin, Direct: 0 mg/dL (ref 0.0–0.3)
Total Protein: 7.2 g/dL (ref 6.0–8.3)

## 2013-02-06 LAB — LIPID PANEL
Total CHOL/HDL Ratio: 2
Triglycerides: 101 mg/dL (ref 0.0–149.0)

## 2013-02-06 MED ORDER — OSPEMIFENE 60 MG PO TABS: 1.0000 | ORAL_TABLET | Freq: Every day | ORAL | Status: DC

## 2013-02-06 NOTE — Assessment & Plan Note (Signed)
Chronic problem.  Doing well on synthroid.  Check labs.  Adjust meds prn.

## 2013-02-06 NOTE — Assessment & Plan Note (Signed)
Chronic problem.  Tolerating statin w/out difficulty.  Check labs.  Adjust meds prn  

## 2013-02-06 NOTE — Patient Instructions (Addendum)
Schedule your complete physical for October Call and schedule your Mammo!!! We'll notify you of your lab results and make any changes if needed Start the Osphena daily- take w/ food Call with any questions or concerns Have a great summer!

## 2013-02-06 NOTE — Assessment & Plan Note (Signed)
Recurrent issue.  Pt was previously on Premarin cream but did not like it.  + family hx breast cancer- mom.  Wants to restart something for painful intercourse- interested in osphena.  Stressed the need for mammo- pt to schedule.  If no mammo at time of med refill- no additional meds.  Will follow.

## 2013-02-06 NOTE — Progress Notes (Signed)
  Subjective:    Patient ID: Megan Singleton, female    DOB: 07-14-1951, 62 y.o.   MRN: [id number]  HPI Hyperlipidemia- chronic problem, on Simvastatin daily.  Now exercising regularly.  Denies abd pain, N/V, myalgias.    Hypothyroid- chronic problem, on Synthroid 112 mcg.  Due for labs.  Denies constipation, dry skin/hair/nails.  Now exercising regularly.  Vaginal dryness/dyspareunia- previously on Premarin cream but did not like it.  Husband is younger than her and she reports they have been unable to have sex.  Wants to make a change.  Due for mammo and pap due this year.  Pt is interested in starting Osphena.   Review of Systems For ROS see HPI     Objective:   Physical Exam  Vitals reviewed. Constitutional: She is oriented to person, place, and time. She appears well-developed and well-nourished. No distress.  HENT:  Head: Normocephalic and atraumatic.  Eyes: Conjunctivae and EOM are normal. Pupils are equal, round, and reactive to light.  Neck: Normal range of motion. Neck supple. No thyromegaly present.  Cardiovascular: Normal rate, regular rhythm, normal heart sounds and intact distal pulses.   No murmur heard. Pulmonary/Chest: Effort normal and breath sounds normal. No respiratory distress.  Abdominal: Soft. She exhibits no distension. There is no tenderness.  Musculoskeletal: She exhibits no edema.  Lymphadenopathy:    She has no cervical adenopathy.  Neurological: She is alert and oriented to person, place, and time.  Skin: Skin is warm and dry.  Psychiatric: She has a normal mood and affect. Her behavior is normal.          Assessment & Plan:

## 2013-02-07 ENCOUNTER — Telehealth: Payer: Self-pay | Admitting: *Deleted

## 2013-02-07 MED ORDER — LEVOTHYROXINE SODIUM 100 MCG PO TABS: 100.0000 ug | ORAL_TABLET | Freq: Every day | ORAL | Status: DC

## 2013-02-07 NOTE — Telephone Encounter (Signed)
Spoke with the pt and informed her of recent lab results and note.  Pt understood and agreed.  New rx sent to the pharmacy by e-script.//AB/CMA 

## 2013-02-07 NOTE — Telephone Encounter (Signed)
Message copied by Verdie Shire on Tue Feb 07, 2013  3:46 PM ------      Message from: Sheliah Hatch      Created: Mon Feb 06, 2013  4:53 PM       Labs look great w/ exception of low TSH- based on this, needs to decrease Synthroid dose to daily.  Please send new script. ------

## 2013-03-01 ENCOUNTER — Other Ambulatory Visit: Payer: Self-pay | Admitting: Family Medicine

## 2013-03-01 NOTE — Telephone Encounter (Signed)
Rx sent to the pharmacy by e-script.//AB/CMA 

## 2013-04-11 ENCOUNTER — Encounter: Payer: Self-pay | Admitting: *Deleted

## 2013-04-14 ENCOUNTER — Telehealth: Payer: Self-pay | Admitting: General Practice

## 2013-04-14 MED ORDER — LORAZEPAM 1 MG PO TABS: 1.0000 mg | ORAL_TABLET | Freq: Two times a day (BID) | ORAL | Status: DC | PRN

## 2013-04-14 NOTE — Telephone Encounter (Signed)
Can refill Lorazepam on med list- #20, no refills

## 2013-04-14 NOTE — Telephone Encounter (Signed)
Pt called triage line wanting to know if there is something low dose for anxiety that could be called in today, due to mother coming to visit.

## 2013-04-14 NOTE — Telephone Encounter (Signed)
Med filled and faxed in. t

## 2013-04-19 ENCOUNTER — Encounter: Payer: PRIVATE HEALTH INSURANCE | Admitting: Family Medicine

## 2013-04-25 ENCOUNTER — Encounter: Payer: PRIVATE HEALTH INSURANCE | Admitting: Family Medicine

## 2013-05-02 ENCOUNTER — Telehealth: Payer: Self-pay

## 2013-05-02 ENCOUNTER — Encounter: Payer: Self-pay | Admitting: Lab

## 2013-05-02 NOTE — Telephone Encounter (Signed)
Medication and allergies: done  n/a for 90 day supply (mail order) [facility] [address] for local pharmacy   Immunizations due: UTD    Received flu and shingles at pharmacy (HM updated)  A/P:  Last:  PAP:  Due 01/2010 Patient states that it was since then (unable to locate in chart if more recent)          MMG: Scheduled for next week  Dexa: na  CCS: UTD 02/2011 DM: n/a HTN: n/a Lipids: due  To Discuss with Provider: Patient will finish up 24h EEG today

## 2013-05-03 ENCOUNTER — Ambulatory Visit (INDEPENDENT_AMBULATORY_CARE_PROVIDER_SITE_OTHER): Payer: PRIVATE HEALTH INSURANCE | Admitting: Family Medicine

## 2013-05-03 ENCOUNTER — Encounter: Payer: Self-pay | Admitting: Family Medicine

## 2013-05-03 VITALS — BP 120/70 | HR 70 | Temp 98.1°F | Resp 16 | Ht 64.0 in | Wt 144.2 lb

## 2013-05-03 DIAGNOSIS — Z0000 Encounter for general adult medical examination without abnormal findings: Secondary | ICD-10-CM

## 2013-05-03 DIAGNOSIS — M899 Disorder of bone, unspecified: Secondary | ICD-10-CM

## 2013-05-03 LAB — BASIC METABOLIC PANEL
BUN: 24 mg/dL — ABNORMAL HIGH (ref 6–23)
CO2: 24 mEq/L (ref 19–32)
Chloride: 108 mEq/L (ref 96–112)
Creatinine, Ser: 0.7 mg/dL (ref 0.4–1.2)
Potassium: 3.4 mEq/L — ABNORMAL LOW (ref 3.5–5.1)

## 2013-05-03 LAB — LIPID PANEL
Cholesterol: 184 mg/dL (ref 0–200)
LDL Cholesterol: 83 mg/dL (ref 0–99)
Triglycerides: 87 mg/dL (ref 0.0–149.0)
VLDL: 17.4 mg/dL (ref 0.0–40.0)

## 2013-05-03 LAB — CBC WITH DIFFERENTIAL/PLATELET
Basophils Relative: 0.4 % (ref 0.0–3.0)
Eosinophils Relative: 0.8 % (ref 0.0–5.0)
Lymphocytes Relative: 31.3 % (ref 12.0–46.0)
Monocytes Relative: 4.4 % (ref 3.0–12.0)
Neutro Abs: 7.7 10*3/uL (ref 1.4–7.7)
Neutrophils Relative %: 63.1 % (ref 43.0–77.0)

## 2013-05-03 LAB — HEPATIC FUNCTION PANEL
ALT: 18 U/L (ref 0–35)
AST: 21 U/L (ref 0–37)
Total Bilirubin: 0.5 mg/dL (ref 0.3–1.2)
Total Protein: 6.9 g/dL (ref 6.0–8.3)

## 2013-05-03 NOTE — Assessment & Plan Note (Signed)
Pt's PE WNL.  UTD on mammo, colonoscopy.  Defers pap until next year.  Will order DEXA.  Check labs.  Anticipatory guidance provided.

## 2013-05-03 NOTE — Progress Notes (Signed)
  Subjective:    Patient ID: Megan Singleton, female    DOB: [birth year], 62 y.o.   MRN: [id number]  HPI CPE- UTD on colonoscopy, mammo.  Due for DEXA.  Pt prefers to do pap next year.     Review of Systems Patient reports no vision/ hearing changes, adenopathy,fever, weight change,  persistant/recurrent hoarseness , swallowing issues, chest pain, palpitations, edema, persistant/recurrent cough, hemoptysis, dyspnea (rest/exertional/paroxysmal nocturnal), gastrointestinal bleeding (melena, rectal bleeding), abdominal pain, significant heartburn, bowel changes, GU symptoms (dysuria, hematuria, incontinence), Gyn symptoms (abnormal  bleeding, pain),  syncope, focal weakness, memory loss, numbness & tingling, skin/hair/nail changes, abnormal bruising or bleeding, anxiety, or depression.     Objective:   Physical Exam  General Appearance:    Alert, cooperative, no distress, appears stated age  Head:    Normocephalic, without obvious abnormality, atraumatic  Eyes:    PERRL, conjunctiva/corneas clear, EOM's intact, fundi    benign, both eyes  Ears:    Normal TM's and external ear canals, both ears  Nose:   Nares normal, septum midline, mucosa normal, no drainage    or sinus tenderness  Throat:   Lips, mucosa, and tongue normal; teeth and gums normal  Neck:   Supple, symmetrical, trachea midline, no adenopathy;    Thyroid: no enlargement/tenderness/nodules  Back:     Symmetric, no curvature, ROM normal, no CVA tenderness  Lungs:     Clear to auscultation bilaterally, respirations unlabored  Chest Wall:    No tenderness or deformity   Heart:    Regular rate and rhythm, S1 and S2 normal, no murmur, rub   or gallop  Breast Exam:    No tenderness, masses, or nipple abnormality  Abdomen:     Soft, non-tender, bowel sounds active all four quadrants,    no masses, no organomegaly  Genitalia:    Deferred at pt's request  Rectal:    Extremities:   Extremities normal, atraumatic, no cyanosis or  edema  Pulses:   2+ and symmetric all extremities  Skin:   Skin color, texture, turgor normal, no rashes or lesions  Lymph nodes:   Cervical, supraclavicular, and axillary nodes normal  Neurologic:   CNII-XII intact, normal strength, sensation and reflexes    throughout          Assessment & Plan:

## 2013-05-03 NOTE — Patient Instructions (Signed)
Follow up in 6 months to recheck Cholesterol We'll notify you of your lab results and make any changes if needed Schedule your derm visit for a yearly once over Call with any questions or concerns Happy early birthday!

## 2013-05-03 NOTE — Assessment & Plan Note (Signed)
Check Vit D, get DEXA

## 2013-05-04 ENCOUNTER — Other Ambulatory Visit: Payer: Self-pay | Admitting: General Practice

## 2013-05-04 DIAGNOSIS — D72820 Lymphocytosis (symptomatic): Secondary | ICD-10-CM

## 2013-05-04 MED ORDER — LEVOTHYROXINE SODIUM 88 MCG PO TABS: 88.0000 ug | ORAL_TABLET | Freq: Every day | ORAL | Status: DC

## 2013-05-07 LAB — VITAMIN D 1,25 DIHYDROXY: Vitamin D2 1, 25 (OH)2: <8 pg/mL

## 2013-05-08 ENCOUNTER — Encounter: Payer: Self-pay | Admitting: General Practice

## 2013-05-17 ENCOUNTER — Encounter: Payer: Self-pay | Admitting: General Practice

## 2013-05-17 ENCOUNTER — Other Ambulatory Visit (INDEPENDENT_AMBULATORY_CARE_PROVIDER_SITE_OTHER): Payer: PRIVATE HEALTH INSURANCE

## 2013-05-17 DIAGNOSIS — D72820 Lymphocytosis (symptomatic): Secondary | ICD-10-CM

## 2013-05-17 LAB — CBC WITH DIFFERENTIAL/PLATELET
Basophils Relative: 0.4 % (ref 0.0–3.0)
Eosinophils Relative: 1.3 % (ref 0.0–5.0)
HCT: 39.7 % (ref 36.0–46.0)
Hemoglobin: 13.5 g/dL (ref 12.0–15.0)
Lymphs Abs: 3.5 10*3/uL (ref 0.7–4.0)
Monocytes Relative: 4.2 % (ref 3.0–12.0)
Neutro Abs: 6.1 10*3/uL (ref 1.4–7.7)
RBC: 4.36 Mil/uL (ref 3.87–5.11)
RDW: 13.9 % (ref 11.5–14.6)
WBC: 10.2 10*3/uL (ref 4.5–10.5)

## 2013-05-29 ENCOUNTER — Other Ambulatory Visit: Payer: Self-pay | Admitting: Family Medicine

## 2013-05-29 NOTE — Telephone Encounter (Signed)
Med filled.  

## 2013-05-31 ENCOUNTER — Telehealth: Payer: Self-pay | Admitting: *Deleted

## 2013-05-31 NOTE — Telephone Encounter (Signed)
Patient called and stated that she was prescribed Protonix and states that's she isn't getting any relieve from it. She stated that she had mention that she would like to increase the dosage. Her symptoms includes burning sensation, choking while eating. Would like to know what she needs to do to get some relief? Please advise. SW

## 2013-05-31 NOTE — Telephone Encounter (Signed)
Patient states that she a;ready has a GI doctor. Advised patient to make an appointment with him and if she needs [place] to then we will provide another referral.

## 2013-05-31 NOTE — Telephone Encounter (Signed)
Left message on voicemail to return call to office

## 2013-05-31 NOTE — Telephone Encounter (Signed)
Needs GI referral

## 2013-06-13 ENCOUNTER — Encounter: Payer: Self-pay | Admitting: General Practice

## 2013-06-20 ENCOUNTER — Encounter: Payer: Self-pay | Admitting: General Practice

## 2013-06-23 ENCOUNTER — Telehealth: Payer: Self-pay

## 2013-06-23 NOTE — Telephone Encounter (Signed)
Call from patient who said she was Dx with Osteopenia and was told to take calcium and vitamin d but her Vitamin D levels were elevated. I advised per Dr.Tabori stop the Vitamin D for 3 mos and only take the calcium at this time. Patient voiced understanding.      KP

## 2013-06-29 ENCOUNTER — Encounter: Payer: Self-pay | Admitting: Family Medicine

## 2013-07-14 ENCOUNTER — Ambulatory Visit
Admission: RE | Admit: 2013-07-14 | Discharge: 2013-07-14 | Disposition: A | Discharge status: Home / Self Care | Payer: No Typology Code available for payment source | Source: Ambulatory Visit | Attending: Allergy and Immunology | Admitting: Allergy and Immunology

## 2013-07-14 ENCOUNTER — Other Ambulatory Visit: Payer: Self-pay | Admitting: Allergy and Immunology

## 2013-07-14 DIAGNOSIS — R05 Cough: Secondary | ICD-10-CM

## 2013-07-14 DIAGNOSIS — R059 Cough, unspecified: Secondary | ICD-10-CM

## 2013-08-22 ENCOUNTER — Other Ambulatory Visit: Payer: Self-pay | Admitting: Family Medicine

## 2013-08-22 NOTE — Telephone Encounter (Signed)
Med filled.  

## 2013-08-31 ENCOUNTER — Other Ambulatory Visit: Payer: Self-pay | Admitting: Family Medicine

## 2013-08-31 NOTE — Telephone Encounter (Signed)
Med filled. Letter mailed to notify pt need for appt.

## 2013-09-11 ENCOUNTER — Encounter: Payer: Self-pay | Admitting: Family Medicine

## 2013-09-18 ENCOUNTER — Ambulatory Visit: Payer: No Typology Code available for payment source | Admitting: Family Medicine

## 2013-09-18 DIAGNOSIS — Z0289 Encounter for other administrative examinations: Secondary | ICD-10-CM

## 2013-09-22 ENCOUNTER — Encounter: Payer: Self-pay | Admitting: Family Medicine

## 2013-09-25 ENCOUNTER — Ambulatory Visit: Payer: PRIVATE HEALTH INSURANCE | Admitting: Physician Assistant

## 2013-09-25 ENCOUNTER — Ambulatory Visit (INDEPENDENT_AMBULATORY_CARE_PROVIDER_SITE_OTHER): Payer: No Typology Code available for payment source | Admitting: Physician Assistant

## 2013-09-25 ENCOUNTER — Encounter: Payer: Self-pay | Admitting: Physician Assistant

## 2013-09-25 VITALS — BP 100/60 | HR 80 | Temp 98.1°F | Resp 16 | Ht 64.0 in | Wt 154.0 lb

## 2013-09-25 DIAGNOSIS — R059 Cough, unspecified: Secondary | ICD-10-CM

## 2013-09-25 DIAGNOSIS — R05 Cough: Secondary | ICD-10-CM

## 2013-09-25 MED ORDER — HYDROCOD POLST-CHLORPHEN POLST 10-8 MG/5ML PO LQCR: 5.0000 mL | Freq: Two times a day (BID) | ORAL | Status: DC | PRN

## 2013-09-25 NOTE — Patient Instructions (Signed)
Please continue Protonix daily.  Avoid late-night eating.  Add a probiotic daily.  Elevate the head of your bed.  Take Tussionex as directed for cough.  Increase fluid intake.  Continue allergy medication.  Place a humidifier in the bedroom.  Call or return to clinic if symptoms are persistent.  Cough, Adult  A cough is a reflex that helps clear your throat and airways. It can help heal the body or may be a reaction to an irritated airway. A cough may only last 2 or 3 weeks (acute) or may last more than 8 weeks (chronic).  CAUSES Acute cough:  Viral or bacterial infections. Chronic cough:  Infections.  Allergies.  Asthma.  Post-nasal drip.  Smoking.  Heartburn or acid reflux.  Some medicines.  Chronic lung problems (COPD).  Cancer. SYMPTOMS   Cough.  Fever.  Chest pain.  Increased breathing rate.  High-pitched whistling sound when breathing (wheezing).  Colored mucus that you cough up (sputum). TREATMENT   A bacterial cough may be treated with antibiotic medicine.  A viral cough must run its course and will not respond to antibiotics.  Your caregiver may recommend other treatments if you have a chronic cough. HOME CARE INSTRUCTIONS   Only take over-the-counter or prescription medicines for pain, discomfort, or fever as directed by your caregiver. Use cough suppressants only as directed by your caregiver.  Use a cold steam vaporizer or humidifier in your bedroom or home to help loosen secretions.  Sleep in a semi-upright position if your cough is worse at night.  Rest as needed.  Stop smoking if you smoke. SEEK IMMEDIATE MEDICAL CARE IF:   You have pus in your sputum.  Your cough starts to worsen.  You cannot control your cough with suppressants and are losing sleep.  You begin coughing up blood.  You have difficulty breathing.  You develop pain which is getting worse or is uncontrolled with medicine.  You have a fever. MAKE SURE YOU:    Understand these instructions.  Will watch your condition.  Will get help right away if you are not doing well or get worse. Document Released: 01/02/2011 Document Revised: 09/28/2011 Document Reviewed: 01/02/2011 [facility] Patient Information 2014 [place], [place].

## 2013-09-25 NOTE — Progress Notes (Signed)
Pre visit review using our clinic review tool, if applicable. No additional management support is needed unless otherwise documented below in the visit note/SLS  

## 2013-09-25 NOTE — Progress Notes (Signed)
Patient presents to clinic today c/o non-productive cough and fatigue x 4 days.  Patient denies chest pain, shortness of breath, wheezing, recent travel or sick contact.  Denies fever, chills or aches.  Endorses mild postnasal drip.  Denies sinus pressure, sinus pain, ear pain or tooth pain.  Denies recent surgery, prolonged immobilization or long-distance travel.  Denies hx of DVT or PE.  Patient with history of GERD, taking Protonix daily.  Denies heart burn, globus or epigastric pain.  Past Medical History  Diagnosis Date  . GERD (gastroesophageal reflux disease)   . Allergy   . Thyroid disease   . Osteopenia   . Seizure disorder     she is on life long medication per Neuro and can not have driving privileges if she  stops meds  . Hyperlipidemia   . Fibromyalgia   . IBS (irritable bowel syndrome)   . Alcohol abuse   . RLS (restless legs syndrome)   . Depression   . Hashimoto's thyroiditis   . Seizures     Current Outpatient Prescriptions on File Prior to Visit  Medication Sig Dispense Refill  . fluticasone (FLONASE) 50 MCG/ACT nasal spray USE 2 SPRAYS IN EACH NOSTRIL DAILY  16 g  0  . gabapentin (NEURONTIN) 600 MG tablet Take 1,200 mg by mouth at bedtime.       Marland Kitchen levothyroxine (SYNTHROID, LEVOTHROID) 88 MCG tablet TAKE 1 TABLET BY MOUTH EVERY DAY  30 tablet  0  . OSPHENA 60 MG TABS TAKE 1 TABLET BY MOUTH DAILY  30 tablet  0  . pantoprazole (PROTONIX) 40 MG tablet TAKE 1 TABLET BY MOUTH EVERY DAY  30 tablet  1  . PATANASE 0.6 % SOLN       . polyethylene glycol powder (GLYCOLAX/MIRALAX) powder Take 21 g by mouth daily.        . pramipexole (MIRAPEX) 1 MG tablet Take 3 mg by mouth at bedtime.        . simvastatin (ZOCOR) 20 MG tablet TAKE 1 TABLET BY MOUTH EVERY NIGHT AT BEDTIME  30 tablet  0  . venlafaxine (EFFEXOR XR) 150 MG 24 hr capsule Take 1 capsule (150 mg total) by mouth daily.  30 capsule  3  . venlafaxine (EFFEXOR) 75 MG tablet Take 75 mg by mouth daily.        Marland Kitchen zonisamide  (ZONEGRAN) 100 MG capsule Take 100 mg by mouth as directed. 2 tabs q AM and 4 tabs qhs      . pneumococcal 23 valent vaccine (PNEUMOVAX 23) 25 MCG/0.5ML injection Inject 0.5 mLs into the muscle once.  0.5 mL  0  . zoster vaccine live, PF, (ZOSTAVAX) 45409 UNT/0.65ML injection Inject 19,400 Units into the skin once.  1 vial  0   No current facility-administered medications on file prior to visit.    Allergies  Allergen Reactions  . Morphine     Family History  Problem Relation Age of Onset  . Breast cancer Mother   . Hyperlipidemia Mother   . Prostate cancer Father   . Hyperlipidemia Father   . Depression Father   . Cancer Cousin     Colon Cancer  . Diabetes Other     Maternal Grandparent  . Kidney disease Other     Maternal Grandparent  . Stroke Other     Grandparent  . Heart disease Other     Grand Parent    History   Social History  . Marital Status: Married  Spouse Name: N/A    Number of Children: N/A  . Years of Education: N/A   Social History Main Topics  . Smoking status: Former Smoker    Quit date: 07/21/1979  . Smokeless tobacco: Never Used  . Alcohol Use: Yes     Comment: 2 drinks per night  . Drug Use: No  . Sexual Activity: None   Other Topics Concern  . None   Social History Narrative  . None   Review of Systems - See HPI.  All other ROS are negative.  BP 100/60  Pulse 80  Temp(Src) 98.1 F (36.7 C) (Oral)  Resp 16  Ht 5\' 4"  (1.626 m)  Wt 154 lb (69.854 kg)  BMI 26.42 kg/m2  SpO2 96%  Physical Exam  Vitals reviewed. Constitutional: She is oriented to person, place, and time and well-developed, well-nourished, and in no distress.  HENT:  Head: Normocephalic and atraumatic.  Right Ear: External ear normal.  Left Ear: External ear normal.  Nose: Nose normal.  Mouth/Throat: Oropharynx is clear and moist. No oropharyngeal exudate.  TM within normal limits bilaterally.  No TTP of sinuses.  Eyes: Conjunctivae are normal. Pupils are  equal, round, and reactive to light.  Neck: Neck supple.  Cardiovascular: Normal rate, regular rhythm, normal heart sounds and intact distal pulses.   Pulmonary/Chest: Effort normal and breath sounds normal. No respiratory distress. She has no wheezes. She has no rales. She exhibits no tenderness.  Lymphadenopathy:    She has no cervical adenopathy.  Neurological: She is alert and oriented to person, place, and time.  Skin: Skin is warm and dry.  Psychiatric: Affect normal.    No results found for this or any previous visit (from the past 2160 hour(s)).  Assessment/Plan: Cough Possibly beginning of a viral URI.  Physical exam unremarkable.  Patient also with history of GERD. Rx Tussionex.  Continue Protonix.  Take two TUMS before bed.  Start a probiotic. Increase fluid intake.  Place a humidifier in the bedroom.  Return if symptoms not improving or if new symptoms develop.

## 2013-09-27 ENCOUNTER — Other Ambulatory Visit: Payer: Self-pay | Admitting: Family Medicine

## 2013-09-27 DIAGNOSIS — R05 Cough: Secondary | ICD-10-CM | POA: Insufficient documentation

## 2013-09-27 DIAGNOSIS — R059 Cough, unspecified: Secondary | ICD-10-CM | POA: Insufficient documentation

## 2013-09-27 NOTE — Assessment & Plan Note (Signed)
Possibly beginning of a viral URI.  Physical exam unremarkable.  Patient also with history of GERD. Rx Tussionex.  Continue Protonix.  Take two TUMS before bed.  Start a probiotic. Increase fluid intake.  Place a humidifier in the bedroom.  Return if symptoms not improving or if new symptoms develop.

## 2013-09-27 NOTE — Telephone Encounter (Signed)
Med filled.  

## 2013-09-28 ENCOUNTER — Encounter: Payer: Self-pay | Admitting: Family Medicine

## 2013-10-05 ENCOUNTER — Encounter: Payer: Self-pay | Admitting: Physician Assistant

## 2013-10-27 ENCOUNTER — Other Ambulatory Visit: Payer: Self-pay | Admitting: Family Medicine

## 2013-10-27 NOTE — Telephone Encounter (Signed)
Med filled.  

## 2013-11-02 ENCOUNTER — Ambulatory Visit: Payer: PRIVATE HEALTH INSURANCE | Admitting: Family Medicine

## 2013-11-03 ENCOUNTER — Ambulatory Visit: Payer: PRIVATE HEALTH INSURANCE | Admitting: Family Medicine

## 2013-11-15 ENCOUNTER — Ambulatory Visit (INDEPENDENT_AMBULATORY_CARE_PROVIDER_SITE_OTHER): Payer: No Typology Code available for payment source | Admitting: Family Medicine

## 2013-11-15 ENCOUNTER — Encounter: Payer: Self-pay | Admitting: Family Medicine

## 2013-11-15 VITALS — BP 130/88 | HR 80 | Temp 98.6°F | Resp 16 | Wt 160.1 lb

## 2013-11-15 DIAGNOSIS — E785 Hyperlipidemia, unspecified: Secondary | ICD-10-CM

## 2013-11-15 DIAGNOSIS — G43909 Migraine, unspecified, not intractable, without status migrainosus: Secondary | ICD-10-CM

## 2013-11-15 DIAGNOSIS — N3946 Mixed incontinence: Secondary | ICD-10-CM

## 2013-11-15 DIAGNOSIS — Z23 Encounter for immunization: Secondary | ICD-10-CM

## 2013-11-15 MED ORDER — PROMETHAZINE HCL 25 MG/ML IJ SOLN
25.0000 mg | Freq: Once | INTRAMUSCULAR | Status: AC
  Administered 2013-11-15: 25 mg via INTRAMUSCULAR

## 2013-11-15 MED ORDER — KETOROLAC TROMETHAMINE 60 MG/2ML IM SOLN
60.0000 mg | Freq: Once | INTRAMUSCULAR | Status: AC
  Administered 2013-11-15: 60 mg via INTRAMUSCULAR

## 2013-11-15 NOTE — Progress Notes (Signed)
Pre visit review using our clinic review tool, if applicable. No additional management support is needed unless otherwise documented below in the visit note. 

## 2013-11-15 NOTE — Patient Instructions (Signed)
Schedule your complete physical in 6 months We'll notify you of your lab results and make any changes if needed We'll call you with your Urology appt Call and schedule w/ ortho for the knee Call with any questions or concerns Happy Spring!

## 2013-11-15 NOTE — Progress Notes (Signed)
   Subjective:    Patient ID: Megan Singleton, female    DOB: 10/06/1950, 63 y.o.   MRN: [id number]  HPI Hyperlipidemia- chronic problem, on Zocor.  Denies abd pain, N/V, myalgias.    Mixed incontinence- pt had previously seen [facility].  Was having benefit from acupuncture but then ran into insurance issues and had to stop.  Now is again 'wetting myself'.  Would like referral back to urology.   Review of Systems For ROS see HPI     Objective:   Physical Exam  Vitals reviewed. Constitutional: She is oriented to person, place, and time. She appears well-developed and well-nourished. No distress.  HENT:  Head: Normocephalic and atraumatic.  Eyes: Conjunctivae and EOM are normal. Pupils are equal, round, and reactive to light.  Neck: Normal range of motion. Neck supple. No thyromegaly present.  Cardiovascular: Normal rate, regular rhythm, normal heart sounds and intact distal pulses.   No murmur heard. Pulmonary/Chest: Effort normal and breath sounds normal. No respiratory distress.  Abdominal: Soft. She exhibits no distension. There is no tenderness.  Musculoskeletal: She exhibits no edema.  Lymphadenopathy:    She has no cervical adenopathy.  Neurological: She is alert and oriented to person, place, and time.  Skin: Skin is warm and dry.  Psychiatric: She has a normal mood and affect. Her behavior is normal.          Assessment & Plan:

## 2013-11-16 ENCOUNTER — Encounter: Payer: Self-pay | Admitting: Family Medicine

## 2013-11-16 LAB — BASIC METABOLIC PANEL
BUN: 23 mg/dL (ref 6–23)
CO2: 24 mEq/L (ref 19–32)
Calcium: 9 mg/dL (ref 8.4–10.5)
Chloride: 110 mEq/L (ref 96–112)
Creatinine, Ser: 0.7 mg/dL (ref 0.4–1.2)
GFR: 85.72 mL/min (ref >60.00)
GLUCOSE: 79 mg/dL (ref 70–99)
POTASSIUM: 4.2 meq/L (ref 3.5–5.1)
Sodium: 141 mEq/L (ref 135–145)

## 2013-11-16 LAB — LIPID PANEL
Cholesterol: 167 mg/dL (ref 0–200)
HDL: 69.5 mg/dL (ref >39.00)
LDL Cholesterol: 80 mg/dL (ref 0–99)
Total CHOL/HDL Ratio: 2
Triglycerides: 87 mg/dL (ref 0.0–149.0)
VLDL: 17.4 mg/dL (ref 0.0–40.0)

## 2013-11-16 LAB — HEPATIC FUNCTION PANEL
ALT: 15 U/L (ref 0–35)
AST: 22 U/L (ref 0–37)
Albumin: 3.9 g/dL (ref 3.5–5.2)
Alkaline Phosphatase: 81 U/L (ref 39–117)
Bilirubin, Direct: 0.1 mg/dL (ref 0.0–0.3)
Total Bilirubin: 0.4 mg/dL (ref 0.3–1.2)
Total Protein: 6.7 g/dL (ref 6.0–8.3)

## 2013-11-19 NOTE — Assessment & Plan Note (Signed)
Recurrent problem for pt.  Refer back to urology for evaluation and tx.

## 2013-11-19 NOTE — Assessment & Plan Note (Signed)
Chronic problem.  Tolerating statin w/o difficulty.  Check labs.  Adjust meds prn  

## 2013-12-07 ENCOUNTER — Encounter: Payer: Self-pay | Admitting: Family Medicine

## 2013-12-08 MED ORDER — LINACLOTIDE 145 MCG PO CAPS: 145.0000 ug | ORAL_CAPSULE | Freq: Every day | ORAL | Status: DC

## 2013-12-08 NOTE — Telephone Encounter (Signed)
Med filled per [employer].

## 2013-12-21 ENCOUNTER — Other Ambulatory Visit: Payer: Self-pay | Admitting: Gastroenterology

## 2013-12-21 DIAGNOSIS — R1310 Dysphagia, unspecified: Secondary | ICD-10-CM

## 2013-12-24 ENCOUNTER — Encounter: Payer: Self-pay | Admitting: Family Medicine

## 2013-12-28 ENCOUNTER — Ambulatory Visit
Admission: RE | Admit: 2013-12-28 | Discharge: 2013-12-28 | Disposition: A | Discharge status: Home / Self Care | Payer: No Typology Code available for payment source | Source: Ambulatory Visit | Attending: Gastroenterology | Admitting: Gastroenterology

## 2013-12-28 DIAGNOSIS — R1310 Dysphagia, unspecified: Secondary | ICD-10-CM

## 2014-02-20 ENCOUNTER — Other Ambulatory Visit: Payer: Self-pay | Admitting: Family Medicine

## 2014-02-20 NOTE — Telephone Encounter (Signed)
Med filled.  

## 2014-03-27 ENCOUNTER — Ambulatory Visit: Payer: No Typology Code available for payment source | Attending: Orthopedic Surgery | Admitting: Physical Therapy

## 2014-03-27 DIAGNOSIS — IMO0001 Reserved for inherently not codable concepts without codable children: Secondary | ICD-10-CM | POA: Diagnosis not present

## 2014-03-27 DIAGNOSIS — M25559 Pain in unspecified hip: Secondary | ICD-10-CM | POA: Insufficient documentation

## 2014-03-29 ENCOUNTER — Ambulatory Visit: Payer: No Typology Code available for payment source | Admitting: Physical Therapy

## 2014-03-29 DIAGNOSIS — IMO0001 Reserved for inherently not codable concepts without codable children: Secondary | ICD-10-CM | POA: Diagnosis not present

## 2014-04-05 ENCOUNTER — Ambulatory Visit: Payer: No Typology Code available for payment source | Admitting: Physical Therapy

## 2014-04-05 DIAGNOSIS — IMO0001 Reserved for inherently not codable concepts without codable children: Secondary | ICD-10-CM | POA: Diagnosis not present

## 2014-04-06 ENCOUNTER — Ambulatory Visit: Payer: No Typology Code available for payment source | Admitting: Physical Therapy

## 2014-04-06 DIAGNOSIS — IMO0001 Reserved for inherently not codable concepts without codable children: Secondary | ICD-10-CM | POA: Diagnosis not present

## 2014-04-09 ENCOUNTER — Ambulatory Visit: Payer: No Typology Code available for payment source | Admitting: Physical Therapy

## 2014-04-09 DIAGNOSIS — IMO0001 Reserved for inherently not codable concepts without codable children: Secondary | ICD-10-CM | POA: Diagnosis not present

## 2014-04-12 ENCOUNTER — Ambulatory Visit: Payer: No Typology Code available for payment source | Admitting: Physical Therapy

## 2014-04-13 ENCOUNTER — Ambulatory Visit: Payer: No Typology Code available for payment source | Admitting: Physical Therapy

## 2014-04-13 DIAGNOSIS — IMO0001 Reserved for inherently not codable concepts without codable children: Secondary | ICD-10-CM | POA: Diagnosis not present

## 2014-04-17 ENCOUNTER — Ambulatory Visit: Payer: No Typology Code available for payment source | Admitting: Physical Therapy

## 2014-04-17 DIAGNOSIS — IMO0001 Reserved for inherently not codable concepts without codable children: Secondary | ICD-10-CM | POA: Diagnosis not present

## 2014-04-20 ENCOUNTER — Ambulatory Visit: Payer: No Typology Code available for payment source | Admitting: Physical Therapy

## 2014-05-13 LAB — HM MAMMOGRAPHY: HM Mammogram: NORMAL

## 2014-05-21 ENCOUNTER — Encounter: Payer: Self-pay | Admitting: Family Medicine

## 2014-05-21 ENCOUNTER — Other Ambulatory Visit: Payer: Self-pay | Admitting: General Practice

## 2014-05-21 ENCOUNTER — Ambulatory Visit (INDEPENDENT_AMBULATORY_CARE_PROVIDER_SITE_OTHER): Payer: No Typology Code available for payment source | Admitting: Family Medicine

## 2014-05-21 ENCOUNTER — Other Ambulatory Visit (HOSPITAL_COMMUNITY)
Admission: RE | Admit: 2014-05-21 | Discharge: 2014-05-21 | Disposition: A | Discharge status: Home / Self Care | Payer: No Typology Code available for payment source | Source: Ambulatory Visit | Attending: Family Medicine | Admitting: Family Medicine

## 2014-05-21 VITALS — BP 122/82 | HR 78 | Temp 98.1°F | Resp 16 | Ht 64.0 in | Wt 154.1 lb

## 2014-05-21 DIAGNOSIS — Z01419 Encounter for gynecological examination (general) (routine) without abnormal findings: Secondary | ICD-10-CM | POA: Insufficient documentation

## 2014-05-21 DIAGNOSIS — Z1151 Encounter for screening for human papillomavirus (HPV): Secondary | ICD-10-CM | POA: Diagnosis present

## 2014-05-21 DIAGNOSIS — E038 Other specified hypothyroidism: Secondary | ICD-10-CM

## 2014-05-21 DIAGNOSIS — Z0000 Encounter for general adult medical examination without abnormal findings: Secondary | ICD-10-CM

## 2014-05-21 DIAGNOSIS — R87810 Cervical high risk human papillomavirus (HPV) DNA test positive: Secondary | ICD-10-CM | POA: Insufficient documentation

## 2014-05-21 DIAGNOSIS — Z124 Encounter for screening for malignant neoplasm of cervix: Secondary | ICD-10-CM | POA: Insufficient documentation

## 2014-05-21 LAB — HEPATIC FUNCTION PANEL
ALT: 15 U/L (ref 0–35)
AST: 20 U/L (ref 0–37)
Albumin: 3.6 g/dL (ref 3.5–5.2)
Alkaline Phosphatase: 78 U/L (ref 39–117)
Bilirubin, Direct: 0 mg/dL (ref 0.0–0.3)
Total Bilirubin: 0.8 mg/dL (ref 0.2–1.2)
Total Protein: 6.9 g/dL (ref 6.0–8.3)

## 2014-05-21 LAB — VITAMIN D 25 HYDROXY (VIT D DEFICIENCY, FRACTURES): VITD: 36.12 ng/mL (ref 30.00–100.00)

## 2014-05-21 LAB — CBC WITH DIFFERENTIAL/PLATELET
Basophils Absolute: 0.1 10*3/uL (ref 0.0–0.1)
Basophils Relative: 0.6 % (ref 0.0–3.0)
EOS PCT: 1.3 % (ref 0.0–5.0)
Eosinophils Absolute: 0.1 10*3/uL (ref 0.0–0.7)
HEMATOCRIT: 40.5 % (ref 36.0–46.0)
Hemoglobin: 13.6 g/dL (ref 12.0–15.0)
LYMPHS ABS: 4.1 10*3/uL — AB (ref 0.7–4.0)
Lymphocytes Relative: 43.9 % (ref 12.0–46.0)
MCHC: 33.6 g/dL (ref 30.0–36.0)
MCV: 93.4 fl (ref 78.0–100.0)
MONOS PCT: 4.6 % (ref 3.0–12.0)
Monocytes Absolute: 0.4 10*3/uL (ref 0.1–1.0)
Neutro Abs: 4.7 10*3/uL (ref 1.4–7.7)
Neutrophils Relative %: 49.6 % (ref 43.0–77.0)
Platelets: 260 10*3/uL (ref 150.0–400.0)
RBC: 4.33 Mil/uL (ref 3.87–5.11)
RDW: 13.7 % (ref 11.5–15.5)
WBC: 9.4 10*3/uL (ref 4.0–10.5)

## 2014-05-21 LAB — LIPID PANEL
Cholesterol: 195 mg/dL (ref 0–200)
HDL: 78.6 mg/dL (ref >39.00)
LDL CALC: 102 mg/dL — AB (ref 0–99)
NonHDL: 116.4
TRIGLYCERIDES: 70 mg/dL (ref 0.0–149.0)
Total CHOL/HDL Ratio: 2
VLDL: 14 mg/dL (ref 0.0–40.0)

## 2014-05-21 LAB — TSH: TSH: 10.89 u[IU]/mL — ABNORMAL HIGH (ref 0.35–4.50)

## 2014-05-21 LAB — BASIC METABOLIC PANEL
BUN: 20 mg/dL (ref 6–23)
CO2: 25 mEq/L (ref 19–32)
CREATININE: 0.8 mg/dL (ref 0.4–1.2)
Calcium: 9.3 mg/dL (ref 8.4–10.5)
Chloride: 109 mEq/L (ref 96–112)
GFR: 72.78 mL/min (ref >60.00)
Glucose, Bld: 84 mg/dL (ref 70–99)
Potassium: 4.2 mEq/L (ref 3.5–5.1)
Sodium: 141 mEq/L (ref 135–145)

## 2014-05-21 MED ORDER — LEVOTHYROXINE SODIUM 100 MCG PO TABS: 100.0000 ug | ORAL_TABLET | Freq: Every day | ORAL | Status: DC

## 2014-05-21 NOTE — Progress Notes (Signed)
   Subjective:    Patient ID: Megan Singleton, female    DOB: 07/21/1950, 63 y.o.   MRN: [id number]  HPI CPE- UTD on mammo, colonoscopy, DEXA.  Due for pap.   Review of Systems Patient reports no vision/ hearing changes, adenopathy,fever, weight change,  persistant/recurrent hoarseness , swallowing issues, chest pain, palpitations, edema, persistant/recurrent cough, hemoptysis, dyspnea (rest/exertional/paroxysmal nocturnal), gastrointestinal bleeding (melena, rectal bleeding), abdominal pain, significant heartburn, bowel changes, GU symptoms (dysuria, hematuria, incontinence), Gyn symptoms (abnormal  bleeding, pain),  syncope, focal weakness, memory loss, numbness & tingling, skin/hair/nail changes, abnormal bruising or bleeding, anxiety, or depression.     Objective:   Physical Exam  General Appearance:    Alert, cooperative, no distress, appears stated age  Head:    Normocephalic, without obvious abnormality, atraumatic  Eyes:    PERRL, conjunctiva/corneas clear, EOM's intact, fundi    benign, both eyes  Ears:    Normal TM's and external ear canals, both ears  Nose:   Nares normal, septum midline, mucosa normal, no drainage    or sinus tenderness  Throat:   Lips, mucosa, and tongue normal; teeth and gums normal  Neck:   Supple, symmetrical, trachea midline, no adenopathy;    Thyroid: no enlargement/tenderness/nodules  Back:     Symmetric, no curvature, ROM normal, no CVA tenderness  Lungs:     Clear to auscultation bilaterally, respirations unlabored  Chest Wall:    No tenderness or deformity   Heart:    Regular rate and rhythm, S1 and S2 normal, no murmur, rub   or gallop  Breast Exam:    No tenderness, masses, or nipple abnormality  Abdomen:     Soft, non-tender, bowel sounds active all four quadrants,    no masses, no organomegaly  Genitalia:    External genitalia normal, cervix normal in appearance, no CMT, uterus in normal size and position, adnexa w/out mass or tenderness,  mucosa pink and moist, no lesions or discharge present  Rectal:    Normal external appearance  Extremities:   Extremities normal, atraumatic, no cyanosis or edema  Pulses:   2+ and symmetric all extremities  Skin:   Skin color, texture, turgor normal, no rashes or lesions  Lymph nodes:   Cervical, supraclavicular, and axillary nodes normal  Neurologic:   CNII-XII intact, normal strength, sensation and reflexes    throughout          Assessment & Plan:

## 2014-05-21 NOTE — Progress Notes (Signed)
Pre visit review using our clinic review tool, if applicable. No additional management support is needed unless otherwise documented below in the visit note. 

## 2014-05-21 NOTE — Assessment & Plan Note (Signed)
Pt's PE WNL.  UTD on mammo, DEXA, colonoscopy.  Check labs.  Anticipatory guidance provided.  

## 2014-05-21 NOTE — Assessment & Plan Note (Signed)
Pap collected. 

## 2014-05-21 NOTE — Patient Instructions (Signed)
Follow up in 6 months to recheck cholesterol We'll notify you of your lab results and make any changes if needed Keep up the good work on healthy diet and regular exercise Schedule your pap for the end of this month Call with any questions or concerns Happy Early [place] [employer]uch[employer]irthday!

## 2014-05-23 ENCOUNTER — Encounter: Payer: Self-pay | Admitting: Family Medicine

## 2014-05-24 ENCOUNTER — Other Ambulatory Visit: Payer: Self-pay | Admitting: Family Medicine

## 2014-05-24 DIAGNOSIS — B977 Papillomavirus as the cause of diseases classified elsewhere: Secondary | ICD-10-CM

## 2014-05-24 LAB — CYTOLOGY - PAP

## 2014-05-25 ENCOUNTER — Encounter: Payer: Self-pay | Admitting: Family Medicine

## 2014-06-04 LAB — HM PAP SMEAR: HM PAP: HIGH

## 2014-06-21 ENCOUNTER — Other Ambulatory Visit: Payer: Self-pay | Admitting: Family Medicine

## 2014-06-22 NOTE — Telephone Encounter (Signed)
Med filled.  

## 2014-07-05 ENCOUNTER — Encounter: Payer: Self-pay | Admitting: General Practice

## 2014-07-15 ENCOUNTER — Other Ambulatory Visit: Payer: Self-pay | Admitting: Family Medicine

## 2014-07-16 NOTE — Telephone Encounter (Signed)
Medication Detail      Disp Refills Start End     OSPHENA 60 MG TABS 30 tablet 3 02/20/2014     Sig: TAKE 1 TABLET BY MOUTH EVERY DAY    E-Prescribing Status: Receipt confirmed by pharmacy (02/20/2014 11:31 AM EDT)      Rx request to pharmacy/SLS

## 2014-07-17 ENCOUNTER — Other Ambulatory Visit: Payer: Self-pay | Admitting: Family Medicine

## 2014-07-18 NOTE — Telephone Encounter (Signed)
Med filled.  

## 2014-08-02 LAB — HM MAMMOGRAPHY

## 2014-08-05 ENCOUNTER — Encounter: Payer: Self-pay | Admitting: Family Medicine

## 2014-08-06 ENCOUNTER — Other Ambulatory Visit (INDEPENDENT_AMBULATORY_CARE_PROVIDER_SITE_OTHER): Payer: BLUE CROSS/BLUE SHIELD

## 2014-08-06 DIAGNOSIS — E038 Other specified hypothyroidism: Secondary | ICD-10-CM

## 2014-08-07 ENCOUNTER — Other Ambulatory Visit: Payer: Self-pay | Admitting: General Practice

## 2014-08-07 DIAGNOSIS — E038 Other specified hypothyroidism: Secondary | ICD-10-CM

## 2014-08-07 LAB — TSH: TSH: 5.01 u[IU]/mL — AB (ref 0.35–4.50)

## 2014-08-07 MED ORDER — LEVOTHYROXINE SODIUM 112 MCG PO TABS: 112.0000 ug | ORAL_TABLET | Freq: Every day | ORAL | Status: DC

## 2014-08-20 ENCOUNTER — Encounter: Payer: Self-pay | Admitting: Family Medicine

## 2014-08-27 ENCOUNTER — Encounter: Payer: Self-pay | Admitting: Family Medicine

## 2014-08-29 ENCOUNTER — Encounter: Payer: Self-pay | Admitting: General Practice

## 2014-08-29 ENCOUNTER — Telehealth: Payer: Self-pay | Admitting: General Practice

## 2014-08-29 NOTE — Telephone Encounter (Signed)
Called pt and LMOVM to advise that due to amount of concerns we moved her appt to 8:30am and gave her 30 minutes. Also sent pt a mychart message to inform.

## 2014-08-30 ENCOUNTER — Ambulatory Visit (INDEPENDENT_AMBULATORY_CARE_PROVIDER_SITE_OTHER): Payer: BLUE CROSS/BLUE SHIELD | Admitting: Family Medicine

## 2014-08-30 ENCOUNTER — Encounter: Payer: Self-pay | Admitting: Family Medicine

## 2014-08-30 ENCOUNTER — Other Ambulatory Visit: Payer: Self-pay | Admitting: General Practice

## 2014-08-30 ENCOUNTER — Ambulatory Visit: Payer: Self-pay | Admitting: Family Medicine

## 2014-08-30 VITALS — BP 120/78 | HR 76 | Temp 98.2°F | Resp 16 | Wt 162.4 lb

## 2014-08-30 DIAGNOSIS — G40909 Epilepsy, unspecified, not intractable, without status epilepticus: Secondary | ICD-10-CM

## 2014-08-30 DIAGNOSIS — E038 Other specified hypothyroidism: Secondary | ICD-10-CM

## 2014-08-30 DIAGNOSIS — R61 Generalized hyperhidrosis: Secondary | ICD-10-CM

## 2014-08-30 LAB — CBC WITH DIFFERENTIAL/PLATELET
BASOS PCT: 0.6 % (ref 0.0–3.0)
Basophils Absolute: 0.1 10*3/uL (ref 0.0–0.1)
Eosinophils Absolute: 0.1 10*3/uL (ref 0.0–0.7)
Eosinophils Relative: 1 % (ref 0.0–5.0)
HCT: 41.7 % (ref 36.0–46.0)
Hemoglobin: 14 g/dL (ref 12.0–15.0)
LYMPHS ABS: 3.4 10*3/uL (ref 0.7–4.0)
Lymphocytes Relative: 33.2 % (ref 12.0–46.0)
MCHC: 33.5 g/dL (ref 30.0–36.0)
MCV: 91.8 fl (ref 78.0–100.0)
Monocytes Absolute: 0.6 10*3/uL (ref 0.1–1.0)
Monocytes Relative: 5.6 % (ref 3.0–12.0)
Neutro Abs: 6.1 10*3/uL (ref 1.4–7.7)
Neutrophils Relative %: 59.6 % (ref 43.0–77.0)
PLATELETS: 247 10*3/uL (ref 150.0–400.0)
RBC: 4.54 Mil/uL (ref 3.87–5.11)
RDW: 13.5 % (ref 11.5–15.5)
WBC: 10.3 10*3/uL (ref 4.0–10.5)

## 2014-08-30 LAB — BASIC METABOLIC PANEL
BUN: 21 mg/dL (ref 6–23)
CALCIUM: 8.9 mg/dL (ref 8.4–10.5)
CO2: 24 mEq/L (ref 19–32)
CREATININE: 0.74 mg/dL (ref 0.40–1.20)
Chloride: 111 mEq/L (ref 96–112)
GFR: 84.17 mL/min (ref >60.00)
GLUCOSE: 84 mg/dL (ref 70–99)
Potassium: 4.2 mEq/L (ref 3.5–5.1)
Sodium: 141 mEq/L (ref 135–145)

## 2014-08-30 LAB — TSH: TSH: 7.37 u[IU]/mL — AB (ref 0.35–4.50)

## 2014-08-30 MED ORDER — LEVOTHYROXINE SODIUM 125 MCG PO TABS: 125.0000 ug | ORAL_TABLET | Freq: Every day | ORAL | Status: DC

## 2014-08-30 NOTE — Telephone Encounter (Signed)
Pt notified and made appt time.

## 2014-08-30 NOTE — Patient Instructions (Signed)
Follow up as needed We'll notify you of your lab results and make any changes if needed STOP the Osphena- this may be the cause of your sweating If no improvement in sweating after 2 weeks, call me so we can send you to endocrinology Make sure you discuss your anxiety/fear/epigastric rising w/ neuro Call with any questions or concerns Hang in there!!

## 2014-08-30 NOTE — Progress Notes (Signed)
Pre visit review using our clinic review tool, if applicable. No additional management support is needed unless otherwise documented below in the visit note. 

## 2014-08-30 NOTE — Assessment & Plan Note (Signed)
Chronic problem for pt.  It has been on her problem list for at least 6 yrs and likely predates that.  However her current sxs seem to be associated w/ starting Osphena.  Will stop meds and monitor for improvement.  If sxs don't improve, will refer to endo for w/u of possible pheo (given sxs of sweating, flushing, fear/anxiety)

## 2014-08-30 NOTE — Progress Notes (Signed)
   Subjective:    Patient ID: Efrain SellaGeorgeanne J Liebert, female    DOB: 11/14/1950, 64 y.o.   MRN: [id number]  HPI 'i always thought i had too much adrenaline'- pt reports the 'adrenaline thing' has been going on for over 5 yrs, the excessive sweating has been >1 yr.  Pt started having excessive sweating around the time she started Chilesphena.  Pt also reports increased anxiety and 'fear'.  Feels she may have 'epigastric rising'- has an appt upcoming w/ Dr Adelene IdlerFerraru (Neuro)  Pt has hx of hypothyroid and abnormal TSH.  Due for repeat.   Review of Systems For ROS see HPI   Reviewed meds, allergies, problem list, and PMH in chart     Objective:   Physical Exam  Constitutional: She is oriented to person, place, and time. She appears well-developed and well-nourished. No distress.  HENT:  Head: Normocephalic and atraumatic.  Eyes: Conjunctivae and EOM are normal. Pupils are equal, round, and reactive to light.  Neck: Normal range of motion. Neck supple. No thyromegaly present.  Cardiovascular: Normal rate, regular rhythm, normal heart sounds and intact distal pulses.   No murmur heard. Pulmonary/Chest: Effort normal and breath sounds normal. No respiratory distress.  Abdominal: Soft. She exhibits no distension. There is no tenderness.  Musculoskeletal: She exhibits no edema.  Lymphadenopathy:    She has no cervical adenopathy.  Neurological: She is alert and oriented to person, place, and time.  Skin: Skin is warm and dry.  Psychiatric: She has a normal mood and affect. Her behavior is normal.  Vitals reviewed.         Assessment & Plan:

## 2014-08-30 NOTE — Assessment & Plan Note (Signed)
Chronic problem.  Still in process of adjusting pt's medication dose based on TSH levels.  This may play a role in some of her sxs.  Check labs.  Adjust meds prn

## 2014-08-30 NOTE — Assessment & Plan Note (Signed)
Chronic problem.  Has appt upcoming w/ Neuro.  Pt is reporting sxs of 'epigastric rising' which is an abdominal aura of a focal seizure.  Encouraged her to discuss this at her upcoming appt so that the appropriate steps can be taken.  Pt expressed understanding and is in agreement w/ plan.

## 2014-08-31 ENCOUNTER — Encounter: Payer: Self-pay | Admitting: Family Medicine

## 2014-09-05 ENCOUNTER — Other Ambulatory Visit: Payer: Self-pay | Admitting: Dermatology

## 2014-09-07 ENCOUNTER — Encounter: Payer: Self-pay | Admitting: Family Medicine

## 2014-09-10 ENCOUNTER — Encounter: Payer: Self-pay | Admitting: Family Medicine

## 2014-09-28 ENCOUNTER — Other Ambulatory Visit (INDEPENDENT_AMBULATORY_CARE_PROVIDER_SITE_OTHER): Payer: BLUE CROSS/BLUE SHIELD

## 2014-09-28 DIAGNOSIS — E038 Other specified hypothyroidism: Secondary | ICD-10-CM

## 2014-09-28 LAB — TSH: TSH: 1.05 u[IU]/mL (ref 0.35–4.50)

## 2014-11-19 ENCOUNTER — Ambulatory Visit (INDEPENDENT_AMBULATORY_CARE_PROVIDER_SITE_OTHER): Payer: BLUE CROSS/BLUE SHIELD | Admitting: Family Medicine

## 2014-11-19 ENCOUNTER — Encounter: Payer: Self-pay | Admitting: Family Medicine

## 2014-11-19 VITALS — BP 122/78 | HR 81 | Temp 98.1°F | Resp 16 | Wt 161.2 lb

## 2014-11-19 DIAGNOSIS — F410 Panic disorder [episodic paroxysmal anxiety] without agoraphobia: Secondary | ICD-10-CM | POA: Diagnosis not present

## 2014-11-19 DIAGNOSIS — E785 Hyperlipidemia, unspecified: Secondary | ICD-10-CM

## 2014-11-19 LAB — TSH: TSH: 0.28 u[IU]/mL — ABNORMAL LOW (ref 0.35–4.50)

## 2014-11-19 MED ORDER — ALPRAZOLAM 0.5 MG PO TABS: 0.5000 mg | ORAL_TABLET | Freq: Two times a day (BID) | ORAL | Status: DC | PRN

## 2014-11-19 NOTE — Patient Instructions (Addendum)
Schedule your complete physical in 6 months We'll notify you of your lab results and make any changes if needed Start the Alprazolam when you have the onset of a panic attack- if no improvement, please call me! Keep up the good work on healthy diet and regular exercise Call with any questions or concerns Happy Spring!!!

## 2014-11-19 NOTE — Progress Notes (Signed)
   Subjective:    Patient ID: Megan Singleton, female    DOB: 08/17/1950, 64 y.o.   MRN: [id number]  HPI Hyperlipidemia- chronic problem, on Simvastatin.  No CP, SOB, HAs, visual changes, edema, abd pain, N/V, myalgias.  Continues to garden regularly.  Panic attacks- pt reports she is having attacks ~1x/week, 'i'm just scared to death'.  Pt reports sxs start in abd, radiates up into chest, across shoulders and down into arms that go numb, 'and then the fear starts'.  sxs will last for ~15 minutes.  Last attack occurred 1 week ago.  Also has hx of thyroid abnormality w/ recent dose adjustment  Review of Systems For ROS see HPI     Objective:   Physical Exam  Constitutional: She is oriented to person, place, and time. She appears well-developed and well-nourished. No distress.  HENT:  Head: Normocephalic and atraumatic.  Eyes: Conjunctivae and EOM are normal. Pupils are equal, round, and reactive to light.  Neck: Normal range of motion. Neck supple. No thyromegaly present.  Cardiovascular: Normal rate, regular rhythm, normal heart sounds and intact distal pulses.   No murmur heard. Pulmonary/Chest: Effort normal and breath sounds normal. No respiratory distress.  Abdominal: Soft. She exhibits no distension. There is no tenderness.  Musculoskeletal: She exhibits no edema.  Lymphadenopathy:    She has no cervical adenopathy.  Neurological: She is alert and oriented to person, place, and time.  Skin: Skin is warm and dry.  Psychiatric: She has a normal mood and affect. Her behavior is normal.  Vitals reviewed.         Assessment & Plan:

## 2014-11-19 NOTE — Progress Notes (Signed)
Pre visit review using our clinic review tool, if applicable. No additional management support is needed unless otherwise documented below in the visit note. 

## 2014-11-19 NOTE — Assessment & Plan Note (Signed)
New.  Pt reports this is ongoing issue for her and she has discussed it w/ neuro but no tx plan was ever given.  Based in the fact that sxs are occuring every 1-2 weeks, it is appropriate to use short acting benzo at onset of attack.  Encouraged pt to discuss this w/ therapist and keep journal to determine possible triggers.  Will follow.

## 2014-11-19 NOTE — Assessment & Plan Note (Signed)
Chronic problem.  Tolerating statin w/o difficulty.  Check labs.  Adjust meds prn  

## 2014-11-20 ENCOUNTER — Other Ambulatory Visit: Payer: Self-pay | Admitting: General Practice

## 2014-11-20 LAB — LIPID PANEL
Cholesterol: 172 mg/dL (ref 0–200)
HDL: 71.6 mg/dL (ref >39.00)
LDL Cholesterol: 80 mg/dL (ref 0–99)
NONHDL: 100.4
Total CHOL/HDL Ratio: 2
Triglycerides: 100 mg/dL (ref 0.0–149.0)
VLDL: 20 mg/dL (ref 0.0–40.0)

## 2014-11-20 LAB — HEPATIC FUNCTION PANEL
ALT: 18 U/L (ref 0–35)
AST: 18 U/L (ref 0–37)
Albumin: 4.1 g/dL (ref 3.5–5.2)
Alkaline Phosphatase: 99 U/L (ref 39–117)
BILIRUBIN DIRECT: 0 mg/dL (ref 0.0–0.3)
Total Bilirubin: 0.2 mg/dL (ref 0.2–1.2)
Total Protein: 6.7 g/dL (ref 6.0–8.3)

## 2014-11-20 LAB — BASIC METABOLIC PANEL
BUN: 19 mg/dL (ref 6–23)
CALCIUM: 9.4 mg/dL (ref 8.4–10.5)
CO2: 25 mEq/L (ref 19–32)
Chloride: 110 mEq/L (ref 96–112)
Creatinine, Ser: 0.73 mg/dL (ref 0.40–1.20)
GFR: 85.44 mL/min (ref >60.00)
Glucose, Bld: 83 mg/dL (ref 70–99)
POTASSIUM: 4.9 meq/L (ref 3.5–5.1)
SODIUM: 143 meq/L (ref 135–145)

## 2014-11-20 MED ORDER — LEVOTHYROXINE SODIUM 112 MCG PO TABS: 112.0000 ug | ORAL_TABLET | Freq: Every day | ORAL | Status: DC

## 2014-12-06 ENCOUNTER — Other Ambulatory Visit: Payer: Self-pay | Admitting: Obstetrics and Gynecology

## 2014-12-07 LAB — CYTOLOGY - PAP

## 2015-01-21 ENCOUNTER — Other Ambulatory Visit: Payer: Self-pay | Admitting: Family Medicine

## 2015-01-22 NOTE — Telephone Encounter (Signed)
Med filled.  

## 2015-03-05 ENCOUNTER — Other Ambulatory Visit: Payer: Self-pay | Admitting: Family Medicine

## 2015-03-05 NOTE — Telephone Encounter (Signed)
Medication filled to pharmacy as requested.   

## 2015-03-14 ENCOUNTER — Other Ambulatory Visit: Payer: Self-pay | Admitting: Family Medicine

## 2015-03-14 NOTE — Telephone Encounter (Signed)
Medication filled to pharmacy as requested.   

## 2015-05-15 ENCOUNTER — Encounter (HOSPITAL_COMMUNITY): Payer: Self-pay | Admitting: Emergency Medicine

## 2015-05-15 ENCOUNTER — Emergency Department (HOSPITAL_COMMUNITY)
Admission: EM | Admit: 2015-05-15 | Discharge: 2015-05-15 | Disposition: A | Discharge status: Home / Self Care | Payer: BLUE CROSS/BLUE SHIELD | Attending: Emergency Medicine | Admitting: Emergency Medicine

## 2015-05-15 DIAGNOSIS — F410 Panic disorder [episodic paroxysmal anxiety] without agoraphobia: Secondary | ICD-10-CM | POA: Diagnosis not present

## 2015-05-15 DIAGNOSIS — E063 Autoimmune thyroiditis: Secondary | ICD-10-CM | POA: Diagnosis not present

## 2015-05-15 DIAGNOSIS — K219 Gastro-esophageal reflux disease without esophagitis: Secondary | ICD-10-CM | POA: Insufficient documentation

## 2015-05-15 DIAGNOSIS — F1310 Sedative, hypnotic or anxiolytic abuse, uncomplicated: Secondary | ICD-10-CM | POA: Diagnosis not present

## 2015-05-15 DIAGNOSIS — M797 Fibromyalgia: Secondary | ICD-10-CM | POA: Insufficient documentation

## 2015-05-15 DIAGNOSIS — Z008 Encounter for other general examination: Secondary | ICD-10-CM | POA: Diagnosis present

## 2015-05-15 DIAGNOSIS — Z7951 Long term (current) use of inhaled steroids: Secondary | ICD-10-CM | POA: Insufficient documentation

## 2015-05-15 DIAGNOSIS — E785 Hyperlipidemia, unspecified: Secondary | ICD-10-CM | POA: Diagnosis not present

## 2015-05-15 DIAGNOSIS — F329 Major depressive disorder, single episode, unspecified: Secondary | ICD-10-CM | POA: Diagnosis not present

## 2015-05-15 DIAGNOSIS — Z79899 Other long term (current) drug therapy: Secondary | ICD-10-CM | POA: Diagnosis not present

## 2015-05-15 DIAGNOSIS — Z87891 Personal history of nicotine dependence: Secondary | ICD-10-CM | POA: Insufficient documentation

## 2015-05-15 DIAGNOSIS — G40909 Epilepsy, unspecified, not intractable, without status epilepticus: Secondary | ICD-10-CM | POA: Insufficient documentation

## 2015-05-15 DIAGNOSIS — F419 Anxiety disorder, unspecified: Secondary | ICD-10-CM

## 2015-05-15 LAB — CBC
HCT: 39.8 % (ref 36.0–46.0)
Hemoglobin: 13.6 g/dL (ref 12.0–15.0)
MCH: 30.9 pg (ref 26.0–34.0)
MCHC: 34.2 g/dL (ref 30.0–36.0)
MCV: 90.5 fL (ref 78.0–100.0)
PLATELETS: 204 10*3/uL (ref 150–400)
RBC: 4.4 MIL/uL (ref 3.87–5.11)
RDW: 13.2 % (ref 11.5–15.5)
WBC: 7.1 10*3/uL (ref 4.0–10.5)

## 2015-05-15 LAB — RAPID URINE DRUG SCREEN, HOSP PERFORMED
AMPHETAMINES: NOT DETECTED
Barbiturates: NOT DETECTED
Benzodiazepines: POSITIVE — AB
Cocaine: NOT DETECTED
Opiates: NOT DETECTED
Tetrahydrocannabinol: NOT DETECTED

## 2015-05-15 LAB — COMPREHENSIVE METABOLIC PANEL
ALK PHOS: 86 U/L (ref 38–126)
ALT: 21 U/L (ref 14–54)
AST: 22 U/L (ref 15–41)
Albumin: 4.1 g/dL (ref 3.5–5.0)
Anion gap: 7 (ref 5–15)
BUN: 20 mg/dL (ref 6–20)
CALCIUM: 9.4 mg/dL (ref 8.9–10.3)
CO2: 23 mmol/L (ref 22–32)
CREATININE: 0.55 mg/dL (ref 0.44–1.00)
Chloride: 114 mmol/L — ABNORMAL HIGH (ref 101–111)
Glucose, Bld: 123 mg/dL — ABNORMAL HIGH (ref 65–99)
Potassium: 3.6 mmol/L (ref 3.5–5.1)
Sodium: 144 mmol/L (ref 135–145)
Total Bilirubin: 0.3 mg/dL (ref 0.3–1.2)
Total Protein: 6.6 g/dL (ref 6.5–8.1)

## 2015-05-15 LAB — ACETAMINOPHEN LEVEL: Acetaminophen (Tylenol), Serum: <10 ug/mL — ABNORMAL LOW (ref 10–30)

## 2015-05-15 LAB — ETHANOL: Alcohol, Ethyl (B): 7 mg/dL — ABNORMAL HIGH (ref <5)

## 2015-05-15 LAB — SALICYLATE LEVEL

## 2015-05-15 MED ORDER — LORAZEPAM 1 MG PO TABS
1.0000 mg | ORAL_TABLET | Freq: Once | ORAL | Status: AC
  Administered 2015-05-15: 1 mg via ORAL
  Filled 2015-05-15: qty 1

## 2015-05-15 MED ORDER — ALPRAZOLAM 0.5 MG PO TABS: 0.5000 mg | ORAL_TABLET | Freq: Two times a day (BID) | ORAL | Status: DC | PRN

## 2015-05-15 NOTE — Discharge Instructions (Signed)

## 2015-05-15 NOTE — ED Notes (Signed)
Pt aware of need for urine sample.  

## 2015-05-15 NOTE — ED Notes (Signed)
Pt states she had a "major panic attack" beginning around 1900 this afternoon. States she took a 0.5 mg Xanax and a "big glass of alcohol" when she felt it coming on. States she has a hx of panic attacks, but has never had one this bad before. She thinks it was brought on after finding out she was going to have to put her cat down. Pt does not appear to be in any obvious distress in triage, vitals stable, not hyperventilating, not crying. States she does feel better at this time. Pt states she's afraid of going back to her house, "it's just scary. Something is going to get me." Denies suicidal/homicidal ideation.

## 2015-05-15 NOTE — ED Notes (Signed)
MD Walden at bedside 

## 2015-05-15 NOTE — ED Notes (Signed)
Pt alert,oriented, and ambulatory upon DC. She was advised to follow up with PCP in 2 days. Her husband is driving her home.

## 2015-05-15 NOTE — ED Provider Notes (Signed)
CSN: [id number]     Arrival date & time 05/15/15  2030 History   First MD Initiated Contact with Patient 05/15/15 2118     Chief Complaint  Patient presents with  . Panic Attack  . Anxiety  . Medical Clearance     (Consider location/radiation/quality/duration/timing/severity/associated sxs/prior Treatment) HPI Comments: She had a major panic attack tonight. She got worked up about a cat she recently had to put down. Hx of panic attacks, given xanax by her PCP, which she tried but it didn't work.   Patient is a 64 y.o. female presenting with anxiety. The history is provided by the patient.  Anxiety This is a recurrent problem. The current episode started 1 to 2 hours ago. The problem occurs constantly. The problem has not changed since onset.Pertinent negatives include no chest pain, no abdominal pain and no shortness of breath. Nothing aggravates the symptoms. Nothing relieves the symptoms.    Past Medical History  Diagnosis Date  . GERD (gastroesophageal reflux disease)   . Allergy   . Thyroid disease   . Osteopenia   . Seizure disorder [facility](HCC)     she is on life long medication per Neuro and can not have driving privileges if she  stops meds  . Hyperlipidemia   . Fibromyalgia   . IBS (irritable bowel syndrome)   . Alcohol abuse   . RLS (restless legs syndrome)   . Depression   . Hashimoto's thyroiditis   . Seizures [facility](HCC)    Past Surgical History  Procedure Laterality Date  . Cesarean section  [id number]    x's 2  . Wrist surgery      Right wrist---Benign mass  . Foot surgery      Left--Neuroma, Bunion  . Liposuction      abdominal  . Tonsillectomy      as a child  . Bunionectomy      Right and Hammertoe   Family History  Problem Relation Age of Onset  . Breast cancer Mother   . Hyperlipidemia Mother   . Prostate cancer Father   . Hyperlipidemia Father   . Depression Father   . Cancer Cousin     Colon Cancer  . Diabetes Other     Maternal Grandparent  .  Kidney disease Other     Maternal Grandparent  . Stroke Other     Grandparent  . Heart disease Other     Grand Parent   Social History  Substance Use Topics  . Smoking status: Former Smoker    Quit date: 07/21/1979  . Smokeless tobacco: Never Used  . Alcohol Use: Yes     Comment: 2 drinks per night   OB History    No data available     Review of Systems  Constitutional: Negative for fever.  Respiratory: Negative for cough, chest tightness and shortness of breath.   Cardiovascular: Negative for chest pain and leg swelling.  Gastrointestinal: Negative for vomiting and abdominal pain.  All other systems reviewed and are negative.     Allergies  Morphine  Home Medications   Prior to Admission medications   Medication Sig Start Date End Date Taking? Authorizing Provider  ALPRAZolam Prudy Feeler(XANAX) 0.5 MG tablet Take 1 tablet (0.5 mg total) by mouth 2 (two) times daily as needed for anxiety. 11/19/14   Sheliah HatchKatherine E Tabori, MD  desloratadine (CLARINEX) 5 MG tablet Take 1 tablet by mouth daily. 05/05/14   Historical Provider, MD  fluticasone (FLONASE) 50 MCG/ACT nasal spray USE 2  SPRAYS IN [facility] NOSTRIL DAILY 01/31/13   Sheliah Hatch, MD  gabapentin (NEURONTIN) 600 MG tablet Take 1,200 mg by mouth at bedtime.     Historical Provider, MD  hydrOXYzine (VISTARIL) 25 MG capsule Take 25 mg by mouth as needed.  05/18/14   Historical Provider, MD  lamoTRIgine (LAMICTAL) 200 MG tablet Take 200 mg by mouth daily.  10/30/14   Historical Provider, MD  levothyroxine (SYNTHROID, LEVOTHROID) 112 MCG tablet TAKE 1 TABLET(112 MCG) BY MOUTH DAILY 03/14/15   Sheliah Hatch, MD  MYRBETRIQ 50 MG TB24 tablet  08/18/14   Historical Provider, MD  pantoprazole (PROTONIX) 40 MG tablet TAKE 1 TABLET BY MOUTH EVERY DAY 03/05/15   Sheliah Hatch, MD  PATANASE 0.6 % SOLN Inhale 2 puffs into the lungs daily as needed.  04/28/13   Historical Provider, MD  polyethylene glycol powder (GLYCOLAX/MIRALAX) powder Take  2 times daily as needed .    Historical Provider, MD  pramipexole (MIRAPEX) 1 MG tablet Take 3 mg by mouth at bedtime. 4 times daily as needed.    Historical Provider, MD  simvastatin (ZOCOR) 20 MG tablet TAKE 1 TABLET BY MOUTH EVERY NIGHT AT BEDTIME 01/22/15   Sheliah Hatch, MD  venlafaxine XR (EFFEXOR-XR) 150 MG 24 hr capsule Pt takes 1 tablet in the morning and 1 tablet qhs 06/08/14   Historical Provider, MD  zonisamide (ZONEGRAN) 100 MG capsule Take 100 mg by mouth as directed. 2 tabs q AM and 4 tabs qhs    Historical Provider, MD   BP 148/87 mmHg  Pulse 105  Temp(Src) 98 F (36.7 C) (Oral)  Resp 18  SpO2 98% Physical Exam  Constitutional: She is oriented to person, place, and time. She appears well-developed and well-nourished. No distress.  HENT:  Head: Normocephalic and atraumatic.  Mouth/Throat: Oropharynx is clear and moist.  Eyes: EOM are normal. Pupils are equal, round, and reactive to light.  Neck: Normal range of motion. Neck supple.  Cardiovascular: Normal rate and regular rhythm.  Exam reveals no friction rub.   No murmur heard. Pulmonary/Chest: Effort normal and breath sounds normal. No respiratory distress. She has no wheezes. She has no rales.  Abdominal: Soft. She exhibits no distension. There is no tenderness. There is no rebound.  Musculoskeletal: Normal range of motion. She exhibits no edema.  Neurological: She is alert and oriented to person, place, and time.  Skin: Skin is warm. No rash noted. She is not diaphoretic.  Nursing note and vitals reviewed.   ED Course  Procedures (including critical care time) Labs Review Labs Reviewed  CBC  COMPREHENSIVE METABOLIC PANEL  ETHANOL  SALICYLATE LEVEL  ACETAMINOPHEN LEVEL  URINE RAPID DRUG SCREEN, HOSP PERFORMED    Imaging Review No results found. I have personally reviewed and evaluated these images and lab results as part of my medical decision-making.   EKG Interpretation None      MDM   Final  diagnoses:  Anxiety    35F here with a panic attack. History of the same. Worse tonight, no other associated symptoms. Without other symptoms, no need for EKG or labs. Exam is benign.  Feeling better after Ativan. Stable for discharge, given small amount of xanax to go home.  Elwin Mocha, MD 05/16/15 [phone]

## 2015-05-27 ENCOUNTER — Encounter: Payer: BLUE CROSS/BLUE SHIELD | Admitting: Family Medicine

## 2015-07-24 ENCOUNTER — Telehealth: Payer: Self-pay | Admitting: *Deleted

## 2015-07-24 NOTE — Telephone Encounter (Signed)
Pt signed ROI received, forwarded to [place] to scan/email to medical records. JG//CMA

## 2015-08-02 ENCOUNTER — Emergency Department (HOSPITAL_COMMUNITY)
Admission: EM | Admit: 2015-08-02 | Discharge: 2015-08-02 | Disposition: A | Discharge status: Home / Self Care | Payer: BLUE CROSS/BLUE SHIELD | Attending: Emergency Medicine | Admitting: Emergency Medicine

## 2015-08-02 ENCOUNTER — Emergency Department (HOSPITAL_COMMUNITY): Payer: BLUE CROSS/BLUE SHIELD

## 2015-08-02 ENCOUNTER — Encounter (HOSPITAL_COMMUNITY): Payer: Self-pay | Admitting: *Deleted

## 2015-08-02 DIAGNOSIS — Z7951 Long term (current) use of inhaled steroids: Secondary | ICD-10-CM | POA: Diagnosis not present

## 2015-08-02 DIAGNOSIS — Z9889 Other specified postprocedural states: Secondary | ICD-10-CM | POA: Diagnosis not present

## 2015-08-02 DIAGNOSIS — M797 Fibromyalgia: Secondary | ICD-10-CM | POA: Diagnosis not present

## 2015-08-02 DIAGNOSIS — R063 Periodic breathing: Secondary | ICD-10-CM | POA: Diagnosis not present

## 2015-08-02 DIAGNOSIS — F329 Major depressive disorder, single episode, unspecified: Secondary | ICD-10-CM | POA: Insufficient documentation

## 2015-08-02 DIAGNOSIS — K2100 Gastro-esophageal reflux disease with esophagitis, without bleeding: Secondary | ICD-10-CM

## 2015-08-02 DIAGNOSIS — G40909 Epilepsy, unspecified, not intractable, without status epilepticus: Secondary | ICD-10-CM | POA: Insufficient documentation

## 2015-08-02 DIAGNOSIS — G2581 Restless legs syndrome: Secondary | ICD-10-CM | POA: Insufficient documentation

## 2015-08-02 DIAGNOSIS — Z87891 Personal history of nicotine dependence: Secondary | ICD-10-CM | POA: Insufficient documentation

## 2015-08-02 DIAGNOSIS — E785 Hyperlipidemia, unspecified: Secondary | ICD-10-CM | POA: Insufficient documentation

## 2015-08-02 DIAGNOSIS — Z79899 Other long term (current) drug therapy: Secondary | ICD-10-CM | POA: Diagnosis not present

## 2015-08-02 DIAGNOSIS — R079 Chest pain, unspecified: Secondary | ICD-10-CM | POA: Diagnosis present

## 2015-08-02 LAB — CBC WITH DIFFERENTIAL/PLATELET
Basophils Absolute: 0.1 10*3/uL (ref 0.0–0.1)
Basophils Relative: 1 %
EOS PCT: 1 %
Eosinophils Absolute: 0.1 10*3/uL (ref 0.0–0.7)
HCT: 41.2 % (ref 36.0–46.0)
Hemoglobin: 13.7 g/dL (ref 12.0–15.0)
LYMPHS ABS: 2.7 10*3/uL (ref 0.7–4.0)
LYMPHS PCT: 34 %
MCH: 30.4 pg (ref 26.0–34.0)
MCHC: 33.3 g/dL (ref 30.0–36.0)
MCV: 91.4 fL (ref 78.0–100.0)
MONO ABS: 0.4 10*3/uL (ref 0.1–1.0)
MONOS PCT: 5 %
Neutro Abs: 4.8 10*3/uL (ref 1.7–7.7)
Neutrophils Relative %: 59 %
PLATELETS: 272 10*3/uL (ref 150–400)
RBC: 4.51 MIL/uL (ref 3.87–5.11)
RDW: 13.2 % (ref 11.5–15.5)
WBC: 8.1 10*3/uL (ref 4.0–10.5)

## 2015-08-02 LAB — BASIC METABOLIC PANEL
Anion gap: 12 (ref 5–15)
BUN: 15 mg/dL (ref 6–20)
CALCIUM: 9.4 mg/dL (ref 8.9–10.3)
CO2: 22 mmol/L (ref 22–32)
CREATININE: 0.64 mg/dL (ref 0.44–1.00)
Chloride: 108 mmol/L (ref 101–111)
GFR calc Af Amer: >60 mL/min (ref >60)
GFR calc non Af Amer: >60 mL/min (ref >60)
GLUCOSE: 104 mg/dL — AB (ref 65–99)
Potassium: 4.1 mmol/L (ref 3.5–5.1)
Sodium: 142 mmol/L (ref 135–145)

## 2015-08-02 LAB — I-STAT TROPONIN, ED: Troponin i, poc: 0 ng/mL (ref 0.00–0.08)

## 2015-08-02 MED ORDER — PANTOPRAZOLE SODIUM 40 MG PO TBEC: 40.0000 mg | DELAYED_RELEASE_TABLET | Freq: Every day | ORAL | Status: DC

## 2015-08-02 MED ORDER — GI COCKTAIL ~~LOC~~
30.0000 mL | Freq: Once | ORAL | Status: AC
  Administered 2015-08-02: 30 mL via ORAL

## 2015-08-02 MED ORDER — ONDANSETRON 8 MG PO TBDP
8.0000 mg | ORAL_TABLET | Freq: Once | ORAL | Status: AC
  Administered 2015-08-02: 8 mg via ORAL
  Filled 2015-08-02: qty 1

## 2015-08-02 MED ORDER — ASPIRIN 81 MG PO CHEW
324.0000 mg | CHEWABLE_TABLET | Freq: Once | ORAL | Status: AC
  Administered 2015-08-02: 324 mg via ORAL
  Filled 2015-08-02: qty 4

## 2015-08-02 MED ORDER — PANTOPRAZOLE SODIUM 40 MG PO TBEC
40.0000 mg | DELAYED_RELEASE_TABLET | Freq: Once | ORAL | Status: AC
Start: 2015-08-02 — End: 2015-08-02
  Administered 2015-08-02: 40 mg via ORAL
  Filled 2015-08-02: qty 1

## 2015-08-02 MED ORDER — GI COCKTAIL ~~LOC~~
30.0000 mL | Freq: Once | ORAL | Status: DC
  Filled 2015-08-02: qty 30

## 2015-08-02 NOTE — Discharge Instructions (Signed)
Gastroesophageal Reflux Disease, Adult Normally, food travels down the esophagus and stays in the stomach to be digested. However, when a person has gastroesophageal reflux disease (GERD), food and stomach acid move back up into the esophagus. When this happens, the esophagus becomes sore and inflamed. Over time, GERD can create small holes (ulcers) in the lining of the esophagus.  CAUSES This condition is caused by a problem with the muscle between the esophagus and the stomach (lower esophageal sphincter, or LES). Normally, the LES muscle closes after food passes through the esophagus to the stomach. When the LES is weakened or abnormal, it does not close properly, and that allows food and stomach acid to go back up into the esophagus. The LES can be weakened by certain dietary substances, medicines, and medical conditions, including:  Tobacco use.  Pregnancy.  Having a hiatal hernia.  Heavy alcohol use.  Certain foods and beverages, such as coffee, chocolate, onions, and peppermint. RISK FACTORS This condition is more likely to develop in:  People who have an increased body weight.  People who have connective tissue disorders.  People who use NSAID medicines. SYMPTOMS Symptoms of this condition include:  Heartburn.  Difficult or painful swallowing.  The feeling of having a lump in the throat.  Abitter taste in the mouth.  Bad breath.  Having a large amount of saliva.  Having an upset or bloated stomach.  Belching.  Chest pain.  Shortness of breath or wheezing.  Ongoing (chronic) cough or a night-time cough.  Wearing away of tooth enamel.  Weight loss. Different conditions can cause chest pain. Make sure to see your health care provider if you experience chest pain. DIAGNOSIS Your health care provider will take a medical history and perform a physical exam. To determine if you have mild or severe GERD, your health care provider may also monitor how you respond  to treatment. You may also have other tests, including:  An endoscopy toexamine your stomach and esophagus with a small camera.  A test thatmeasures the acidity level in your esophagus.  A test thatmeasures how much pressure is on your esophagus.  A barium swallow or modified barium swallow to show the shape, size, and functioning of your esophagus. TREATMENT The goal of treatment is to help relieve your symptoms and to prevent complications. Treatment for this condition may vary depending on how severe your symptoms are. Your health care provider may recommend:  Changes to your diet.  Medicine.  Surgery. HOME CARE INSTRUCTIONS Diet  Follow a diet as recommended by your health care provider. This may involve avoiding foods and drinks such as:  Coffee and tea (with or without caffeine).  Drinks that containalcohol.  Energy drinks and sports drinks.  Carbonated drinks or sodas.  Chocolate and cocoa.  Peppermint and mint flavorings.  Garlic and onions.  Horseradish.  Spicy and acidic foods, including peppers, chili powder, curry powder, vinegar, hot sauces, and barbecue sauce.  Citrus fruit juices and citrus fruits, such as oranges, lemons, and limes.  Tomato-based foods, such as red sauce, chili, salsa, and pizza with red sauce.  Fried and fatty foods, such as donuts, french fries, potato chips, and high-fat dressings.  High-fat meats, such as hot dogs and fatty cuts of red and white meats, such as rib eye steak, sausage, ham, and bacon.  High-fat dairy items, such as whole milk, butter, and cream cheese.  Eat small, frequent meals instead of large meals.  Avoid drinking large amounts of liquid with your   meals.  Avoid eating meals during the 2-3 hours before bedtime.  Avoid lying down right after you eat.  Do not exercise right after you eat. General Instructions  Pay attention to any changes in your symptoms.  Take over-the-counter and prescription  medicines only as told by your health care provider. Do not take aspirin, ibuprofen, or other NSAIDs unless your health care provider told you to do so.  Do not use any tobacco products, including cigarettes, chewing tobacco, and e-cigarettes. If you need help quitting, ask your health care provider.  Wear loose-fitting clothing. Do not wear anything tight around your waist that causes pressure on your abdomen.  Raise (elevate) the head of your bed 6 inches (15cm).  Try to reduce your stress, such as with yoga or meditation. If you need help reducing stress, ask your health care provider.  If you are overweight, reduce your weight to an amount that is healthy for you. Ask your health care provider for guidance about a safe weight loss goal.  Keep all follow-up visits as told by your health care provider. This is important. SEEK MEDICAL CARE IF:  You have new symptoms.  You have unexplained weight loss.  You have difficulty swallowing, or it hurts to swallow.  You have wheezing or a persistent cough.  Your symptoms do not improve with treatment.  You have a hoarse voice. SEEK IMMEDIATE MEDICAL CARE IF:  You have pain in your arms, neck, jaw, teeth, or back.  You feel sweaty, dizzy, or light-headed.  You have chest pain or shortness of breath.  You vomit and your vomit looks like blood or coffee grounds.  You faint.  Your stool is bloody or black.  You cannot swallow, drink, or eat.   This information is not intended to replace advice given to you by your health care provider. Make sure you discuss any questions you have with your health care provider.   Document Released: 04/15/2005 Document Revised: 03/27/2015 Document Reviewed: 10/31/2014 Elsevier Interactive Patient Education 2016 [employer]  Pantoprazole tablets What is this medicine? PANTOPRAZOLE (pan TOE pra zole) prevents the production of acid in the stomach. It is used to treat gastroesophageal reflux  disease (GERD), inflammation of the esophagus, and Zollinger-Ellison syndrome. This medicine may be used for other purposes; ask your health care provider or pharmacist if you have questions. What should I tell my health care provider before I take this medicine? They need to know if you have any of these conditions: -liver disease -low levels of magnesium in the blood -an unusual or allergic reaction to omeprazole, lansoprazole, pantoprazole, rabeprazole, other medicines, foods, dyes, or preservatives -pregnant or trying to get pregnant -breast-feeding How should I use this medicine? Take this medicine by mouth. Swallow the tablets whole with a drink of water. Follow the directions on the prescription label. Do not crush, break, or chew. Take your medicine at regular intervals. Do not take your medicine more often than directed. Talk to your pediatrician regarding the use of this medicine in children. While this drug may be prescribed for children as young as 5 years for selected conditions, precautions do apply. Overdosage: If you think you have taken too much of this medicine contact a poison control center or emergency room at once. NOTE: This medicine is only for you. Do not share this medicine with others. What if I miss a dose? If you miss a dose, take it as soon as you can. If it is almost time for your   next dose, take only that dose. Do not take double or extra doses. What may interact with this medicine? Do not take this medicine with any of the following medications: -atazanavir -nelfinavir This medicine may also interact with the following medications: -ampicillin -delavirdine -erlotinib -iron salts -medicines for fungal infections like ketoconazole, itraconazole and voriconazole -methotrexate -mycophenolate mofetil -warfarin This list may not describe all possible interactions. Give your health care provider a list of all the medicines, herbs, non-prescription drugs, or  dietary supplements you use. Also tell them if you smoke, drink alcohol, or use illegal drugs. Some items may interact with your medicine. What should I watch for while using this medicine? It can take several days before your stomach pain gets better. Check with your doctor or health care professional if your condition does not start to get better, or if it gets worse. You may need blood work done while you are taking this medicine. What side effects may I notice from receiving this medicine? Side effects that you should report to your doctor or health care professional as soon as possible: -allergic reactions like skin rash, itching or hives, swelling of the face, lips, or tongue -bone, muscle or joint pain -breathing problems -chest pain or chest tightness -dark yellow or brown urine -dizziness -fast, irregular heartbeat -feeling faint or lightheaded -fever or sore throat -muscle spasm -palpitations -redness, blistering, peeling or loosening of the skin, including inside the mouth -seizures -tremors -unusual bleeding or bruising -unusually weak or tired -yellowing of the eyes or skin Side effects that usually do not require medical attention (Report these to your doctor or health care professional if they continue or are bothersome.): -constipation -diarrhea -dry mouth -headache -nausea This list may not describe all possible side effects. Call your doctor for medical advice about side effects. You may report side effects to FDA at 1-800-FDA-1088. Where should I keep my medicine? Keep out of the reach of children. Store at room temperature between 15 and 30 degrees C (59 and 86 degrees F). Protect from light and moisture. Throw away any unused medicine after the expiration date. NOTE: This sheet is a summary. It may not cover all possible information. If you have questions about this medicine, talk to your doctor, pharmacist, or health care provider.    2016, Elsevier/Gold  Standard. (2014-08-24 14:45:56)  

## 2015-08-02 NOTE — ED Notes (Signed)
Pt states that she has has midsternal chest pain for 2-3 hrs; pt states that it woke her from sleep; pt states that the pain is a burning and a pressure; pt c/o nausea and dizziness; pt states "my eyes feel like they are jumping"

## 2015-08-02 NOTE — ED Provider Notes (Signed)
CSN: [id number]     Arrival date & time 08/02/15  0315 History   First MD Initiated Contact with Patient 08/02/15 [phone]     Chief Complaint  Patient presents with  . Chest Pain     (Consider location/radiation/quality/duration/timing/severity/associated sxs/prior Treatment) Patient is a 65 y.o. female presenting with chest pain. The history is provided by the patient.  Chest Pain She was awakened at about 1 AM with a burning pain in the central chest with some radiation of the shoulders into her throat. She rated pain at 8/10 at its worst but it is down to 4/10. There is associated nausea but no dyspnea or diaphoresis. She does have history of panic attacks. She currently is resting comfortably but she told triage nurse that her eyes felt like they were jumping. Of note, she had "a short, stiff" Bourbon before going to bed. No other unusual ingestion prior to going to bed. Eyes nothing seems to make her pain better nothing seems to make it worse. Cardiac risk factors are hyperlipidemia. She denies history of hypertension, diabetes, tobacco use, family history of premature coronary atherosclerosis. She has not tried any treatments at home.  Past Medical History  Diagnosis Date  . GERD (gastroesophageal reflux disease)   . Allergy   . Thyroid disease   . Osteopenia   . Seizure disorder [facility])     she is on life long medication per Neuro and can not have driving privileges if she  stops meds  . Hyperlipidemia   . Fibromyalgia   . IBS (irritable bowel syndrome)   . Alcohol abuse   . RLS (restless legs syndrome)   . Depression   . Hashimoto's thyroiditis   . Seizures [facility])    Past Surgical History  Procedure Laterality Date  . Cesarean section  1191,4782    x's 2  . Wrist surgery      Right wrist---Benign mass  . Foot surgery      Left--Neuroma, Bunion  . Liposuction      abdominal  . Tonsillectomy      as a child  . Bunionectomy      Right and Hammertoe   Family History   Problem Relation Age of Onset  . Breast cancer Mother   . Hyperlipidemia Mother   . Prostate cancer Father   . Hyperlipidemia Father   . Depression Father   . Cancer Cousin     Colon Cancer  . Diabetes Other     Maternal Grandparent  . Kidney disease Other     Maternal Grandparent  . Stroke Other     Grandparent  . Heart disease Other     Grand Parent   Social History  Substance Use Topics  . Smoking status: Former Smoker    Quit date: 07/21/1979  . Smokeless tobacco: Never Used  . Alcohol Use: Yes     Comment: 2 drinks per night   OB History    No data available     Review of Systems  Cardiovascular: Positive for chest pain.  All other systems reviewed and are negative.     Allergies  Morphine  Home Medications   Prior to Admission medications   Medication Sig Start Date End Date Taking? Authorizing Provider  ALPRAZolam Prudy Feeler) 0.5 MG tablet Take 1 tablet (0.5 mg total) by mouth 2 (two) times daily as needed for anxiety. 05/15/15  Yes Elwin Mocha, MD  desloratadine (CLARINEX) 5 MG tablet Take 1 tablet by mouth daily. 05/05/14  Yes  Historical Provider, MD  FLUoxetine (PROZAC) 20 MG capsule Take 40 mg by mouth daily.  04/27/15  Yes Historical Provider, MD  fluticasone (FLONASE) 50 MCG/ACT nasal spray USE 2 SPRAYS IN EACH NOSTRIL DAILY 01/31/13  Yes Sheliah HatchKatherine E Tabori, MD  gabapentin (NEURONTIN) 600 MG tablet Take 1,200 mg by mouth at bedtime.    Yes Historical Provider, MD  hydrOXYzine (VISTARIL) 25 MG capsule Take 25 mg by mouth as needed for anxiety.  05/18/14  Yes Historical Provider, MD  ibuprofen (ADVIL,MOTRIN) 200 MG tablet Take 400 mg by mouth every 6 (six) hours as needed for moderate pain.   Yes Historical Provider, MD  lamoTRIgine (LAMICTAL) 200 MG tablet Take 200 mg by mouth daily.  10/30/14  Yes Historical Provider, MD  levothyroxine (SYNTHROID, LEVOTHROID) 112 MCG tablet TAKE 1 TABLET(112 MCG) BY MOUTH DAILY 03/14/15  Yes Sheliah HatchKatherine E Tabori, MD   pantoprazole (PROTONIX) 40 MG tablet TAKE 1 TABLET BY MOUTH EVERY DAY 03/05/15  Yes Sheliah HatchKatherine E Tabori, MD  PATANASE 0.6 % SOLN Inhale 2 each into the lungs daily as needed (congestion).  04/28/13  Yes Historical Provider, MD  polyethylene glycol (MIRALAX / GLYCOLAX) packet Take 17 g by mouth daily as needed for moderate constipation.   Yes Historical Provider, MD  pramipexole (MIRAPEX) 1 MG tablet Take 3 mg by mouth at bedtime. 4 times daily as needed.   Yes Historical Provider, MD  simvastatin (ZOCOR) 20 MG tablet TAKE 1 TABLET BY MOUTH EVERY NIGHT AT BEDTIME 01/22/15  Yes Sheliah HatchKatherine E Tabori, MD  ALPRAZolam Prudy Feeler(XANAX) 0.5 MG tablet Take 1 tablet (0.5 mg total) by mouth 2 (two) times daily as needed for anxiety. Patient not taking: Reported on 08/02/2015 11/19/14   Sheliah HatchKatherine E Tabori, MD   BP 139/74 mmHg  Pulse 76  Temp(Src) 98.2 F (36.8 C) (Oral)  Resp 17  Ht 5\' 4"  (1.626 m)  Wt 150 lb (68.04 kg)  BMI 25.73 kg/m2  SpO2 95% Physical Exam  Nursing note and vitals reviewed.  65 year old female, resting comfortably and in no acute distress. Vital signs are normal. Oxygen saturation is 95%, which is normal. Head is normocephalic and atraumatic. PERRLA, EOMI. Oropharynx is clear. Neck is nontender and supple without adenopathy or JVD. Back is nontender and there is no CVA tenderness. Lungs are clear without rales, wheezes, or rhonchi. Chest is nontender. Heart has regular rate and rhythm without murmur. Abdomen is soft, flat, with moderate epigastric tenderness. There are no masses or hepatosplenomegaly and peristalsis is normoactive. Extremities have no cyanosis or edema, full range of motion is present. Skin is warm and dry without rash. Neurologic: Mental status is normal, cranial nerves are intact, there are no motor or sensory deficits.  ED Course  Procedures (including critical care time) Labs Review Results for orders placed or performed during the hospital encounter of 08/02/15   Basic metabolic panel  Result Value Ref Range   Sodium 142 135 - 145 mmol/L   Potassium 4.1 3.5 - 5.1 mmol/L   Chloride 108 101 - 111 mmol/L   CO2 22 22 - 32 mmol/L   Glucose, Bld 104 (H) 65 - 99 mg/dL   BUN 15 6 - 20 mg/dL   Creatinine, Ser 1.610.64 0.44 - 1.00 mg/dL   Calcium 9.4 8.9 - 09.610.3 mg/dL   GFR calc non Af Amer >60 >60 mL/min   GFR calc Af Amer >60 >60 mL/min   Anion gap 12 5 - 15  CBC with Differential  Result  Value Ref Range   WBC 8.1 4.0 - 10.5 K/uL   RBC 4.51 3.87 - 5.11 MIL/uL   Hemoglobin 13.7 12.0 - 15.0 g/dL   HCT 91.4 78.2 - 95.6 %   MCV 91.4 78.0 - 100.0 fL   MCH 30.4 26.0 - 34.0 pg   MCHC 33.3 30.0 - 36.0 g/dL   RDW 21.3 08.6 - 57.8 %   Platelets 272 150 - 400 K/uL   Neutrophils Relative % 59 %   Neutro Abs 4.8 1.7 - 7.7 K/uL   Lymphocytes Relative 34 %   Lymphs Abs 2.7 0.7 - 4.0 K/uL   Monocytes Relative 5 %   Monocytes Absolute 0.4 0.1 - 1.0 K/uL   Eosinophils Relative 1 %   Eosinophils Absolute 0.1 0.0 - 0.7 K/uL   Basophils Relative 1 %   Basophils Absolute 0.1 0.0 - 0.1 K/uL  I-stat troponin, ED (not at [facility], [facility])  Result Value Ref Range   Troponin i, poc 0.00 0.00 - 0.08 ng/mL   Comment 3           Imaging Review Dg Chest 2 View  08/02/2015  CLINICAL DATA:  Initial valuation for sudden onset midsternal chest pain. EXAM: CHEST  2 VIEW COMPARISON:  Prior study from 07/14/2013. FINDINGS: The cardiac and mediastinal silhouettes are stable in size and contour, and remain within normal limits. The lungs are normally inflated. Linear left basilar opacities consistent with atelectasis. No airspace consolidation, pleural effusion, or pulmonary edema is identified. There is no pneumothorax. No acute osseous abnormality identified. IMPRESSION: Mild left basilar atelectasis. No other active cardiopulmonary disease. Electronically Signed   By: Rise Mu M.D.   On: 08/02/2015 04:06   I have personally reviewed and evaluated these images and lab results  as part of my medical decision-making.   EKG Interpretation   Date/Time:  Friday August 02 2015 03:28:52 EST Ventricular Rate:  75 PR Interval:  162 QRS Duration: 85 QT Interval:  425 QTC Calculation: 475 R Axis:   74 Text Interpretation:  Sinus rhythm Baseline wander in lead(s) V1 When  compared with ECG of 07/16/2007, No significant change was found Confirmed  by [facility]  MD, Iara Monds ([id number]) on 08/02/2015 3:37:08 AM      MDM   Final diagnoses:  Gastroesophageal reflux disease with esophagitis    Chest pain and pattern that is very suspicious for gastroesophageal reflux. She does show epigastric tenderness and did have excess alcohol prior to going to bed. Old records are reviewed and she has no relevant past records. ECG is normal and chest x-ray is unremarkable. Laboratory workup is pending. She will be given ondansetron for nausea, aspirin, and a GI cocktail.  She had excellent relief of discomfort with GI cocktail. Further review of old records shows that she had an esophageal swallow study in 2015 which showed evidence of GERD and delayed passage of barium into the stomach. This is also consistent with the patient's presentation today. She is discharged with prescription for pantoprazole and is referred back to her gastroenterologist for further outpatient management and workup.  Dione Booze, MD 08/02/15 [phone]

## 2015-08-02 NOTE — ED Notes (Signed)
Pt ambulating independently w/ steady gait on d/c in no acute distress, A&Ox4. D/c instructions reviewed w/ pt and family - pt and family deny any further questions or concerns at present. Rx given x1  

## 2015-08-19 ENCOUNTER — Telehealth: Payer: Self-pay | Admitting: *Deleted

## 2015-08-19 ENCOUNTER — Other Ambulatory Visit: Payer: Self-pay | Admitting: Family Medicine

## 2015-08-19 NOTE — Telephone Encounter (Signed)
LM to pt to call and schedule fasting cpe

## 2015-08-19 NOTE — Telephone Encounter (Signed)
Pt is overdue for f/u. Please call pt to schedule fasting CPE.

## 2015-08-19 NOTE — Telephone Encounter (Signed)
Medication filled to pharmacy as requested.   

## 2015-09-13 LAB — HM MAMMOGRAPHY

## 2015-09-17 ENCOUNTER — Encounter: Payer: Self-pay | Admitting: Family Medicine

## 2015-09-20 ENCOUNTER — Other Ambulatory Visit: Payer: Self-pay | Admitting: Family Medicine

## 2015-09-23 ENCOUNTER — Encounter: Payer: Self-pay | Admitting: Family Medicine

## 2015-10-05 ENCOUNTER — Other Ambulatory Visit: Payer: Self-pay | Admitting: Family Medicine

## 2015-10-05 NOTE — Telephone Encounter (Signed)
Medication filled to pharmacy as requested.   

## 2015-10-18 ENCOUNTER — Other Ambulatory Visit: Payer: Self-pay | Admitting: Family Medicine

## 2015-11-03 ENCOUNTER — Other Ambulatory Visit: Payer: Self-pay | Admitting: Family Medicine

## 2015-11-15 ENCOUNTER — Other Ambulatory Visit: Payer: Self-pay | Admitting: Family Medicine

## 2015-11-15 NOTE — Telephone Encounter (Signed)
Med denied, pt has a new pcp.  

## 2016-02-18 ENCOUNTER — Emergency Department (HOSPITAL_COMMUNITY)
Admission: EM | Admit: 2016-02-18 | Discharge: 2016-02-18 | Disposition: A | Discharge status: Home / Self Care | Payer: BLUE CROSS/BLUE SHIELD | Attending: Emergency Medicine | Admitting: Emergency Medicine

## 2016-02-18 ENCOUNTER — Encounter (HOSPITAL_COMMUNITY): Payer: Self-pay | Admitting: Emergency Medicine

## 2016-02-18 ENCOUNTER — Emergency Department (HOSPITAL_COMMUNITY): Payer: BLUE CROSS/BLUE SHIELD

## 2016-02-18 DIAGNOSIS — R630 Anorexia: Secondary | ICD-10-CM

## 2016-02-18 DIAGNOSIS — Z87891 Personal history of nicotine dependence: Secondary | ICD-10-CM | POA: Insufficient documentation

## 2016-02-18 DIAGNOSIS — R634 Abnormal weight loss: Secondary | ICD-10-CM | POA: Diagnosis present

## 2016-02-18 DIAGNOSIS — R251 Tremor, unspecified: Secondary | ICD-10-CM | POA: Insufficient documentation

## 2016-02-18 DIAGNOSIS — Z79899 Other long term (current) drug therapy: Secondary | ICD-10-CM | POA: Insufficient documentation

## 2016-02-18 LAB — CBC WITH DIFFERENTIAL/PLATELET
BASOS ABS: 0 10*3/uL (ref 0.0–0.1)
BASOS PCT: 0 %
EOS PCT: 0 %
Eosinophils Absolute: 0 10*3/uL (ref 0.0–0.7)
HEMATOCRIT: 44 % (ref 36.0–46.0)
Hemoglobin: 15.1 g/dL — ABNORMAL HIGH (ref 12.0–15.0)
Lymphocytes Relative: 29 %
Lymphs Abs: 3.1 10*3/uL (ref 0.7–4.0)
MCH: 30.7 pg (ref 26.0–34.0)
MCHC: 34.3 g/dL (ref 30.0–36.0)
MCV: 89.4 fL (ref 78.0–100.0)
MONO ABS: 0.4 10*3/uL (ref 0.1–1.0)
MONOS PCT: 4 %
NEUTROS ABS: 7.1 10*3/uL (ref 1.7–7.7)
Neutrophils Relative %: 67 %
PLATELETS: 247 10*3/uL (ref 150–400)
RBC: 4.92 MIL/uL (ref 3.87–5.11)
RDW: 13.3 % (ref 11.5–15.5)
WBC: 10.6 10*3/uL — ABNORMAL HIGH (ref 4.0–10.5)

## 2016-02-18 LAB — COMPREHENSIVE METABOLIC PANEL
ALBUMIN: 5.1 g/dL — AB (ref 3.5–5.0)
ALT: 21 U/L (ref 14–54)
ANION GAP: 9 (ref 5–15)
AST: 19 U/L (ref 15–41)
Alkaline Phosphatase: 84 U/L (ref 38–126)
BILIRUBIN TOTAL: 1 mg/dL (ref 0.3–1.2)
BUN: 17 mg/dL (ref 6–20)
CHLORIDE: 109 mmol/L (ref 101–111)
CO2: 24 mmol/L (ref 22–32)
Calcium: 9.7 mg/dL (ref 8.9–10.3)
Creatinine, Ser: 0.59 mg/dL (ref 0.44–1.00)
GFR calc Af Amer: >60 mL/min (ref >60)
Glucose, Bld: 96 mg/dL (ref 65–99)
POTASSIUM: 3.3 mmol/L — AB (ref 3.5–5.1)
Sodium: 142 mmol/L (ref 135–145)
TOTAL PROTEIN: 7.5 g/dL (ref 6.5–8.1)

## 2016-02-18 LAB — ETHANOL

## 2016-02-18 LAB — LIPASE, BLOOD: LIPASE: 26 U/L (ref 11–51)

## 2016-02-18 MED ORDER — SODIUM CHLORIDE 0.9 % IV SOLN
1000.0000 mL | Freq: Once | INTRAVENOUS | Status: AC
  Administered 2016-02-18: 1000 mL via INTRAVENOUS

## 2016-02-18 MED ORDER — SODIUM CHLORIDE 0.9 % IV SOLN
1000.0000 mL | INTRAVENOUS | Status: DC
  Administered 2016-02-18: 1000 mL via INTRAVENOUS

## 2016-02-18 MED ORDER — DIATRIZOATE MEGLUMINE & SODIUM 66-10 % PO SOLN
15.0000 mL | Freq: Once | ORAL | Status: AC
  Administered 2016-02-18: 15 mL via ORAL

## 2016-02-18 MED ORDER — IOPAMIDOL (ISOVUE-300) INJECTION 61%
100.0000 mL | Freq: Once | INTRAVENOUS | Status: AC | PRN
  Administered 2016-02-18: 100 mL via INTRAVENOUS

## 2016-02-18 NOTE — ED Notes (Signed)
Pt ambulated to CT along side CT Tech.

## 2016-02-18 NOTE — ED Provider Notes (Signed)
[facility]-EMERGENCY DEPT Provider Note   CSN: [id number] Arrival date & time: 02/18/16  0488  First Provider Contact:  First MD Initiated Contact with Patient 02/18/16 [phone]        History   Chief Complaint Chief Complaint  Patient presents with  . Tremors  . Leg Pain  . Knee Pain    HPI Megan Singleton is a 65 y.o. female.  Patient reports 2-3 months of increasing anorexia and weight loss.  She presents today because of several days of bilateral leg and joint pain as well as some generalized tremors of both her upper and lower extremities.  Patient denies abdominal pain.  She reports no nausea vomiting or diarrhea.  She reports just simply no appetite.  No history of cancer.  She has had intermittent headaches over the past several months.  She believes much this may be due to depression and panic attacks and she has been working with her therapist but no improvements of the made.  Finally her husband made her come to the emergency department for further evaluation.  She does report that she has 2-3 large bourbon drinks every day which she reports is probably more than she should have.  She reports that she never allows herself to have a drink before 3:00 on weekdays and slightly earlier on weekends.  She is a retired Scientist, forensic.  She also reports occasionally when she swallows she does feel like food becomes lodged before passes again.  She has not seen GI for endoscopy.  She does report a normal colonoscopy except for mild diverticulosis less than 3 years ago.  She reports annual mammograms including recently   The history is provided by the patient and medical records.    Past Medical History:  Diagnosis Date  . Alcohol abuse   . Allergy   . Depression   . Fibromyalgia   . GERD (gastroesophageal reflux disease)   . Hashimoto's thyroiditis   . Hyperlipidemia   . IBS (irritable bowel syndrome)   . Osteopenia   . RLS (restless legs syndrome)   . Seizure disorder [facility])    she is  on life long medication per Neuro and can not have driving privileges if she  stops meds  . Seizures (HCC)   . Thyroid disease     Patient Active Problem List   Diagnosis Date Noted  . Panic attacks 11/19/2014  . Pap smear for cervical cancer screening 05/21/2014  . Cough 09/27/2013  . Thoracic back pain 08/18/2011  . Conjunctivitis 07/16/2011  . General medical examination 01/06/2011  . DYSPAREUNIA 02/10/2010  . VAGINITIS, ATROPHIC 02/10/2010  . PILONIDAL CYST 02/10/2010  . PNEUMONIA 01/16/2010  . INCONTINENCE, MIXED, URGE/STRESS 01/16/2010  . CHEST PAIN UNSPECIFIED 10/04/2009  . MALAISE AND FATIGUE 09/10/2009  . DIARRHEA, CHRONIC 09/10/2009  . VISUAL CHANGES 06/10/2009  . GANGLION CYST, WRIST, LEFT 06/10/2009  . POSTMENOPAUSAL STATUS 06/10/2009  . PLANTAR FASCIITIS 12/13/2008  . RESTLESS LEGS SYNDROME 09/05/2008  . Impacted cerumen 07/23/2008  . DEPRESSION 07/04/2008  . OTITIS EXTERNA 07/04/2008  . VIRAL URI 07/04/2008  . SWEATING 08/11/2007  . Hyperlipidemia 08/02/2007  . ALCOHOL ABUSE 08/02/2007  . SINUSITIS- ACUTE-NOS 05/11/2007  . ALLERGIC RHINITIS 05/11/2007  . PAIN IN JOINT, HAND 03/09/2007  . Hypothyroidism 12/15/2006  . HEMORRHOIDS 12/15/2006  . GERD 12/15/2006  . MICROSCOPIC HEMATURIA 12/15/2006  . OSTEOPENIA 12/15/2006  . Seizure disorder (HCC) 12/15/2006    Past Surgical History:  Procedure Laterality Date  . BUNIONECTOMY  Right and Hammertoe  . CESAREAN SECTION  1610,9604   x's 2  . FOOT SURGERY     Left--Neuroma, Bunion  . LIPOSUCTION     abdominal  . TONSILLECTOMY     as a child  . WRIST SURGERY     Right wrist---Benign mass    OB History    No data available       Home Medications    Prior to Admission medications   Medication Sig Start Date End Date Taking? Authorizing Provider  desloratadine (CLARINEX) 5 MG tablet Take 1 tablet by mouth daily. 05/05/14  Yes Historical Provider, MD  FLUoxetine (PROZAC) 40 MG capsule Take 40  mg by mouth every morning.   Yes Historical Provider, MD  fluticasone (FLONASE) 50 MCG/ACT nasal spray USE 2 SPRAYS IN EACH NOSTRIL DAILY 01/31/13  Yes Sheliah Hatch, MD  hydrOXYzine (VISTARIL) 25 MG capsule Take 25 mg by mouth every 8 (eight) hours as needed for anxiety.  05/18/14  Yes Historical Provider, MD  ibuprofen (ADVIL,MOTRIN) 200 MG tablet Take 400 mg by mouth every 6 (six) hours as needed for moderate pain.   Yes Historical Provider, MD  Ibuprofen-Diphenhydramine Cit (ADVIL PM PO) Take 1 tablet by mouth every 6 (six) hours as needed (as needed for pain).   Yes Historical Provider, MD  lamoTRIgine (LAMICTAL) 200 MG tablet Take 200 mg by mouth daily.  10/30/14  Yes Historical Provider, MD  levothyroxine (SYNTHROID, LEVOTHROID) 112 MCG tablet TAKE 1 TABLET BY MOUTH EVERY DAY 10/18/15  Yes Sheliah Hatch, MD  Olopatadine HCl 0.6 % SOLN Place 2 drops into the nose daily as needed (for allergies and congestin).   Yes Historical Provider, MD  pantoprazole (PROTONIX) 40 MG tablet TAKE 1 TABLET BY MOUTH EVERY DAY 10/05/15  Yes Sheliah Hatch, MD  polyethylene glycol (MIRALAX / GLYCOLAX) packet Take 17 g by mouth daily as needed for moderate constipation.   Yes Historical Provider, MD  pramipexole (MIRAPEX) 1 MG tablet Take 3 mg by mouth at bedtime.    Yes Historical Provider, MD  simvastatin (ZOCOR) 20 MG tablet TAKE 1 TABLET BY MOUTH EVERY NIGHT AT BEDTIME 10/18/15  Yes Sheliah Hatch, MD    Family History Family History  Problem Relation Age of Onset  . Breast cancer Mother   . Hyperlipidemia Mother   . Prostate cancer Father   . Hyperlipidemia Father   . Depression Father   . Cancer Cousin     Colon Cancer  . Diabetes Other     Maternal Grandparent  . Kidney disease Other     Maternal Grandparent  . Stroke Other     Grandparent  . Heart disease Other     Grand Parent    Social History Social History  Substance Use Topics  . Smoking status: Former Smoker    Quit  date: 07/21/1979  . Smokeless tobacco: Never Used  . Alcohol use Yes     Comment: 1-2 drinks per night     Allergies   Morphine   Review of Systems Review of Systems  All other systems reviewed and are negative.    Physical Exam Updated Vital Signs BP 134/66 (BP Location: Left Arm)   Pulse 61   Temp 98.4 F (36.9 C) (Oral)   Resp 16   Ht 5\' 4"  (1.626 m)   Wt 133 lb (60.3 kg)   SpO2 100%   BMI 22.83 kg/m   Physical Exam  Constitutional: She is oriented to  person, place, and time. She appears well-developed and well-nourished. No distress.  HENT:  Head: Normocephalic and atraumatic.  Eyes: EOM are normal.  Neck: Normal range of motion.  Cardiovascular: Normal rate, regular rhythm and normal heart sounds.   Pulmonary/Chest: Effort normal and breath sounds normal.  Abdominal: Soft. She exhibits no distension. There is no tenderness.  Musculoskeletal: Normal range of motion.  Neurological: She is alert and oriented to person, place, and time.  Skin: Skin is warm and dry.  Psychiatric: Her behavior is normal. Judgment and thought content normal. Her mood appears anxious. Cognition and memory are normal.  Nursing note and vitals reviewed.    ED Treatments / Results  Labs (all labs ordered are listed, but only abnormal results are displayed) Labs Reviewed  CBC WITH DIFFERENTIAL/PLATELET - Abnormal; Notable for the following:       Result Value   WBC 10.6 (*)    Hemoglobin 15.1 (*)    All other components within normal limits  COMPREHENSIVE METABOLIC PANEL - Abnormal; Notable for the following:    Potassium 3.3 (*)    Albumin 5.1 (*)    All other components within normal limits  LIPASE, BLOOD  ETHANOL    EKG  EKG Interpretation None       Radiology Dg Chest 2 View  Result Date: 02/18/2016 CLINICAL DATA:  Weight loss. EXAM: CHEST  2 VIEW COMPARISON:  None. FINDINGS: The heart size and mediastinal contours are within normal limits. Both lungs are clear. No  pneumothorax or pleural effusion is noted. The visualized skeletal structures are unremarkable. IMPRESSION: No active cardiopulmonary disease. Electronically Signed   By: Lupita Raider, M.D.   On: 02/18/2016 09:27   Ct Head Wo Contrast  Result Date: 02/18/2016 CLINICAL DATA:  Headaches. EXAM: CT HEAD WITHOUT CONTRAST TECHNIQUE: Contiguous axial images were obtained from the base of the skull through the vertex without intravenous contrast. COMPARISON:  None. FINDINGS: Bony calvarium appears intact. No mass effect or midline shift is noted. Ventricular size is within normal limits. There is no evidence of mass lesion, hemorrhage or acute infarction. IMPRESSION: Normal head CT. Electronically Signed   By: Lupita Raider, M.D.   On: 02/18/2016 11:02   Ct Abdomen Pelvis W Contrast  Result Date: 02/18/2016 CLINICAL DATA:  Anorexia.  Weight loss.  Alcohol abuse. EXAM: CT ABDOMEN AND PELVIS WITH CONTRAST TECHNIQUE: Multidetector CT imaging of the abdomen and pelvis was performed using the standard protocol following bolus administration of intravenous contrast. CONTRAST:  ISOVUE-300 IOPAMIDOL (ISOVUE-300) INJECTION 61% COMPARISON:  CT abdomen pelvis 04/07/2010 FINDINGS: Lower chest: Lung bases clear without infiltrate or effusion. Heart size upper normal. Hepatobiliary: 14 mm hypodensity right lobe liver posterior laterally is unchanged from 2011. This may represent a hemangioma although it does not enhance on delayed images. This could also be a complex cyst or scar. There is some retraction of the overlying cortex. No other liver lesion. Gallbladder and bile ducts normal. Pancreas: Negative Spleen: Negative Adrenals/Urinary Tract: Negative for renal mass or obstruction. Small nonobstructing calculus on the left measuring 3 mm midpole. Normal adrenal gland bilaterally. Urinary bladder empty without significant abnormality. Stomach/Bowel: Stomach and duodenum normal. Negative for bowel obstruction. Sigmoid  diverticulosis without diverticulitis. Normal appendix. Vascular/Lymphatic: Minimal atherosclerotic calcification in the aorta. No aneurysm. No lymphadenopathy. Reproductive: Normal uterus.  No pelvic mass. Other: No free fluid. Musculoskeletal: Negative for ventral hernia. No acute skeletal abnormality. Mild facet degeneration L4-5 and L5-S1. IMPRESSION: No acute abnormality in the  abdomen. Sigmoid diverticulosis without diverticulitis.  Normal appendix 14 mm right liver lesion is stable since 2011 3 mm nonobstructing stone left midpole. Electronically Signed   By: Marlan Palau M.D.   On: 02/18/2016 11:21    Procedures Procedures (including critical care time)  Medications Ordered in ED Medications  0.9 %  sodium chloride infusion (0 mLs Intravenous Stopped 02/18/16 1019)    Followed by  0.9 %  sodium chloride infusion (0 mLs Intravenous Stopped 02/18/16 1136)  diatrizoate meglumine-sodium (GASTROGRAFIN) 66-10 % solution 15 mL (15 mLs Oral Given 02/18/16 0922)  iopamidol (ISOVUE-300) 61 % injection 100 mL (100 mLs Intravenous Contrast Given 02/18/16 1040)     Initial Impression / Assessment and Plan / ED Course  I have reviewed the triage vital signs and the nursing notes.  Pertinent labs & imaging results that were available during my care of the patient were reviewed by me and considered in my medical decision making (see chart for details).  Clinical Course    Significant workup in emergency department without obvious abnormality.  Patient will need ongoing close follow-up with her primary care physician and likely GI given the occasional episodes of food lodging.  She may require endoscopy.  There is deathly a anxiety component to this but I believe that all medical portion should be completely evaluated.  No indication for additional workup today in emergency department.  Well appearing.  Ambulatory.  Discharge home in good condition.  I have recommended that she decrease her alcohol  intake  Final Clinical Impressions(s) / ED Diagnoses   Final diagnoses:  Tremor  Anorexia  Weight loss    New Prescriptions Discharge Medication List as of 02/18/2016 11:29 AM       Azalia Bilis, MD 02/18/16 1422

## 2016-02-18 NOTE — ED Notes (Signed)
PT aware of urine sample 

## 2016-02-18 NOTE — ED Notes (Signed)
Pt states she has lost her appetite recently. Pt has lost 25lbs in the last 3 months without trying.

## 2016-02-18 NOTE — ED Triage Notes (Signed)
Patient c/o tremors x 2 days.  Patient has pain in hips, knees, shins over 3 months.  Patient states that she has anxiety and was off her meds for 1 day and another for 2 days.  Patient states that 6 weeks ago she was exposed to lead dust.

## 2016-10-12 ENCOUNTER — Encounter (HOSPITAL_COMMUNITY): Payer: Self-pay | Admitting: Emergency Medicine

## 2016-10-12 ENCOUNTER — Emergency Department (HOSPITAL_COMMUNITY)
Admission: EM | Admit: 2016-10-12 | Discharge: 2016-10-13 | Disposition: A | Discharge status: Home / Self Care | Payer: Medicare Other | Source: Home / Self Care | Attending: Emergency Medicine | Admitting: Emergency Medicine

## 2016-10-12 DIAGNOSIS — G2581 Restless legs syndrome: Secondary | ICD-10-CM

## 2016-10-12 DIAGNOSIS — Z87891 Personal history of nicotine dependence: Secondary | ICD-10-CM

## 2016-10-12 DIAGNOSIS — E039 Hypothyroidism, unspecified: Secondary | ICD-10-CM | POA: Insufficient documentation

## 2016-10-12 DIAGNOSIS — Z79899 Other long term (current) drug therapy: Secondary | ICD-10-CM | POA: Diagnosis not present

## 2016-10-12 DIAGNOSIS — F19230 Other psychoactive substance dependence with withdrawal, uncomplicated: Secondary | ICD-10-CM | POA: Insufficient documentation

## 2016-10-12 DIAGNOSIS — R251 Tremor, unspecified: Secondary | ICD-10-CM | POA: Diagnosis present

## 2016-10-12 MED ORDER — PRAMIPEXOLE DIHYDROCHLORIDE 0.25 MG PO TABS
0.7500 mg | ORAL_TABLET | Freq: Three times a day (TID) | ORAL | Status: AC
  Administered 2016-10-12: 0.75 mg via ORAL
  Filled 2016-10-12: qty 3

## 2016-10-12 NOTE — ED Provider Notes (Signed)
[facility]-EMERGENCY DEPT Provider Note   CSN: [id number] Arrival date & time: 10/12/16  2136  By signing my name below, I, Nelwyn Salisbury, attest that this documentation has been prepared under the direction and in the presence of Santos Hardwick, MD . Electronically Signed: Nelwyn Salisbury, Scribe. 10/12/2016. 11:05 PM.   History   Chief Complaint Chief Complaint  Patient presents with  . medication withdrawl   The history is provided by the patient. No language interpreter was used.  Illness  This is a new problem. The current episode started more than 2 days ago. The problem occurs constantly. The problem has not changed since onset.Associated symptoms include shortness of breath. Nothing aggravates the symptoms. Nothing relieves the symptoms. She has tried nothing for the symptoms. The treatment provided no relief.   HPI Comments:  Megan Singleton is a 66 y.o. female with pmhx of alcohol abuse, RLS, and Fibromyalgia who presents to the Emergency Department complaining of constant, unchanged tremors onset 3 days ago. Pt states that she has not been taking her pramipexole for her restless leg syndrome and believes she has been experiencing her tremors as a result of withdrawal. She reports associated shortness of breath. Pt has not tried anything for her symptoms PTA. Denies vomiting. Pt has a history of alcohol abuse and has not managed to successfully curb her addiction.   Past Medical History:  Diagnosis Date  . Alcohol abuse   . Allergy   . Depression   . Fibromyalgia   . GERD (gastroesophageal reflux disease)   . Hashimoto's thyroiditis   . Hyperlipidemia   . IBS (irritable bowel syndrome)   . Osteopenia   . RLS (restless legs syndrome)   . Seizure disorder [facility])    she is on life long medication per Neuro and can not have driving privileges if she  stops meds  . Seizures (HCC)   . Thyroid disease     Patient Active Problem List   Diagnosis Date Noted  . Panic attacks  11/19/2014  . Pap smear for cervical cancer screening 05/21/2014  . Cough 09/27/2013  . Thoracic back pain 08/18/2011  . Conjunctivitis 07/16/2011  . General medical examination 01/06/2011  . DYSPAREUNIA 02/10/2010  . VAGINITIS, ATROPHIC 02/10/2010  . PILONIDAL CYST 02/10/2010  . PNEUMONIA 01/16/2010  . INCONTINENCE, MIXED, URGE/STRESS 01/16/2010  . CHEST PAIN UNSPECIFIED 10/04/2009  . MALAISE AND FATIGUE 09/10/2009  . DIARRHEA, CHRONIC 09/10/2009  . VISUAL CHANGES 06/10/2009  . GANGLION CYST, WRIST, LEFT 06/10/2009  . POSTMENOPAUSAL STATUS 06/10/2009  . PLANTAR FASCIITIS 12/13/2008  . RESTLESS LEGS SYNDROME 09/05/2008  . Impacted cerumen 07/23/2008  . DEPRESSION 07/04/2008  . OTITIS EXTERNA 07/04/2008  . VIRAL URI 07/04/2008  . SWEATING 08/11/2007  . Hyperlipidemia 08/02/2007  . ALCOHOL ABUSE 08/02/2007  . SINUSITIS- ACUTE-NOS 05/11/2007  . ALLERGIC RHINITIS 05/11/2007  . PAIN IN JOINT, HAND 03/09/2007  . Hypothyroidism 12/15/2006  . HEMORRHOIDS 12/15/2006  . GERD 12/15/2006  . MICROSCOPIC HEMATURIA 12/15/2006  . OSTEOPENIA 12/15/2006  . Seizure disorder (HCC) 12/15/2006    Past Surgical History:  Procedure Laterality Date  . BUNIONECTOMY     Right and Hammertoe  . CESAREAN SECTION  4098,[id number]   x's 2  . FOOT SURGERY     Left--Neuroma, Bunion  . LIPOSUCTION     abdominal  . TONSILLECTOMY     as a child  . WRIST SURGERY     Right wrist---Benign mass    OB History    No data available  Home Medications    Prior to Admission medications   Medication Sig Start Date End Date Taking? Authorizing Provider  desloratadine (CLARINEX) 5 MG tablet Take 1 tablet by mouth daily. 05/05/14  Yes Historical Provider, MD  diphenhydrAMINE (RESTFULLY SLEEP) 25 MG tablet Take 75 mg by mouth at bedtime as needed for sleep.   Yes Historical Provider, MD  FLUoxetine (PROZAC) 40 MG capsule Take 40 mg by mouth every morning.   Yes Historical Provider, MD  fluticasone  (FLONASE) 50 MCG/ACT nasal spray USE 2 SPRAYS IN EACH NOSTRIL DAILY Patient taking differently: USE 2 SPRAYS IN EACH NOSTRIL DAILY PRN FOR ALLERGIES 01/31/13  Yes Sheliah HatchKatherine E Tabori, MD  gabapentin (NEURONTIN) 600 MG tablet Take 600 mg by mouth 2 (two) times daily.  09/11/16  Yes Historical Provider, MD  hydrOXYzine (VISTARIL) 25 MG capsule Take 25-50 mg by mouth every 8 (eight) hours as needed for anxiety.  05/18/14  Yes Historical Provider, MD  lamoTRIgine (LAMICTAL) 200 MG tablet Take 200 mg by mouth daily.  10/30/14  Yes Historical Provider, MD  levothyroxine (SYNTHROID, LEVOTHROID) 112 MCG tablet TAKE 1 TABLET BY MOUTH EVERY DAY 10/18/15  Yes Sheliah HatchKatherine E Tabori, MD  pantoprazole (PROTONIX) 40 MG tablet TAKE 1 TABLET BY MOUTH EVERY DAY 10/05/15  Yes Sheliah HatchKatherine E Tabori, MD  pramipexole (MIRAPEX) 1 MG tablet Take 3 mg by mouth at bedtime.    Yes Historical Provider, MD  simvastatin (ZOCOR) 20 MG tablet TAKE 1 TABLET BY MOUTH EVERY NIGHT AT BEDTIME 10/18/15  Yes Sheliah HatchKatherine E Tabori, MD  ibuprofen (ADVIL,MOTRIN) 200 MG tablet Take 400 mg by mouth every 6 (six) hours as needed for moderate pain.    Historical Provider, MD  Olopatadine HCl 0.6 % SOLN Place 2 drops into the nose daily as needed (for allergies and congestin).    Historical Provider, MD    Family History Family History  Problem Relation Age of Onset  . Breast cancer Mother   . Hyperlipidemia Mother   . Prostate cancer Father   . Hyperlipidemia Father   . Depression Father   . Cancer Cousin     Colon Cancer  . Diabetes Other     Maternal Grandparent  . Kidney disease Other     Maternal Grandparent  . Stroke Other     Grandparent  . Heart disease Other     Grand Parent    Social History Social History  Substance Use Topics  . Smoking status: Former Smoker    Quit date: 07/21/1979  . Smokeless tobacco: Never Used  . Alcohol use Yes     Comment: 1-2 drinks per night     Allergies   Morphine   Review of Systems Review  of Systems  Respiratory: Positive for shortness of breath.   Gastrointestinal: Negative for vomiting.  Neurological: Positive for tremors.  All other systems reviewed and are negative.    Physical Exam Updated Vital Signs BP (!) 121/54   Pulse 68   Resp 20   SpO2 99%   Physical Exam  Constitutional: She appears well-developed and well-nourished. No distress.  HENT:  Head: Normocephalic and atraumatic.  Mouth/Throat: Oropharynx is clear and moist. No oropharyngeal exudate.  Eyes: Conjunctivae are normal. Pupils are equal, round, and reactive to light.  Cardiovascular: Normal rate, regular rhythm, normal heart sounds and intact distal pulses.   Pulmonary/Chest: Effort normal and breath sounds normal. No stridor.  Abdominal: Soft. Bowel sounds are normal. There is no rebound and no guarding.  Neurological:  She is alert. She has normal reflexes.  Skin: Skin is warm and dry.  Psychiatric:  Pt is anxious and pacing.   Nursing note and vitals reviewed.    ED Treatments / Results  DIAGNOSTIC STUDIES:  Oxygen Saturation is 99% on RA, normal by my interpretation.    COORDINATION OF CARE:  11:20 PM Discussed treatment plan with pt at bedside which includes consult with pharmacy to check the status of the drug requested and pt agreed to plan.  Procedures Procedures (including critical care time)  Medications Ordered in ED  Medications  pramipexole (MIRAPEX) tablet 0.75 mg (0.75 mg Oral Given 10/12/16 2346)      This is a 66 y.o. -year-old female presents with RLS and stating her doctor did not refill her medication for some reason.  The patient is nontoxic-appearing on exam and vital signs are within normal limits.    According to Pharmacist, JC, Pt has been taking an increased dose of Mirapex, much higher than a patient with RLS should be taking but should not require a taper at RLS dose. Per Care Everywhere patient has not been following up with her doctors, just getting RX  called in. As pharmacist states the dose is not to exceed 0.75mg  at bedtime for RLS we have provided a one time dose of this medication and she will need to follow up with her PMD and or neurologist to discuss her meds. On exam her legs are not restless she is just pacing.  My suspicion is she has been intentionally taking too much of this medication and given her ongoing alcoholism we will not be prescribing an RX.    After history, exam, and medical workup I feel the patient has been appropriately medically screened and is safe for discharge home. Pertinent diagnoses were discussed with the patient. Patient was given return precautions.   I personally performed the services described in this documentation, which was scribed in my presence. The recorded information has been reviewed and is accurate.      Cy Blamer, MD 10/13/16 0003

## 2016-10-12 NOTE — ED Triage Notes (Signed)
Pt states she has not had her medication for her restless leg syndrome for the past 3 days  Pt states she has an appt with her PCP next week  Pt states she has taken [place] restful leg 6 tabs in the past 6 hours and 2 visteril to help with the withdrawal  Pt is shaking and gittery in triage and states she feels short of breath

## 2016-10-13 ENCOUNTER — Emergency Department (HOSPITAL_COMMUNITY)
Admission: EM | Admit: 2016-10-13 | Discharge: 2016-10-13 | Disposition: A | Discharge status: Home / Self Care | Payer: Medicare Other | Attending: Emergency Medicine | Admitting: Emergency Medicine

## 2016-10-13 ENCOUNTER — Encounter (HOSPITAL_COMMUNITY): Payer: Self-pay | Admitting: *Deleted

## 2016-10-13 DIAGNOSIS — F19939 Other psychoactive substance use, unspecified with withdrawal, unspecified: Secondary | ICD-10-CM

## 2016-10-13 DIAGNOSIS — G251 Drug-induced tremor: Secondary | ICD-10-CM

## 2016-10-13 DIAGNOSIS — F19239 Other psychoactive substance dependence with withdrawal, unspecified: Secondary | ICD-10-CM

## 2016-10-13 LAB — CBC WITH DIFFERENTIAL/PLATELET
BASOS ABS: 0 10*3/uL (ref 0.0–0.1)
BASOS PCT: 0 %
EOS ABS: 0 10*3/uL (ref 0.0–0.7)
EOS PCT: 0 %
HCT: 39.6 % (ref 36.0–46.0)
HEMOGLOBIN: 13.6 g/dL (ref 12.0–15.0)
Lymphocytes Relative: 33 %
Lymphs Abs: 3.8 10*3/uL (ref 0.7–4.0)
MCH: 30.8 pg (ref 26.0–34.0)
MCHC: 34.3 g/dL (ref 30.0–36.0)
MCV: 89.8 fL (ref 78.0–100.0)
Monocytes Absolute: 0.4 10*3/uL (ref 0.1–1.0)
Monocytes Relative: 4 %
Neutro Abs: 7.2 10*3/uL (ref 1.7–7.7)
Neutrophils Relative %: 63 %
PLATELETS: 249 10*3/uL (ref 150–400)
RBC: 4.41 MIL/uL (ref 3.87–5.11)
RDW: 13.4 % (ref 11.5–15.5)
WBC: 11.4 10*3/uL — AB (ref 4.0–10.5)

## 2016-10-13 LAB — COMPREHENSIVE METABOLIC PANEL
ALBUMIN: 4.5 g/dL (ref 3.5–5.0)
ALK PHOS: 80 U/L (ref 38–126)
ALT: 19 U/L (ref 14–54)
AST: 23 U/L (ref 15–41)
Anion gap: 9 (ref 5–15)
BUN: 19 mg/dL (ref 6–20)
CHLORIDE: 109 mmol/L (ref 101–111)
CO2: 21 mmol/L — AB (ref 22–32)
CREATININE: 0.78 mg/dL (ref 0.44–1.00)
Calcium: 9.2 mg/dL (ref 8.9–10.3)
GFR calc non Af Amer: >60 mL/min (ref >60)
GLUCOSE: 109 mg/dL — AB (ref 65–99)
Potassium: 3.5 mmol/L (ref 3.5–5.1)
SODIUM: 139 mmol/L (ref 135–145)
Total Bilirubin: 1 mg/dL (ref 0.3–1.2)
Total Protein: 7.1 g/dL (ref 6.5–8.1)

## 2016-10-13 MED ORDER — LORAZEPAM 2 MG/ML IJ SOLN
1.0000 mg | Freq: Once | INTRAMUSCULAR | Status: AC
  Administered 2016-10-13: 1 mg via INTRAVENOUS

## 2016-10-13 MED ORDER — LORAZEPAM 2 MG/ML IJ SOLN
1.0000 mg | Freq: Once | INTRAMUSCULAR | Status: DC
  Filled 2016-10-13: qty 1

## 2016-10-13 MED ORDER — DIAZEPAM 5 MG PO TABS: ORAL_TABLET | ORAL | 0 refills | Status: DC

## 2016-10-13 MED ORDER — ONDANSETRON 4 MG PO TBDP: 4.0000 mg | ORAL_TABLET | Freq: Three times a day (TID) | ORAL | 0 refills | Status: DC | PRN

## 2016-10-13 MED ORDER — SODIUM CHLORIDE 0.9 % IV BOLUS (SEPSIS)
1000.0000 mL | Freq: Once | INTRAVENOUS | Status: AC
Start: 2016-10-13 — End: 2016-10-13
  Administered 2016-10-13: 1000 mL via INTRAVENOUS

## 2016-10-13 MED ORDER — LORAZEPAM 1 MG PO TABS
2.0000 mg | ORAL_TABLET | Freq: Once | ORAL | Status: DC
  Filled 2016-10-13: qty 2

## 2016-10-13 NOTE — ED Notes (Signed)
Pt was able to ambulate in the hallway with no assistance

## 2016-10-13 NOTE — ED Provider Notes (Signed)
[facility]-EMERGENCY DEPT Provider Note   CSN: [id number] Arrival date & time: 10/13/16  1443     History   Chief Complaint Chief Complaint  Patient presents with  . Shaking    HPI Megan Singleton is a 66 y.o. female.  Patient returns to the emergency department with recurrent/persistent symptoms of restlessness, shaking, nausea, anxiety after abruptly stopping her Mirapex 3 days ago. She reports she has been on a stable dose for years and missed her PCP appointment to obtain refill. She was seen and evaluated in the emergency department last night and dosed with Mirapex with improvement in symptoms. Today she filled a prescription and took her regular dose approximately 4 hours ago (1:00 pm). She again got some temporary relief of symptoms but they returned and were worse in intensity. No fever, vomiting, diarrhea. Per husband, no disorientation or confusion.    The history is provided by the patient and the spouse. No language interpreter was used.    Past Medical History:  Diagnosis Date  . Alcohol abuse   . Allergy   . Depression   . Fibromyalgia   . GERD (gastroesophageal reflux disease)   . Hashimoto's thyroiditis   . Hyperlipidemia   . IBS (irritable bowel syndrome)   . Osteopenia   . RLS (restless legs syndrome)   . Seizure disorder [facility](HCC)    she is on life long medication per Neuro and can not have driving privileges if she  stops meds  . Seizures (HCC)   . Thyroid disease     Patient Active Problem List   Diagnosis Date Noted  . Panic attacks 11/19/2014  . Pap smear for cervical cancer screening 05/21/2014  . Cough 09/27/2013  . Thoracic back pain 08/18/2011  . Conjunctivitis 07/16/2011  . General medical examination 01/06/2011  . DYSPAREUNIA 02/10/2010  . VAGINITIS, ATROPHIC 02/10/2010  . PILONIDAL CYST 02/10/2010  . PNEUMONIA 01/16/2010  . INCONTINENCE, MIXED, URGE/STRESS 01/16/2010  . CHEST PAIN UNSPECIFIED 10/04/2009  . MALAISE AND FATIGUE 09/10/2009   . DIARRHEA, CHRONIC 09/10/2009  . VISUAL CHANGES 06/10/2009  . GANGLION CYST, WRIST, LEFT 06/10/2009  . POSTMENOPAUSAL STATUS 06/10/2009  . PLANTAR FASCIITIS 12/13/2008  . RESTLESS LEGS SYNDROME 09/05/2008  . Impacted cerumen 07/23/2008  . DEPRESSION 07/04/2008  . OTITIS EXTERNA 07/04/2008  . VIRAL URI 07/04/2008  . SWEATING 08/11/2007  . Hyperlipidemia 08/02/2007  . ALCOHOL ABUSE 08/02/2007  . SINUSITIS- ACUTE-NOS 05/11/2007  . ALLERGIC RHINITIS 05/11/2007  . PAIN IN JOINT, HAND 03/09/2007  . Hypothyroidism 12/15/2006  . HEMORRHOIDS 12/15/2006  . GERD 12/15/2006  . MICROSCOPIC HEMATURIA 12/15/2006  . OSTEOPENIA 12/15/2006  . Seizure disorder (HCC) 12/15/2006    Past Surgical History:  Procedure Laterality Date  . BUNIONECTOMY     Right and Hammertoe  . CESAREAN SECTION  [id number]   x's 2  . FOOT SURGERY     Left--Neuroma, Bunion  . LIPOSUCTION     abdominal  . TONSILLECTOMY     as a child  . WRIST SURGERY     Right wrist---Benign mass    OB History    No data available       Home Medications    Prior to Admission medications   Medication Sig Start Date End Date Taking? Authorizing Provider  desloratadine (CLARINEX) 5 MG tablet Take 1 tablet by mouth daily. 05/05/14   Historical Provider, MD  diphenhydrAMINE (RESTFULLY SLEEP) 25 MG tablet Take 75 mg by mouth at bedtime as needed for sleep.  Historical Provider, MD  FLUoxetine (PROZAC) 40 MG capsule Take 40 mg by mouth every morning.    Historical Provider, MD  fluticasone (FLONASE) 50 MCG/ACT nasal spray USE 2 SPRAYS IN EACH NOSTRIL DAILY Patient taking differently: USE 2 SPRAYS IN EACH NOSTRIL DAILY PRN FOR ALLERGIES 01/31/13   Sheliah Hatch, MD  gabapentin (NEURONTIN) 600 MG tablet Take 600 mg by mouth 2 (two) times daily.  09/11/16   Historical Provider, MD  hydrOXYzine (VISTARIL) 25 MG capsule Take 25-50 mg by mouth every 8 (eight) hours as needed for anxiety.  05/18/14   Historical Provider, MD    ibuprofen (ADVIL,MOTRIN) 200 MG tablet Take 400 mg by mouth every 6 (six) hours as needed for moderate pain.    Historical Provider, MD  lamoTRIgine (LAMICTAL) 200 MG tablet Take 200 mg by mouth daily.  10/30/14   Historical Provider, MD  levothyroxine (SYNTHROID, LEVOTHROID) 112 MCG tablet TAKE 1 TABLET BY MOUTH EVERY DAY 10/18/15   Sheliah Hatch, MD  Olopatadine HCl 0.6 % SOLN Place 2 drops into the nose daily as needed (for allergies and congestin).    Historical Provider, MD  pantoprazole (PROTONIX) 40 MG tablet TAKE 1 TABLET BY MOUTH EVERY DAY 10/05/15   Sheliah Hatch, MD  pramipexole (MIRAPEX) 1 MG tablet Take 3 mg by mouth at bedtime.     Historical Provider, MD  simvastatin (ZOCOR) 20 MG tablet TAKE 1 TABLET BY MOUTH EVERY NIGHT AT BEDTIME 10/18/15   Sheliah Hatch, MD    Family History Family History  Problem Relation Age of Onset  . Breast cancer Mother   . Hyperlipidemia Mother   . Prostate cancer Father   . Hyperlipidemia Father   . Depression Father   . Cancer Cousin     Colon Cancer  . Diabetes Other     Maternal Grandparent  . Kidney disease Other     Maternal Grandparent  . Stroke Other     Grandparent  . Heart disease Other     Grand Parent    Social History Social History  Substance Use Topics  . Smoking status: Former Smoker    Quit date: 07/21/1979  . Smokeless tobacco: Never Used  . Alcohol use Yes     Comment: 1-2 drinks per night     Allergies   Morphine   Review of Systems Review of Systems  Constitutional: Negative for chills and fever.  HENT: Negative.  Negative for trouble swallowing.   Eyes: Negative.  Negative for visual disturbance.  Respiratory: Negative.  Negative for shortness of breath.   Cardiovascular: Negative.  Negative for chest pain.  Gastrointestinal: Positive for nausea. Negative for abdominal pain and vomiting.  Genitourinary: Negative.   Musculoskeletal: Negative.   Skin: Negative.  Negative for color change  and rash.  Neurological: Positive for tremors.     Physical Exam Updated Vital Signs BP 137/71   Pulse 65   Temp 98.3 F (36.8 C)   Resp 20   Ht 5\' 4"  (1.626 m)   Wt 64 kg   SpO2 98%   BMI 24.20 kg/m   Physical Exam  Constitutional: She is oriented to person, place, and time. She appears well-developed and well-nourished.  HENT:  Head: Normocephalic.  Neck: Normal range of motion. Neck supple.  Cardiovascular: Normal rate and regular rhythm.   Pulmonary/Chest: Effort normal and breath sounds normal. She has no wheezes. She has no rales.  Abdominal: Soft. Bowel sounds are normal. There is no tenderness.  There is no rebound and no guarding.  Musculoskeletal: Normal range of motion.  No muscular rigidity.  Neurological: She is alert and oriented to person, place, and time. She has normal strength. She exhibits normal muscle tone. Coordination normal. GCS eye subscore is 4. GCS verbal subscore is 5. GCS motor subscore is 6.  Skin: Skin is warm and dry. No rash noted.  Psychiatric: She has a normal mood and affect.     ED Treatments / Results  Labs (all labs ordered are listed, but only abnormal results are displayed) Labs Reviewed  CBC WITH DIFFERENTIAL/PLATELET  COMPREHENSIVE METABOLIC PANEL   Results for orders placed or performed during the hospital encounter of 10/13/16  CBC with Differential  Result Value Ref Range   WBC 11.4 (H) 4.0 - 10.5 K/uL   RBC 4.41 3.87 - 5.11 MIL/uL   Hemoglobin 13.6 12.0 - 15.0 g/dL   HCT 16.1 09.6 - 04.5 %   MCV 89.8 78.0 - 100.0 fL   MCH 30.8 26.0 - 34.0 pg   MCHC 34.3 30.0 - 36.0 g/dL   RDW 40.9 81.1 - 91.4 %   Platelets 249 150 - 400 K/uL   Neutrophils Relative % 63 %   Neutro Abs 7.2 1.7 - 7.7 K/uL   Lymphocytes Relative 33 %   Lymphs Abs 3.8 0.7 - 4.0 K/uL   Monocytes Relative 4 %   Monocytes Absolute 0.4 0.1 - 1.0 K/uL   Eosinophils Relative 0 %   Eosinophils Absolute 0.0 0.0 - 0.7 K/uL   Basophils Relative 0 %    Basophils Absolute 0.0 0.0 - 0.1 K/uL  Comprehensive metabolic panel  Result Value Ref Range   Sodium 139 135 - 145 mmol/L   Potassium 3.5 3.5 - 5.1 mmol/L   Chloride 109 101 - 111 mmol/L   CO2 21 (L) 22 - 32 mmol/L   Glucose, Bld 109 (H) 65 - 99 mg/dL   BUN 19 6 - 20 mg/dL   Creatinine, Ser 7.82 0.44 - 1.00 mg/dL   Calcium 9.2 8.9 - 95.6 mg/dL   Total Protein 7.1 6.5 - 8.1 g/dL   Albumin 4.5 3.5 - 5.0 g/dL   AST 23 15 - 41 U/L   ALT 19 14 - 54 U/L   Alkaline Phosphatase 80 38 - 126 U/L   Total Bilirubin 1.0 0.3 - 1.2 mg/dL   GFR calc non Af Amer >60 >60 mL/min   GFR calc Af Amer >60 >60 mL/min   Anion gap 9 5 - 15    EKG  EKG Interpretation None       Radiology No results found.  Procedures Procedures (including critical care time)  Medications Ordered in ED Medications  sodium chloride 0.9 % bolus 1,000 mL (not administered)  LORazepam (ATIVAN) injection 1 mg (not administered)     Initial Impression / Assessment and Plan / ED Course  I have reviewed the triage vital signs and the nursing notes.  Pertinent labs & imaging results that were available during my care of the patient were reviewed by me and considered in my medical decision making (see chart for details).     Patient presents with presumed withdrawal symptoms from Mirapex which was abruptly stopped 3 days ago, resumed today without resolution or significant relief of symptoms. No vomiting but she reports persistent nausea.   Discussed with pharmacist who reports risk for NMS. No fever, muscular rigidity, abnormal VS or disorientation/confusion. Doubt Neuroleptic Malignant Syndrome.   Will give fluids and  IV Ativan. Check baseline labs and observe. VSS  Ativan provided with some improvement in symptoms. She is resting. Still have some tremor/muscular fasciculations. Will ambulate and reassess but feel she will be able to go home.   Ambulates well without new or worsening symptoms. Patient and  husband comfortable with discharge home.   Final Clinical Impressions(s) / ED Diagnoses   Final diagnoses:  None   1. Tremor due to drug withdrawal  New Prescriptions New Prescriptions   No medications on file     Elpidio Anis, PA-C 10/15/16 0106    Arby Barrette, MD 10/18/16 1540

## 2016-10-13 NOTE — ED Triage Notes (Signed)
Pt reports the shaking is severe and is making her very anxious.  She was seen for same last night and was instructed to f/u with her PCP today, went today and was advised to come back to the ED.  Pt reports she feels miserable.  States she ran out of her mirapex for 3 days and was told that she was having withdrawals from it.  She was given a dose here last night and was sent home.  She reports she took 3 pills today to "fix it" but it's not stopping the shakes and her anxiety.  Pt reports pain "inside."

## 2016-12-09 ENCOUNTER — Encounter (HOSPITAL_COMMUNITY)
Admission: RE | Disposition: A | Discharge status: Home / Self Care | Payer: Self-pay | Source: Ambulatory Visit | Attending: Gastroenterology

## 2016-12-09 ENCOUNTER — Ambulatory Visit (HOSPITAL_COMMUNITY)
Admission: RE | Admit: 2016-12-09 | Discharge: 2016-12-09 | Disposition: A | Discharge status: Home / Self Care | Payer: Medicare Other | Source: Ambulatory Visit | Attending: Gastroenterology | Admitting: Gastroenterology

## 2016-12-09 DIAGNOSIS — R1310 Dysphagia, unspecified: Secondary | ICD-10-CM | POA: Diagnosis present

## 2016-12-09 DIAGNOSIS — K228 Other specified diseases of esophagus: Secondary | ICD-10-CM | POA: Diagnosis not present

## 2016-12-09 HISTORY — PX: ESOPHAGEAL MANOMETRY: SHX5429

## 2016-12-09 SURGERY — MANOMETRY, ESOPHAGUS

## 2016-12-09 MED ORDER — LIDOCAINE VISCOUS 2 % MT SOLN
OROMUCOSAL | Status: AC
  Filled 2016-12-09: qty 15

## 2016-12-09 SURGICAL SUPPLY — 2 items
FACESHIELD LNG OPTICON STERILE (SAFETY) IMPLANT
GLOVE BIO SURGEON STRL SZ8 (GLOVE) ×4 IMPLANT

## 2016-12-09 NOTE — Progress Notes (Signed)
Esophageal manometry done per protocol.  Pt. tollerated well, without complication.  Dr. Bosie ClosSchooler to be notified today.    Megan BlackwaterShelby Kahlee Metivier, RN

## 2016-12-10 ENCOUNTER — Encounter (HOSPITAL_COMMUNITY): Payer: Self-pay | Admitting: Gastroenterology

## 2016-12-11 ENCOUNTER — Encounter (HOSPITAL_COMMUNITY): Admission: RE | Payer: Self-pay | Source: Ambulatory Visit

## 2016-12-11 ENCOUNTER — Ambulatory Visit (HOSPITAL_COMMUNITY): Admission: RE | Admit: 2016-12-11 | Payer: Medicare Other | Source: Ambulatory Visit | Admitting: Gastroenterology

## 2016-12-11 SURGERY — MANOMETRY, ESOPHAGUS

## 2017-11-19 ENCOUNTER — Emergency Department (HOSPITAL_COMMUNITY)
Admission: EM | Admit: 2017-11-19 | Discharge: 2017-11-19 | Discharge status: Against Medical Advice | Payer: Medicare Other | Attending: Emergency Medicine | Admitting: Emergency Medicine

## 2017-11-19 ENCOUNTER — Encounter (HOSPITAL_COMMUNITY): Payer: Self-pay | Admitting: Emergency Medicine

## 2017-11-19 ENCOUNTER — Other Ambulatory Visit: Payer: Self-pay

## 2017-11-19 DIAGNOSIS — Z5321 Procedure and treatment not carried out due to patient leaving prior to being seen by health care provider: Secondary | ICD-10-CM | POA: Insufficient documentation

## 2017-11-19 DIAGNOSIS — R21 Rash and other nonspecific skin eruption: Secondary | ICD-10-CM | POA: Diagnosis present

## 2017-11-19 NOTE — ED Triage Notes (Signed)
Pt has itching rash on l/back x 2 week. Denies pain. Seen at an urgent care facility. Did not start on prednisone as prescribed.  Pt has been taking an antihistamine due to panic attacks.

## 2017-11-19 NOTE — ED Notes (Signed)
Pt called for room at 2

## 2019-01-09 ENCOUNTER — Other Ambulatory Visit: Payer: Self-pay | Admitting: Gastroenterology

## 2019-01-09 DIAGNOSIS — R1310 Dysphagia, unspecified: Secondary | ICD-10-CM

## 2019-01-12 ENCOUNTER — Other Ambulatory Visit: Payer: Medicare Other

## 2019-01-24 ENCOUNTER — Ambulatory Visit
Admission: RE | Admit: 2019-01-24 | Discharge: 2019-01-24 | Disposition: A | Discharge status: Home / Self Care | Payer: Medicare Other | Source: Ambulatory Visit | Attending: Gastroenterology | Admitting: Gastroenterology

## 2019-01-24 DIAGNOSIS — R1310 Dysphagia, unspecified: Secondary | ICD-10-CM

## 2019-02-04 ENCOUNTER — Emergency Department (HOSPITAL_COMMUNITY)
Admission: EM | Admit: 2019-02-04 | Discharge: 2019-02-04 | Disposition: A | Discharge status: Home / Self Care | Payer: Medicare Other | Attending: Emergency Medicine | Admitting: Emergency Medicine

## 2019-02-04 ENCOUNTER — Encounter (HOSPITAL_COMMUNITY): Payer: Self-pay | Admitting: Emergency Medicine

## 2019-02-04 ENCOUNTER — Other Ambulatory Visit: Payer: Self-pay

## 2019-02-04 ENCOUNTER — Emergency Department (HOSPITAL_COMMUNITY): Payer: Medicare Other

## 2019-02-04 DIAGNOSIS — E039 Hypothyroidism, unspecified: Secondary | ICD-10-CM | POA: Insufficient documentation

## 2019-02-04 DIAGNOSIS — Z87891 Personal history of nicotine dependence: Secondary | ICD-10-CM | POA: Insufficient documentation

## 2019-02-04 DIAGNOSIS — R079 Chest pain, unspecified: Secondary | ICD-10-CM | POA: Insufficient documentation

## 2019-02-04 DIAGNOSIS — Z79899 Other long term (current) drug therapy: Secondary | ICD-10-CM | POA: Insufficient documentation

## 2019-02-04 LAB — CBC
HCT: 41.1 % (ref 36.0–46.0)
Hemoglobin: 14.1 g/dL (ref 12.0–15.0)
MCH: 30.8 pg (ref 26.0–34.0)
MCHC: 34.3 g/dL (ref 30.0–36.0)
MCV: 89.7 fL (ref 80.0–100.0)
Platelets: 219 10*3/uL (ref 150–400)
RBC: 4.58 MIL/uL (ref 3.87–5.11)
RDW: 12.6 % (ref 11.5–15.5)
WBC: 11.3 10*3/uL — ABNORMAL HIGH (ref 4.0–10.5)
nRBC: 0 % (ref 0.0–0.2)

## 2019-02-04 LAB — BASIC METABOLIC PANEL
Anion gap: 15 (ref 5–15)
BUN: 13 mg/dL (ref 8–23)
CO2: 20 mmol/L — ABNORMAL LOW (ref 22–32)
Calcium: 9.3 mg/dL (ref 8.9–10.3)
Chloride: 106 mmol/L (ref 98–111)
Creatinine, Ser: 0.61 mg/dL (ref 0.44–1.00)
GFR calc Af Amer: >60 mL/min (ref >60)
GFR calc non Af Amer: >60 mL/min (ref >60)
Glucose, Bld: 116 mg/dL — ABNORMAL HIGH (ref 70–99)
Potassium: 3.2 mmol/L — ABNORMAL LOW (ref 3.5–5.1)
Sodium: 141 mmol/L (ref 135–145)

## 2019-02-04 LAB — TROPONIN I (HIGH SENSITIVITY): Troponin I (High Sensitivity): 3 ng/L (ref <18)

## 2019-02-04 MED ORDER — POTASSIUM CHLORIDE CRYS ER 20 MEQ PO TBCR
30.0000 meq | EXTENDED_RELEASE_TABLET | Freq: Once | ORAL | Status: AC
  Administered 2019-02-04: 30 meq via ORAL
  Filled 2019-02-04: qty 1

## 2019-02-04 NOTE — ED Triage Notes (Signed)
Pt reports last night had central chest pains that lasted an hour with nausea, "felt like phlegm that wouldn't move". Pt has hx choking, panic attacks and unsure if she took too much medications this morning because she has been really shaky since last night about same time as chest pains. Denies pain at this time.

## 2019-02-04 NOTE — ED Notes (Signed)
Pt states that she is currently having a panic attack coming on at this time. Keppra was increased to 1500mg  twice 2 weeks ago at patient's request.

## 2019-02-04 NOTE — Discharge Instructions (Addendum)
You have been seen today for chest pain. Please read and follow all provided instructions.  ° °1. Medications: usual home medications °2. Treatment: rest, drink plenty of fluids °3. Follow Up: Please follow up with your primary doctor in 2 days for discussion of your diagnoses and further evaluation after today's visit; if you do not have a primary care doctor use the resource guide provided to find one; Please return to the ER for any new or worsening symptoms. Please obtain all of your results from medical records or have your doctors office obtain the results - share them with your doctor - you should be seen at your doctors office. Call today to arrange your follow up.  ° °Take medications as prescribed. Please review all of the medicines and only take them if you do not have an allergy to them. Return to the emergency room for worsening condition or new concerning symptoms. Follow up with your regular doctor. If you don't have a regular doctor use one of the numbers below to establish a primary care doctor. ° °Please be aware that if you are taking birth control pills, taking other prescriptions, ESPECIALLY ANTIBIOTICS may make the birth control ineffective - if this is the case, either do not engage in sexual activity or use alternative methods of birth control such as condoms until you have finished the medicine and your family doctor says it is OK to restart them. If you are on a blood thinner such as COUMADIN, be aware that any other medicine that you take may cause the coumadin to either work too much, or not enough - you should have your coumadin level rechecked in next 7 days if this is the case.  °?  °It is also a possibility that you have an allergic reaction to any of the medicines that you have been prescribed - Everybody reacts differently to medications and while MOST people have no trouble with most medicines, you may have a reaction such as nausea, vomiting, rash, swelling, shortness of breath.  If this is the case, please stop taking the medicine immediately and contact your physician.  °?  °You should return to the ER if you develop severe or worsening symptoms.  ° °Emergency Department Resource Guide °1) Find a Doctor and Pay Out of Pocket °Although you won't have to find out who is covered by your insurance plan, it is a good idea to ask around and get recommendations. You will then need to call the office and see if the doctor you have chosen will accept you as a new patient and what types of options they offer for patients who are self-pay. Some doctors offer discounts or will set up payment plans for their patients who do not have insurance, but you will need to ask so you aren't surprised when you get to your appointment. ° °2) Contact Your Local Health Department °Not all health departments have doctors that can see patients for sick visits, but many do, so it is worth a call to see if yours does. If you don't know where your local health department is, you can check in your phone book. The CDC also has a tool to help you locate your state's health department, and many state websites also have listings of all of their local health departments. ° °3) Find a Walk-in Clinic °If your illness is not likely to be very severe or complicated, you may want to try a walk in clinic. These are popping up all over   the country in pharmacies, drugstores, and shopping centers. They're usually staffed by nurse practitioners or physician assistants that have been trained to treat common illnesses and complaints. They're usually fairly quick and inexpensive. However, if you have serious medical issues or chronic medical problems, these are probably not your best option. ° °No Primary Care Doctor: °Call [facility] at  832-8000 - they can help you locate a primary care doctor that  accepts your insurance, provides certain services, etc. °Physician Referral Service- 1-800-533-3463 ° °Chronic Pain  Problems: °Organization         Address  Phone   Notes  °[facility]  ([phone] Patients need to be referred by their primary care doctor.  ° °Medication Assistance: °Organization         Address  Phone   Notes  °[place] [facility] [address] °[place], [place] [address] [phone]) [phone] --Must be a resident of [place] °-- Must have NO insurance coverage whatsoever (no Medicaid/ Medicare, etc.) °-- The pt. MUST have a primary care doctor that directs their care regularly and follows them in the community °  °[facility]  (866) 331-1348   °[employer]  (888) 892-1162   ° °Agencies that provide inexpensive medical care: °Organization         Address  Phone   Notes  °[facility]  ([phone]) [phone]   °[facility]    ([phone]) 832-7272   °[facility] [address] °[place], [place] [address] ([phone]) [phone]   °[facility] [address], °[place] ([phone]) [phone]   °[facility]    ([phone]) 373-0678   °[facility]    ([phone]) 272-1050   °[facility] ° [address], [place] Phone:  (336) [phone], Fax:  ([phone] Hours of Operation:  9 am - 6 pm, M-F.  Also accepts Medicaid/Medicare and self-pay.  °[facility] ° [address], [place] Phone: ([phone]) [phone], Fax: ([phone]. Hours of Operation:  8:30 am - 5:30 pm, M-F.  Also accepts Medicaid and self-pay.  °[facility] [place] [address], [place] Phone: ([phone]) [phone]   °[facility] [address], [place], [place] (336)[phone], Ext. 123 Mondays & Thursdays: 7-9 AM.  First 15 patients are seen on a first come, first serve basis. °  ° °Medicaid-accepting [place] Providers: ° °Organization         Address  Phone   Notes  °[facility] [address], [place] [phone]) [phone] Also  accepts self-pay patients.  °[facility] [address], [place] ° ([phone]) [phone]   °[facility] [address], [place] ([phone]) [phone]   °[facility] [address] [place] [address], [place] ([phone]) [phone]   °[facility] [address], [place]  ° ([phone]) [phone] Only accepts [place] Access Medicaid patients after they have their name applied to their card.  ° °Self-Pay (no insurance) in [place]: ° °Organization         Address  Phone   Notes  °Sickle Cell Patients, [facility] [address], [place] ([phone]) [phone]   °[facility] [address], [place] ([phone]) [phone]   °[facility] ° [address], [place] [phone]) [phone]   °[facility]/Dr. Osei-Bonsu ° [address] [place] [address], [place] or [address], [place] [phone]) [phone] Phone number for both [place] and [place] locations is the same.  °[facility] [address], [place] ([phone]) [phone]   °[facility] [address] [place] [address], [place] or [address] ([phone]) [phone] °([phone]) [phone]   °[facility] [address], [place] ([phone]) [phone], phone; ([phone]) [phone], fax Sees patients 1st and 3rd Saturday of every month.  Must not qualify for public or private insurance (i.e. Medicaid, Medicare, [place] Health Choice, Veterans' Benefits)  Household income should be no more than 200% of the poverty level The clinic cannot treat you if you are pregnant or think you are pregnant  Sexually transmitted diseases are not treated at the clinic.  °  ° °

## 2019-02-04 NOTE — ED Provider Notes (Signed)
[facility] DEPT Provider Note   CSN: [id number] Arrival date & time: 02/04/19  1011    History   Chief Complaint Chief Complaint  Patient presents with  . Chest Pain    last night    HPI Megan Singleton is a 68 y.o. female with a PMH of Anxiety, Seizures on Keppra, Fibromyalgia, Depression, IBS, alcohol abuse, and RLS syndrome presenting with intermittent non radiating central chest pain onset last night at 11pm. Patient reports she was watching TV and sitting when symptoms started. Patient describes pain as pressure and states she had associated nausea, palpitations, and shortness of breath. Patient reports symptoms resolved on their own after 1 hour. Patient states she tried deep breathing with relief. Patient states stress made symptoms worse and states she is very stressed and anxious about COVID-19. Patient denies fever, cough, congestion, sore throat, sick exposures, or recent travel. Patient denies leg pain/edema, hx of DVT/PE, or cardiac history. Patient denies a family history of cardiac problems. Patient reports she has been feeling anxious this morning and is unsure if she took 1,000mg  or 1,500mg  of Keppra. Patient is prescribed to take 1,500mg  BID. Patient denies any recent seizures. Patient states she has not had a seizure in the past, but Keppra helps alleviate her panic attacks. Patient denies tobacco use. Patient reports daily alcohol use and states last drink was yesterday evening. Patient denies drug use. Patient denies any symptoms currently.      HPI  Past Medical History:  Diagnosis Date  . Alcohol abuse   . Allergy   . Depression   . Fibromyalgia   . GERD (gastroesophageal reflux disease)   . Hashimoto's thyroiditis   . Hyperlipidemia   . IBS (irritable bowel syndrome)   . Osteopenia   . RLS (restless legs syndrome)   . Seizure disorder [facility])    she is on life long medication per Neuro and can not have driving privileges if  she  stops meds  . Seizures ([place])   . Thyroid disease     Patient Active Problem List   Diagnosis Date Noted  . Panic attacks 11/19/2014  . Pap smear for cervical cancer screening 05/21/2014  . Cough 09/27/2013  . Thoracic back pain 08/18/2011  . Conjunctivitis 07/16/2011  . General medical examination 01/06/2011  . DYSPAREUNIA 02/10/2010  . VAGINITIS, ATROPHIC 02/10/2010  . PILONIDAL CYST 02/10/2010  . PNEUMONIA 01/16/2010  . INCONTINENCE, MIXED, URGE/STRESS 01/16/2010  . CHEST PAIN UNSPECIFIED 10/04/2009  . MALAISE AND FATIGUE 09/10/2009  . DIARRHEA, CHRONIC 09/10/2009  . VISUAL CHANGES 06/10/2009  . GANGLION CYST, WRIST, LEFT 06/10/2009  . POSTMENOPAUSAL STATUS 06/10/2009  . PLANTAR FASCIITIS 12/13/2008  . RESTLESS LEGS SYNDROME 09/05/2008  . Impacted cerumen 07/23/2008  . DEPRESSION 07/04/2008  . OTITIS EXTERNA 07/04/2008  . VIRAL URI 07/04/2008  . SWEATING 08/11/2007  . Hyperlipidemia 08/02/2007  . ALCOHOL ABUSE 08/02/2007  . SINUSITIS- ACUTE-NOS 05/11/2007  . ALLERGIC RHINITIS 05/11/2007  . PAIN IN JOINT, HAND 03/09/2007  . Hypothyroidism 12/15/2006  . HEMORRHOIDS 12/15/2006  . GERD 12/15/2006  . MICROSCOPIC HEMATURIA 12/15/2006  . OSTEOPENIA 12/15/2006  . Seizure disorder ([place]) 12/15/2006    Past Surgical History:  Procedure Laterality Date  . BUNIONECTOMY     Right and Hammertoe  . CESAREAN SECTION  4098,[id number]   x's 2  . ESOPHAGEAL MANOMETRY N/A 12/09/2016   Procedure: ESOPHAGEAL MANOMETRY (EM);  Surgeon: Laurence Spates, MD;  Location: [facility] ENDOSCOPY;  Service: Endoscopy;  Laterality: N/A;  .  FOOT SURGERY     Left--Neuroma, Bunion  . LIPOSUCTION     abdominal  . TONSILLECTOMY     as a child  . WRIST SURGERY     Right wrist---Benign mass     OB History   No obstetric history on file.      Home Medications    Prior to Admission medications   Medication Sig Start Date End Date Taking? Authorizing Provider  Calcium Citrate-Vitamin D (CALCIUM + D  PO) Take 1 tablet by mouth daily.   Yes [provider]  FLUoxetine (PROZAC) 40 MG capsule Take 40 mg by mouth every morning.   Yes [provider]  gabapentin (NEURONTIN) 600 MG tablet Take 1,200 mg by mouth at bedtime.  09/11/16  Yes [provider]  hydrOXYzine (VISTARIL) 25 MG capsule Take 25 mg by mouth every 8 (eight) hours as needed for anxiety.  05/18/14  Yes [provider]  levETIRAcetam (KEPPRA) 1000 MG tablet Take 1,500 mg by mouth 2 (two) times a day. 12/07/18  Yes [provider]  levothyroxine (SYNTHROID) 125 MCG tablet Take 125 mcg by mouth daily before breakfast.   Yes [provider]  pantoprazole (PROTONIX) 40 MG tablet TAKE 1 TABLET BY MOUTH EVERY DAY 10/05/15  Yes Sheliah Hatchabori, Katherine E, MD  pramipexole (MIRAPEX) 1 MG tablet Take 3 mg by mouth at bedtime.    Yes [provider]  simvastatin (ZOCOR) 20 MG tablet TAKE 1 TABLET BY MOUTH EVERY NIGHT AT BEDTIME 10/18/15  Yes Sheliah Hatchabori, Katherine E, MD    Family History Family History  Problem Relation Age of Onset  . Breast cancer Mother   . Hyperlipidemia Mother   . Prostate cancer Father   . Hyperlipidemia Father   . Depression Father   . Cancer Cousin        Colon Cancer  . Diabetes Other        Maternal Grandparent  . Kidney disease Other        Maternal Grandparent  . Stroke Other        Grandparent  . Heart disease Other        Grand Parent    Social History Social History   Tobacco Use  . Smoking status: Former Smoker    Quit date: 07/21/1979    Years since quitting: 39.5  . Smokeless tobacco: Never Used  Substance Use Topics  . Alcohol use: Yes    Comment: 1-2 drinks per night  . Drug use: No     Allergies   Morphine   Review of Systems Review of Systems  Constitutional: Negative for activity change, appetite change, chills, diaphoresis, fatigue, fever and unexpected weight change.  HENT: Negative for congestion and rhinorrhea.   Eyes:  Negative for visual disturbance.  Respiratory: Positive for shortness of breath. Negative for cough, chest tightness and wheezing.   Cardiovascular: Positive for chest pain and palpitations. Negative for leg swelling.  Gastrointestinal: Positive for nausea. Negative for abdominal pain and vomiting.  Endocrine: Negative for cold intolerance and heat intolerance.  Genitourinary: Negative for dysuria.  Musculoskeletal: Negative for back pain and myalgias.  Skin: Negative for rash.  Allergic/Immunologic: Negative for immunocompromised state.  Neurological: Negative for dizziness, syncope, weakness, light-headedness, numbness and headaches.  Hematological: Does not bruise/bleed easily.  Psychiatric/Behavioral: Negative for agitation, behavioral problems and suicidal ideas. The patient is nervous/anxious.     Physical Exam Updated Vital Signs BP (!) 144/75 (BP Location: Left Arm)   Pulse 69  Temp 98.5 F (36.9 C) (Oral)   Resp 20   SpO2 100%   Physical Exam Vitals signs and nursing note reviewed.  Constitutional:      General: She is not in acute distress.    Appearance: She is well-developed. She is not diaphoretic.  HENT:     Head: Normocephalic and atraumatic.     Mouth/Throat:     Mouth: Mucous membranes are moist.     Pharynx: Oropharynx is clear. Uvula midline.  Neck:     Musculoskeletal: Normal range of motion and neck supple.     Vascular: No JVD.  Cardiovascular:     Rate and Rhythm: Normal rate and regular rhythm.     Pulses: Normal pulses.          Radial pulses are 2+ on the right side and 2+ on the left side.       Dorsalis pedis pulses are 2+ on the right side and 2+ on the left side.     Heart sounds: Normal heart sounds. No murmur. No friction rub. No gallop.   Pulmonary:     Effort: Pulmonary effort is normal. No respiratory distress.     Breath sounds: Normal breath sounds. No wheezing, rhonchi or rales.  Chest:     Chest wall: No tenderness.  Abdominal:      Palpations: Abdomen is soft.     Tenderness: There is no abdominal tenderness.  Musculoskeletal: Normal range of motion.     Right lower leg: She exhibits no tenderness. No edema.     Left lower leg: She exhibits no tenderness. No edema.  Skin:    Capillary Refill: Capillary refill takes less than 2 seconds.     Coloration: Skin is not pale.     Findings: No rash.  Neurological:     Mental Status: She is alert.  Psychiatric:        Mood and Affect: Mood is anxious.     ED Treatments / Results  Labs (all labs ordered are listed, but only abnormal results are displayed) Labs Reviewed  BASIC METABOLIC PANEL - Abnormal; Notable for the following components:      Result Value   Potassium 3.2 (*)    CO2 20 (*)    Glucose, Bld 116 (*)    All other components within normal limits  CBC - Abnormal; Notable for the following components:   WBC 11.3 (*)    All other components within normal limits  TROPONIN I (HIGH SENSITIVITY)    EKG EKG Interpretation  Date/Time:  Saturday February 04 2019 11:21:45 EDT Ventricular Rate:  71 PR Interval:    QRS Duration: 89 QT Interval:  453 QTC Calculation: 493 R Axis:   58 Text Interpretation:  Sinus rhythm Borderline prolonged QT interval Confirmed by Bethann BerkshireZammit, Joseph [id number]) on 02/04/2019 12:26:41 PM   Radiology Dg Chest Port 1 View  Result Date: 02/04/2019 CLINICAL DATA:  68 year old with an acute episode of mid chest pain last night that lasted an hour, associated with nausea. EXAM: PORTABLE CHEST 1 VIEW COMPARISON:  02/18/2016 and earlier. FINDINGS: Cardiac silhouette normal in size, unchanged. Thoracic aorta mildly atherosclerotic, unchanged. Hilar and mediastinal contours otherwise unremarkable. Lungs clear. Bronchovascular markings normal. Pulmonary vascularity normal. No visible pleural effusions. No pneumothorax. IMPRESSION: No acute cardiopulmonary disease. Electronically Signed   By: Hulan Saashomas  Lawrence M.D.   On: 02/04/2019 11:01     Procedures Procedures (including critical care time)  Medications Ordered in ED Medications  potassium chloride (K-DUR)  CR tablet 30 mEq (30 mEq Oral Given 02/04/19 1248)     Initial Impression / Assessment and Plan / ED Course  I have reviewed the triage vital signs and the nursing notes.  Pertinent labs & imaging results that were available during my care of the patient were reviewed by me and considered in my medical decision making (see chart for details).  Clinical Course as of Feb 03 1250  Sat Feb 04, 2019  1104 No acute cardiopulmonary disease.  DG Chest Port 1 View [AH]  1104 Leukocytosis noted at 11.3. This appears to be similar to previous values.  WBC(!): 11.3 [AH]  1134 Initial troponin is 3.   Troponin I (High Sensitivity) [AH]  1232 Mild hypokalemia noted at 3.2. Ordered potassium and advised patient to follow up with PCP.  Potassium(!): 3.2 [AH]    Clinical Course User Index [AH] Leretha DykesHernandez, Hakim Minniefield P, PA-C      Patient presents with chest pain, shortness of breath, and cough. Labs, vitals, and imaging reviewed. CXR is negative for acute cardiopulmonary disease. EKG is sinus rhythm with borderline prolonged QT. Troponin level is 3. Patient last had symptoms yesterday and has not had any symptoms today. Do not suspect an additional troponin is required at this time. Heart rate <100, no leg edema/swelling, no current symptoms in the ER, no hx of DVT/PE, PE is less likely. Mild leukocytosis noted at 11.3. Mild hypokalemia noted and potassium was provided. Suspect symptoms are likely due to anxiety based on history, physical exam, imaging, and labs. Patient has been asymptomatic while in the ER. Patient is stable in no acute distress. Discussed return precautions. Advised patient to follow up with PCP. Patient states she understands and agrees with plan.  Findings and plan of care discussed with supervising physician Dr. Estell HarpinZammit who personally evaluated and examined this patient.   Final Clinical Impressions(s) / ED Diagnoses   Final diagnoses:  Nonspecific chest pain    ED Discharge Orders    None       Glade StanfordHernandez, Christia Domke P, PA-C 02/04/19 1252    Bethann BerkshireZammit, Joseph, MD 02/04/19 1531

## 2019-02-09 ENCOUNTER — Other Ambulatory Visit: Payer: Self-pay | Admitting: Obstetrics and Gynecology

## 2019-02-09 DIAGNOSIS — N202 Calculus of kidney with calculus of ureter: Secondary | ICD-10-CM

## 2019-02-21 ENCOUNTER — Ambulatory Visit
Admission: RE | Admit: 2019-02-21 | Discharge: 2019-02-21 | Disposition: A | Discharge status: Home / Self Care | Payer: Medicare Other | Source: Ambulatory Visit | Attending: Obstetrics and Gynecology | Admitting: Obstetrics and Gynecology

## 2019-02-21 DIAGNOSIS — N202 Calculus of kidney with calculus of ureter: Secondary | ICD-10-CM

## 2019-02-21 MED ORDER — IOPAMIDOL (ISOVUE-300) INJECTION 61%
100.0000 mL | Freq: Once | INTRAVENOUS | Status: AC | PRN
  Administered 2019-02-21: 100 mL via INTRAVENOUS

## 2019-09-29 ENCOUNTER — Other Ambulatory Visit (HOSPITAL_COMMUNITY): Payer: Self-pay | Admitting: *Deleted

## 2019-09-29 DIAGNOSIS — R1310 Dysphagia, unspecified: Secondary | ICD-10-CM

## 2019-10-09 ENCOUNTER — Other Ambulatory Visit: Payer: Self-pay

## 2019-10-09 ENCOUNTER — Ambulatory Visit (HOSPITAL_COMMUNITY)
Admission: RE | Admit: 2019-10-09 | Discharge: 2019-10-09 | Disposition: A | Discharge status: Home / Self Care | Payer: Medicare Other | Source: Ambulatory Visit | Attending: Gastroenterology | Admitting: Gastroenterology

## 2019-10-09 DIAGNOSIS — R1310 Dysphagia, unspecified: Secondary | ICD-10-CM | POA: Diagnosis not present

## 2019-10-09 NOTE — Progress Notes (Signed)
Modified Barium Swallow Progress Note  Patient Details  Name: Megan Singleton MRN: [id number] Date of Birth: Mar 06, 1951  Today's Date: 10/09/2019  Modified Barium Swallow completed.  Full report located under Chart Review in the Imaging Section.  Brief recommendations include the following:  Clinical Impression  Pt demonstrates relatively normal swallow function. Despite pressing pt to consume thin liquids in rapid success and a significant amount of solids, followed by more liquids, no significantdifficulty occurred. Pt has instance of mild premature spillage/barium arriving at the pyriforms prior to the swallow, which did not result in penetration and can be WNL for age. Esophageal sweep with solids showed slow movement of solids, though a liquid wash was helpful. Barium table passed easily to the stomach. Pt already follows basic precautions. No new recommendations at this time.    Swallow Evaluation Recommendations       SLP Diet Recommendations: Regular solids;Thin liquid   Liquid Administration via: Cup;Straw       Supervision: Patient able to self feed   Compensations: Slow rate;Small sips/bites;Follow solids with liquid   Postural Changes: Seated upright at 90 degrees           Harlon Ditty, MA CCC-SLP  Acute Rehabilitation Services Pager [phone] Office [phone]  Claudine Mouton 10/09/2019,11:41 AM

## 2020-02-15 ENCOUNTER — Other Ambulatory Visit: Payer: Self-pay

## 2020-02-15 ENCOUNTER — Encounter (HOSPITAL_COMMUNITY): Payer: Self-pay | Admitting: Emergency Medicine

## 2020-02-15 ENCOUNTER — Emergency Department (HOSPITAL_COMMUNITY)
Admission: EM | Admit: 2020-02-15 | Discharge: 2020-02-15 | Disposition: A | Discharge status: Home / Self Care | Payer: Medicare Other | Attending: Emergency Medicine | Admitting: Emergency Medicine

## 2020-02-15 DIAGNOSIS — F419 Anxiety disorder, unspecified: Secondary | ICD-10-CM | POA: Insufficient documentation

## 2020-02-15 DIAGNOSIS — Z5321 Procedure and treatment not carried out due to patient leaving prior to being seen by health care provider: Secondary | ICD-10-CM | POA: Insufficient documentation

## 2020-02-15 NOTE — ED Notes (Signed)
Pt disconnected herself from the monitor and walked out

## 2020-02-15 NOTE — ED Provider Notes (Signed)
I went to evaluate patient and she had eloped from the emergency department and left without being seen.  She will be assessed if she returns the emergency department.   Boluwatife Flight, Canary Brim, MD 02/15/20 1007

## 2020-02-15 NOTE — ED Triage Notes (Addendum)
Per pt, states she is having anxiety-states she is seen by Dr Laverna Peace she has not been able to get in touch with her-states her MD who treats her for restless leg syndrome wont give her anymore meds, states she does not abused them-states he husband monitors her Xanax-states she has thought about "bashing her head in because it might get her the attention she needs"-patient is Product manager "I could just leave if I am taking up space"

## 2020-05-10 ENCOUNTER — Other Ambulatory Visit: Payer: Self-pay

## 2020-05-10 ENCOUNTER — Emergency Department (HOSPITAL_COMMUNITY): Payer: Medicare Other

## 2020-05-10 ENCOUNTER — Encounter (HOSPITAL_COMMUNITY): Payer: Self-pay

## 2020-05-10 ENCOUNTER — Emergency Department (HOSPITAL_COMMUNITY)
Admission: EM | Admit: 2020-05-10 | Discharge: 2020-05-10 | Disposition: A | Discharge status: Home / Self Care | Payer: Medicare Other | Attending: Emergency Medicine | Admitting: Emergency Medicine

## 2020-05-10 DIAGNOSIS — E039 Hypothyroidism, unspecified: Secondary | ICD-10-CM | POA: Diagnosis not present

## 2020-05-10 DIAGNOSIS — Z87891 Personal history of nicotine dependence: Secondary | ICD-10-CM | POA: Diagnosis not present

## 2020-05-10 DIAGNOSIS — Z79899 Other long term (current) drug therapy: Secondary | ICD-10-CM | POA: Diagnosis not present

## 2020-05-10 DIAGNOSIS — R0989 Other specified symptoms and signs involving the circulatory and respiratory systems: Secondary | ICD-10-CM | POA: Diagnosis present

## 2020-05-10 DIAGNOSIS — K209 Esophagitis, unspecified without bleeding: Secondary | ICD-10-CM | POA: Diagnosis not present

## 2020-05-10 DIAGNOSIS — K208 Other esophagitis without bleeding: Secondary | ICD-10-CM

## 2020-05-10 MED ORDER — SUCRALFATE 1 GM/10ML PO SUSP: 1.0000 g | Freq: Three times a day (TID) | ORAL | 0 refills | Status: DC

## 2020-05-10 MED ORDER — ALUM & MAG HYDROXIDE-SIMETH 200-200-20 MG/5ML PO SUSP
30.0000 mL | Freq: Once | ORAL | Status: AC
  Administered 2020-05-10: 30 mL via ORAL
  Filled 2020-05-10: qty 30

## 2020-05-10 MED ORDER — LIDOCAINE VISCOUS HCL 2 % MT SOLN
15.0000 mL | Freq: Once | OROMUCOSAL | Status: AC
  Administered 2020-05-10: 15 mL via ORAL
  Filled 2020-05-10: qty 15

## 2020-05-10 NOTE — ED Provider Notes (Signed)
[facility]-EMERGENCY DEPT Provider Note   CSN: [id number] Arrival date & time: 05/10/20  0051     History Chief Complaint  Patient presents with  . Foreign Body in Throat    Megan Singleton is a 69 y.o. female.  The history is provided by the patient.  Foreign Body Location:  Swallowed Suspected object: benadryl capsule  Pain quality:  Pressure Pain severity:  Mild Timing:  Constant Progression:  Unchanged Chronicity:  New Worsened by:  Nothing Ineffective treatments:  None tried Associated symptoms: no abdominal pain, no difficulty breathing, no drooling, no trouble swallowing, no voice change and no vomiting   Risk factors: no developmental delay   Patient with alcohol abuse and Fibromyalgia swallowed a benadryl capsule and felt like it got stuck and became panicked and came in     Past Medical History:  Diagnosis Date  . Alcohol abuse   . Allergy   . Depression   . Fibromyalgia   . GERD (gastroesophageal reflux disease)   . Hashimoto's thyroiditis   . Hyperlipidemia   . IBS (irritable bowel syndrome)   . Osteopenia   . RLS (restless legs syndrome)   . Seizure disorder [facility])    she is on life long medication per Neuro and can not have driving privileges if she  stops meds  . Seizures ([facility])   . Thyroid disease     Patient Active Problem List   Diagnosis Date Noted  . Panic attacks 11/19/2014  . Pap smear for cervical cancer screening 05/21/2014  . Cough 09/27/2013  . Thoracic back pain 08/18/2011  . Conjunctivitis 07/16/2011  . General medical examination 01/06/2011  . DYSPAREUNIA 02/10/2010  . VAGINITIS, ATROPHIC 02/10/2010  . PILONIDAL CYST 02/10/2010  . PNEUMONIA 01/16/2010  . INCONTINENCE, MIXED, URGE/STRESS 01/16/2010  . CHEST PAIN UNSPECIFIED 10/04/2009  . MALAISE AND FATIGUE 09/10/2009  . DIARRHEA, CHRONIC 09/10/2009  . VISUAL CHANGES 06/10/2009  . GANGLION CYST, WRIST, LEFT 06/10/2009  . POSTMENOPAUSAL STATUS  06/10/2009  . PLANTAR FASCIITIS 12/13/2008  . RESTLESS LEGS SYNDROME 09/05/2008  . Impacted cerumen 07/23/2008  . DEPRESSION 07/04/2008  . OTITIS EXTERNA 07/04/2008  . VIRAL URI 07/04/2008  . SWEATING 08/11/2007  . Hyperlipidemia 08/02/2007  . ALCOHOL ABUSE 08/02/2007  . SINUSITIS- ACUTE-NOS 05/11/2007  . ALLERGIC RHINITIS 05/11/2007  . PAIN IN JOINT, HAND 03/09/2007  . Hypothyroidism 12/15/2006  . HEMORRHOIDS 12/15/2006  . GERD 12/15/2006  . MICROSCOPIC HEMATURIA 12/15/2006  . OSTEOPENIA 12/15/2006  . Seizure disorder ([facility]) 12/15/2006    Past Surgical History:  Procedure Laterality Date  . BUNIONECTOMY     Right and Hammertoe  . CESAREAN SECTION  9518,8416   x's 2  . ESOPHAGEAL MANOMETRY N/A 12/09/2016   Procedure: ESOPHAGEAL MANOMETRY (EM);  Surgeon: Carman Ching, MD;  Location: [facility]OS[facility];  Service: Endoscopy;  Laterality: N/A;  . FOOT SURGERY     Left--Neuroma, Bunion  . LIPOSUCTION     abdominal  . TONSILLECTOMY     as a child  . WRIST SURGERY     Right wrist---Benign mass     OB History   No obstetric history on file.     Family History  Problem Relation Age of Onset  . Breast cancer Mother   . Hyperlipidemia Mother   . Prostate cancer Father   . Hyperlipidemia Father   . Depression Father   . Cancer Cousin        Colon Cancer  . Diabetes Other  Maternal Grandparent  . Kidney disease Other        Maternal Grandparent  . Stroke Other        Grandparent  . Heart disease Other        Grand Parent    Social History   Tobacco Use  . Smoking status: Former Smoker    Quit date: 07/21/1979    Years since quitting: 40.8  . Smokeless tobacco: Never Used  Vaping Use  . Vaping Use: Never used  Substance Use Topics  . Alcohol use: Yes    Comment: 1-2 drinks per night  . Drug use: No    Home Medications Prior to Admission medications   Medication Sig Start Date End Date Taking? Authorizing Provider  Calcium Citrate-Vitamin D (CALCIUM + D  PO) Take 1 tablet by mouth daily.    [provider]  FLUoxetine (PROZAC) 40 MG capsule Take 40 mg by mouth every morning.    [provider]  gabapentin (NEURONTIN) 600 MG tablet Take 1,200 mg by mouth at bedtime.  09/11/16   [provider]  hydrOXYzine (VISTARIL) 25 MG capsule Take 25 mg by mouth every 8 (eight) hours as needed for anxiety.  05/18/14   [provider]  levETIRAcetam (KEPPRA) 1000 MG tablet Take 1,500 mg by mouth 2 (two) times a day. 12/07/18   [provider]  levothyroxine (SYNTHROID) 125 MCG tablet Take 125 mcg by mouth daily before breakfast.    [provider]  pantoprazole (PROTONIX) 40 MG tablet TAKE 1 TABLET BY MOUTH EVERY DAY 10/05/15   Sheliah Hatch, MD  pramipexole (MIRAPEX) 1 MG tablet Take 3 mg by mouth at bedtime.     [provider]  simvastatin (ZOCOR) 20 MG tablet TAKE 1 TABLET BY MOUTH EVERY NIGHT AT BEDTIME 10/18/15   Sheliah Hatch, MD    Allergies    Morphine  Review of Systems   Review of Systems  Constitutional: Negative for fever.  HENT: Negative for drooling, trouble swallowing and voice change.   Eyes: Negative for visual disturbance.  Respiratory: Negative for shortness of breath.   Cardiovascular: Negative for chest pain.  Gastrointestinal: Negative for abdominal pain and vomiting.  Genitourinary: Negative for difficulty urinating.  Musculoskeletal: Negative for arthralgias.  Skin: Negative for wound.  Neurological: Negative for dizziness.  Psychiatric/Behavioral: Negative for agitation.  All other systems reviewed and are negative.   Physical Exam Updated Vital Signs BP (!) 146/81 (BP Location: Left Arm)   Pulse (!) 102   Temp (!) 97 F (36.1 C) (Axillary)   Resp 19   SpO2 100%   Physical Exam Vitals and nursing note reviewed.  Constitutional:      General: She is not in acute distress.    Appearance: Normal appearance.  HENT:     Head: Normocephalic and  atraumatic.     Nose: Nose normal.     Mouth/Throat:     Mouth: Mucous membranes are moist.     Pharynx: Oropharynx is clear.  Eyes:     Conjunctiva/sclera: Conjunctivae normal.     Pupils: Pupils are equal, round, and reactive to light.  Cardiovascular:     Rate and Rhythm: Normal rate and regular rhythm.     Pulses: Normal pulses.     Heart sounds: Normal heart sounds.  Pulmonary:     Effort: Pulmonary effort is normal. No respiratory distress.     Breath sounds: Normal breath sounds. No stridor. No wheezing, rhonchi or rales.  Chest:     Chest wall: No tenderness.  Abdominal:     General: Abdomen is flat. Bowel sounds are normal.     Palpations: Abdomen is soft.     Tenderness: There is no abdominal tenderness. There is no guarding.  Musculoskeletal:        General: Normal range of motion.     Cervical back: Normal range of motion and neck supple.  Skin:    General: Skin is warm and dry.     Capillary Refill: Capillary refill takes less than 2 seconds.  Neurological:     General: No focal deficit present.     Mental Status: She is alert and oriented to person, place, and time.  Psychiatric:        Mood and Affect: Mood is anxious.     ED Results / Procedures / Treatments   Labs (all labs ordered are listed, but only abnormal results are displayed) Labs Reviewed - No data to display  EKG None  Radiology DG Neck Soft Tissue  Result Date: 05/10/2020 CLINICAL DATA:  A pill was stuck in the patient's throat earlier today. Patient feels like the pill has gone down but now throat is sore. EXAM: NECK SOFT TISSUES - 1+ VIEW COMPARISON:  None. FINDINGS: There is no evidence of retropharyngeal soft tissue swelling or epiglottic enlargement. The cervical airway is unremarkable and no radio-opaque foreign body identified. Degenerative changes in the cervical spine. IMPRESSION: No radiopaque foreign bodies identified. Electronically Signed   By: Burman NievesWilliam  Stevens M.D.   On:  05/10/2020 03:04    Procedures Procedures (including critical care time)  Medications Ordered in ED Medications  alum & mag hydroxide-simeth (MAALOX/MYLANTA) 200-200-20 MG/5ML suspension 30 mL (30 mLs Oral Given 05/10/20 0306)    And  lidocaine (XYLOCAINE) 2 % viscous mouth solution 15 mL (15 mLs Oral Given 05/10/20 [id number])    ED Course  I have reviewed the triage vital signs and the nursing notes.  Pertinent labs & imaging results that were available during my care of the patient were reviewed by me and considered in my medical decision making (see chart for details).    Tolerated pos in the ED.  I suspect this is pill esophagitis and that the patient panicked and came in.  A capsule would dissolve over time.  I will start carafate and refer the patient to GI for ongoing symptoms.    Megan Singleton was evaluated in Emergency Department on 05/10/2020 for the symptoms described in the history of present illness. She was evaluated in the context of the global COVID-19 pandemic, which necessitated consideration that the patient might be at risk for infection with the SARS-CoV-2 virus that causes COVID-19. Institutional protocols and algorithms that pertain to the evaluation of patients at risk for COVID-19 are in a state of rapid change based on information released by regulatory bodies including the CDC and federal and state organizations. These policies and algorithms were followed during the patient's care in the ED.  Final Clinical Impression(s) / ED Diagnoses   Return for intractable cough, coughing up blood,fevers >100.4 unrelieved by medication, shortness of breath, intractable vomiting, chest pain, shortness of breath, weakness,numbness, changes in speech, facial asymmetry,abdominal pain, passing out,Inability to tolerate liquids or food, cough, altered mental status or any concerns. No signs of systemic illness or infection. The patient is nontoxic-appearing on exam and  vital signs are within normal limits.   I have reviewed the triage vital signs and the nursing notes.  Pertinent labs &imaging results that were available during my care of the patient were reviewed by me and considered in my medical decision making (see chart for details).After history, exam, and medical workup I feel the patient has beenappropriately medically screened and is safe for discharge home. Pertinent diagnoses were discussed with the patient. Patient was given return precautions.   Damesha Lawler, MD 05/10/20 6440

## 2020-05-10 NOTE — ED Notes (Signed)
Patient given coke to attempt to swallow pill

## 2020-05-10 NOTE — ED Notes (Signed)
Patient tolerating fluids with no difficulty 

## 2020-05-10 NOTE — ED Triage Notes (Signed)
Patient arrived stating that about an hour ago she took a benadryl and it is stuck in her throat. Reports trouble swallowing in the past.

## 2020-05-17 ENCOUNTER — Other Ambulatory Visit: Payer: Self-pay | Admitting: Gastroenterology

## 2020-05-20 ENCOUNTER — Other Ambulatory Visit (HOSPITAL_COMMUNITY)
Admission: RE | Admit: 2020-05-20 | Discharge: 2020-05-20 | Disposition: A | Discharge status: Home / Self Care | Payer: Medicare Other | Source: Ambulatory Visit | Attending: Gastroenterology | Admitting: Gastroenterology

## 2020-05-20 DIAGNOSIS — Z20822 Contact with and (suspected) exposure to covid-19: Secondary | ICD-10-CM | POA: Diagnosis not present

## 2020-05-20 DIAGNOSIS — Z01818 Encounter for other preprocedural examination: Secondary | ICD-10-CM | POA: Insufficient documentation

## 2020-05-20 LAB — SARS CORONAVIRUS 2 (TAT 6-24 HRS): SARS Coronavirus 2: NEGATIVE

## 2020-05-23 ENCOUNTER — Ambulatory Visit (HOSPITAL_COMMUNITY): Payer: Medicare Other | Admitting: Certified Registered Nurse Anesthetist

## 2020-05-23 ENCOUNTER — Other Ambulatory Visit: Payer: Self-pay

## 2020-05-23 ENCOUNTER — Ambulatory Visit (HOSPITAL_COMMUNITY)
Admission: RE | Admit: 2020-05-23 | Discharge: 2020-05-23 | Disposition: A | Discharge status: Home / Self Care | Payer: Medicare Other | Attending: Gastroenterology | Admitting: Gastroenterology

## 2020-05-23 ENCOUNTER — Encounter (HOSPITAL_COMMUNITY)
Admission: RE | Disposition: A | Discharge status: Home / Self Care | Payer: Self-pay | Source: Home / Self Care | Attending: Gastroenterology

## 2020-05-23 ENCOUNTER — Encounter (HOSPITAL_COMMUNITY): Payer: Self-pay | Admitting: Gastroenterology

## 2020-05-23 DIAGNOSIS — K2970 Gastritis, unspecified, without bleeding: Secondary | ICD-10-CM | POA: Diagnosis not present

## 2020-05-23 DIAGNOSIS — K224 Dyskinesia of esophagus: Secondary | ICD-10-CM | POA: Insufficient documentation

## 2020-05-23 DIAGNOSIS — R1310 Dysphagia, unspecified: Secondary | ICD-10-CM | POA: Diagnosis present

## 2020-05-23 DIAGNOSIS — Z87891 Personal history of nicotine dependence: Secondary | ICD-10-CM | POA: Insufficient documentation

## 2020-05-23 HISTORY — PX: SUBMUCOSAL INJECTION: SHX5543

## 2020-05-23 HISTORY — PX: ESOPHAGOGASTRODUODENOSCOPY (EGD) WITH PROPOFOL: SHX5813

## 2020-05-23 SURGERY — ESOPHAGOGASTRODUODENOSCOPY (EGD) WITH PROPOFOL
Anesthesia: Monitor Anesthesia Care

## 2020-05-23 MED ORDER — SODIUM CHLORIDE (PF) 0.9 % IJ SOLN
INTRAMUSCULAR | Status: DC | PRN
  Administered 2020-05-23: 4 mL via SUBMUCOSAL

## 2020-05-23 MED ORDER — PROPOFOL 500 MG/50ML IV EMUL
INTRAVENOUS | Status: AC
  Filled 2020-05-23: qty 150

## 2020-05-23 MED ORDER — ONABOTULINUMTOXINA 100 UNITS IJ SOLR
INTRAMUSCULAR | Status: AC
  Filled 2020-05-23: qty 100

## 2020-05-23 MED ORDER — LIDOCAINE 2% (20 MG/ML) 5 ML SYRINGE
INTRAMUSCULAR | Status: DC | PRN
  Administered 2020-05-23: 80 mg via INTRAVENOUS

## 2020-05-23 MED ORDER — SODIUM CHLORIDE (PF) 0.9 % IJ SOLN
INTRAMUSCULAR | Status: AC
  Filled 2020-05-23: qty 10

## 2020-05-23 MED ORDER — MIDAZOLAM HCL 2 MG/2ML IJ SOLN
INTRAMUSCULAR | Status: AC
  Filled 2020-05-23: qty 2

## 2020-05-23 MED ORDER — SODIUM CHLORIDE 0.9 % IV SOLN: INTRAVENOUS | Status: DC

## 2020-05-23 MED ORDER — LACTATED RINGERS IV SOLN: INTRAVENOUS | Status: DC | PRN

## 2020-05-23 MED ORDER — PROPOFOL 500 MG/50ML IV EMUL
INTRAVENOUS | Status: DC | PRN
  Administered 2020-05-23: 150 ug/kg/min via INTRAVENOUS

## 2020-05-23 MED ORDER — PROPOFOL 10 MG/ML IV BOLUS
INTRAVENOUS | Status: DC | PRN
  Administered 2020-05-23: 40 mg via INTRAVENOUS

## 2020-05-23 MED ORDER — MIDAZOLAM HCL 5 MG/5ML IJ SOLN
INTRAMUSCULAR | Status: DC | PRN
  Administered 2020-05-23: 2 mg via INTRAVENOUS

## 2020-05-23 MED ORDER — PROPOFOL 1000 MG/100ML IV EMUL
INTRAVENOUS | Status: AC
  Filled 2020-05-23: qty 200

## 2020-05-23 SURGICAL SUPPLY — 14 items

## 2020-05-23 NOTE — H&P (Signed)
Primary Care Physician:  Verlon Au, MD Primary Gastroenterologist:  Dr. Levora Angel  Reason for Visit : EGD with Botox injection for dysphagia/motility disorder  HPI: Megan Singleton is a 69 y.o. female was seen in the office recently for acute onset of dysphagia.  Patient with history of long-standing dysphagia. Abnormal motility test in the past. Normal EGD in May 2018.         She was doing fine until 2 and half weeks ago when she had a sensation of choking as well as trouble swallowing. Symptoms improved after several episodes of coughing. She has been having intermittent issue swallowing pills since then. No issues with swallowing food. She has been on soft food for last few days. Was seen in emergency room on May 10, 2020 and was diagnosed with possible pill esophagitis and was started on Carafate with some improvement in symptoms.        Denies any abdominal pain. Her weight is fluctuating. Denies any diarrhea or constipation. Denies any blood in the stool or black stool.  Was started on Cardizem for motility disorder with improvement in symptoms.          Previous GI workup        Multiple EGDs in the past        Barium swallow in July 2020 showed mild esophageal dysmotility and small hiatal hernia. Barium. Was able to pass into stomach without any delay.        Esophageal motility test in May 2018 showed hypertensive LES with failure of normal relaxation of LES. Patient also had hypercontractile contractions. Normal peristalsis. Possible achalasia variant. Trial of Botox injection was recommended.        Colonoscopy in August 2018 showed small tubular adenoma in the rectum, diverticulosis and internal hemorrhoids. Repeat recommended in 5 years.  Past Medical History:  Diagnosis Date  . Alcohol abuse   . Allergy   . Depression   . Fibromyalgia   . GERD (gastroesophageal reflux disease)   . Hashimoto's thyroiditis   . Hyperlipidemia   . IBS (irritable bowel  syndrome)   . Osteopenia   . RLS (restless legs syndrome)   . Seizure disorder [facility])    she is on life long medication per Neuro and can not have driving privileges if she  stops meds  . Seizures (HCC)   . Thyroid disease     Past Surgical History:  Procedure Laterality Date  . BUNIONECTOMY     Right and Hammertoe  . CESAREAN SECTION  3612,[id number]   x's 2  . ESOPHAGEAL MANOMETRY N/A 12/09/2016   Procedure: ESOPHAGEAL MANOMETRY (EM);  Surgeon: Carman Ching, MD;  Location: [facility] ENDOSCOPY;  Service: Endoscopy;  Laterality: N/A;  . FOOT SURGERY     Left--Neuroma, Bunion  . LIPOSUCTION     abdominal  . TONSILLECTOMY     as a child  . WRIST SURGERY     Right wrist---Benign mass    Prior to Admission medications   Medication Sig Start Date End Date Taking? Authorizing Provider  diltiazem (CARDIZEM) 30 MG tablet Take 30 mg by mouth 3 (three) times daily.   Yes [provider]  FLUoxetine (PROZAC) 40 MG capsule Take 40 mg by mouth every morning.   Yes [provider]  gabapentin (NEURONTIN) 250 MG/5ML solution Take 500 mg by mouth 3 (three) times daily. 05/13/20  Yes [provider]  lamoTRIgine (LAMICTAL) 25 MG tablet Take 50 mg by mouth at bedtime.   Yes  [provider]  levETIRAcetam (KEPPRA) 100 MG/ML solution Take 10 mLs by mouth in the morning and at bedtime. 05/10/20  Yes [provider]  levothyroxine (SYNTHROID) 125 MCG tablet Take 125 mcg by mouth daily before breakfast.   Yes [provider]  pantoprazole (PROTONIX) 40 MG tablet TAKE 1 TABLET BY MOUTH EVERY DAY Patient taking differently: Take 40 mg by mouth every morning.  10/05/15  Yes Sheliah Hatch, MD  pramipexole (MIRAPEX) 1 MG tablet Take 3 mg by mouth at bedtime.    Yes [provider]  simvastatin (ZOCOR) 20 MG tablet TAKE 1 TABLET BY MOUTH EVERY NIGHT AT BEDTIME Patient taking differently: Take 20 mg by mouth at bedtime.  10/18/15  Yes Sheliah Hatch, MD  sucralfate (CARAFATE) 1 GM/10ML suspension Take 10 mLs (1 g total) by mouth 4 (four) times daily -  with meals and at bedtime. 05/10/20  Yes Palumbo, April, MD    Scheduled Meds: Continuous Infusions: . sodium chloride     PRN Meds:.  Allergies as of 05/17/2020 - Review Complete 05/17/2020  Allergen Reaction Noted  . Morphine Rash 08/02/2007    Family History  Problem Relation Age of Onset  . Breast cancer Mother   . Hyperlipidemia Mother   . Prostate cancer Father   . Hyperlipidemia Father   . Depression Father   . Cancer Cousin        Colon Cancer  . Diabetes Other        Maternal Grandparent  . Kidney disease Other        Maternal Grandparent  . Stroke Other        Grandparent  . Heart disease Other        Grand Parent    Social History   Socioeconomic History  . Marital status: Married    Spouse name: Not on file  . Number of children: Not on file  . Years of education: Not on file  . Highest education level: Not on file  Occupational History  . Not on file  Tobacco Use  . Smoking status: Former Smoker    Quit date: 07/21/1979    Years since quitting: 40.8  . Smokeless tobacco: Never Used  Vaping Use  . Vaping Use: Never used  Substance and Sexual Activity  . Alcohol use: Yes    Comment: 1-2 drinks per night  . Drug use: No  . Sexual activity: Not on file  Other Topics Concern  . Not on file  Social History Narrative  . Not on file   Social Determinants of Health   Financial Resource Strain:   . Difficulty of Paying Living Expenses: Not on file  Food Insecurity:   . Worried About Programme researcher, broadcasting/film/video in the Last Year: Not on file  . Ran Out of Food in the Last Year: Not on file  Transportation Needs:   . Lack of Transportation (Medical): Not on file  . Lack of Transportation (Non-Medical): Not on file  Physical Activity:   . Days of Exercise per Week: Not on file  . Minutes of Exercise per Session: Not on file  Stress:   . Feeling of  Stress : Not on file  Social Connections:   . Frequency of Communication with Friends and Family: Not on file  . Frequency of Social Gatherings with Friends and Family: Not on file  . Attends Religious Services: Not on file  . Active Member of Clubs or Organizations: Not on file  .  Attends Banker Meetings: Not on file  . Marital Status: Not on file  Intimate Partner Violence:   . Fear of Current or Ex-Partner: Not on file  . Emotionally Abused: Not on file  . Physically Abused: Not on file  . Sexually Abused: Not on file     Physical Exam: Vital signs: Vitals:   05/23/20 0815  BP: (!) 147/60  Pulse: 60  Resp: 13  Temp: 98.3 F (36.8 C)  SpO2: 96%     General:   Alert,  Well-developed, well-nourished, pleasant and cooperative in NAD Lungs:  Clear throughout to auscultation.   No wheezes, crackles, or rhonchi. No acute distress. Heart:  Regular rate and rhythm; no murmurs, clicks, rubs,  or gallops. Abdomen: Soft, nontender, nondistended, bowel sounds present Rectal:  Deferred  GI:  Lab Results: No results for input(s): WBC, HGB, HCT, PLT in the last 72 hours. BMET No results for input(s): NA, K, CL, CO2, GLUCOSE, BUN, CREATININE, CALCIUM in the last 72 hours. LFT No results for input(s): PROT, ALBUMIN, AST, ALT, ALKPHOS, BILITOT, BILIDIR, IBILI in the last 72 hours. PT/INR No results for input(s): LABPROT, INR in the last 72 hours.   Studies/Results: No results found.  Impression/Plan: -Esophageal dysphagia.  Improving with Carafate and Cardizem -Motility disorder.   Esophageal motility test in May 2018 showed hypertensive LES with failure of normal relaxation of LES. Patient also had hypercontractile contractions. Normal peristalsis. Possible achalasia variant. Trial of Botox injection was recommended.   Recommendations ------------------------ -Proceed with EGD with Botox injection  Risks (bleeding, infection, bowel perforation that could  require surgery, sedation-related changes in cardiopulmonary systems), benefits (identification and possible treatment of source of symptoms, exclusion of certain causes of symptoms), and alternatives (watchful waiting, radiographic imaging studies, empiric medical treatment)  were explained to patient in detail and patient wishes to proceed.   LOS: 0 days   Kathi Der  MD, FACP 05/23/2020, 8:33 AM  Contact #  [phone]

## 2020-05-23 NOTE — Anesthesia Postprocedure Evaluation (Signed)
Anesthesia Post Note  Patient: Megan Singleton  Procedure(s) Performed: ESOPHAGOGASTRODUODENOSCOPY (EGD) WITH PROPOFOL with Botox (N/A )     Patient location during evaluation: PACU Anesthesia Type: MAC Level of consciousness: awake and alert Pain management: pain level controlled Vital Signs Assessment: post-procedure vital signs reviewed and stable Respiratory status: spontaneous breathing, nonlabored ventilation, respiratory function stable and patient connected to nasal cannula oxygen Cardiovascular status: stable and blood pressure returned to baseline Postop Assessment: no apparent nausea or vomiting Anesthetic complications: no   No complications documented.  Last Vitals:  Vitals:   05/23/20 0920 05/23/20 0930  BP: (!) 158/63 (!) 156/66  Pulse: (!) 52 (!) 52  Resp: 18 17  Temp:    SpO2: 94% 96%    Last Pain:  Vitals:   05/23/20 0930  TempSrc:   PainSc: 0-No pain                 Ananias Kolander DAVID

## 2020-05-23 NOTE — Discharge Instructions (Signed)

## 2020-05-23 NOTE — Op Note (Signed)
[facility] Patient Name: Megan BurkeGeorgeanne Schroeter Procedure Date: 05/23/2020 MRN: [id number] Attending MD: Kathi DerParag Tilly Pernice , MD Date of Birth: 06/30/1951 CSN: [id number] Age: 69 Admit Type: Inpatient Procedure:                Upper GI endoscopy Indications:              Therapeutic procedure, Dysphagia Providers:                Kathi DerParag Abad Manard, MD, Dwain SarnaPatricia Ford, RN, Sunday CornFaustina                            Mbumina, Technician Referring MD:              Medicines:                Sedation Administered by an Anesthesia Professional Complications:            No immediate complications. Estimated Blood Loss:     Estimated blood loss was minimal. Procedure:                Pre-Anesthesia Assessment:                           - Prior to the procedure, a History and Physical                            was performed, and patient medications and                            allergies were reviewed. The patient's tolerance of                            previous anesthesia was also reviewed. The risks                            and benefits of the procedure and the sedation                            options and risks were discussed with the patient.                            All questions were answered, and informed consent                            was obtained. Prior Anticoagulants: The patient has                            taken no previous anticoagulant or antiplatelet                            agents. ASA Grade Assessment: III - A patient with                            severe systemic disease. After reviewing the risks  and benefits, the patient was deemed in                            satisfactory condition to undergo the procedure.                           After obtaining informed consent, the endoscope was                            passed under direct vision. Throughout the                            procedure, the patient's blood pressure, pulse,  and                            oxygen saturations were monitored continuously. The                            GIF-H190 ([id number] Olympus gastroscope was                            introduced through the mouth, and advanced to the                            second part of duodenum. The upper GI endoscopy was                            accomplished without difficulty. The patient                            tolerated the procedure well. Scope In: Scope Out: 8:45:56 AM Findings:      The Z-line was regular and was found 37 cm from the incisors.      Abnormal motility was noted in the distal esophagus. The distal       esophagus/lower esophageal sphincter is spastic, but gives up passage to       the endoscope. Area was successfully injected with 100 units botulinum       toxin.      Scattered mild inflammation was found in the prepyloric region of the       stomach.      The cardia and gastric fundus were normal on retroflexion.      The duodenal bulb, first portion of the duodenum and second portion of       the duodenum were normal. Impression:               - Z-line regular, 37 cm from the incisors.                           - Abnormal esophageal motility, suspicious for                            achalasia. Injected with botulinum toxin.                           - Gastritis.                           -  Normal duodenal bulb, first portion of the                            duodenum and second portion of the duodenum.                           - No specimens collected. Moderate Sedation:      Moderate (conscious) sedation was personally administered by an       anesthesia professional. The following parameters were monitored: oxygen       saturation, heart rate, blood pressure, and response to care. Recommendation:           - Patient has a contact number available for                            emergencies. The signs and symptoms of potential                            delayed  complications were discussed with the                            patient. Return to normal activities tomorrow.                            Written discharge instructions were provided to the                            patient.                           - Soft diet.                           - Continue present medications.                           - Return to my office as previously scheduled. Procedure Code(s):        --- Professional ---                           [phone], Esophagogastroduodenoscopy, flexible,                            transoral; with directed submucosal injection(s),                            any substance Diagnosis Code(s):        --- Professional ---                           K22.4, Dyskinesia of esophagus                           K29.70, Gastritis, unspecified, without bleeding                           R13.10, Dysphagia, unspecified CPT copyright 2019 American [employer].  All rights reserved. The codes documented in this report are preliminary and upon coder review may  be revised to meet current compliance requirements. Kathi Der, MD Kathi Der, MD 05/23/2020 8:55:48 AM Number of Addenda: 0

## 2020-05-23 NOTE — Anesthesia Preprocedure Evaluation (Signed)
Anesthesia Evaluation  Patient identified by MRN, date of birth, ID band Patient awake    Reviewed: Allergy & Precautions, NPO status , Patient's Chart, lab work & pertinent test results  Airway Mallampati: I  TM Distance: >3 FB Neck ROM: Full    Dental   Pulmonary former smoker,    Pulmonary exam normal        Cardiovascular Normal cardiovascular exam     Neuro/Psych Anxiety Depression    GI/Hepatic GERD  Medicated and Controlled,  Endo/Other    Renal/GU      Musculoskeletal   Abdominal   Peds  Hematology   Anesthesia Other Findings   Reproductive/Obstetrics                             Anesthesia Physical Anesthesia Plan  ASA: III  Anesthesia Plan: MAC   Post-op Pain Management:    Induction: Intravenous  PONV Risk Score and Plan: 2  Airway Management Planned: Nasal Cannula  Additional Equipment:   Intra-op Plan:   Post-operative Plan:   Informed Consent: I have reviewed the patients History and Physical, chart, labs and discussed the procedure including the risks, benefits and alternatives for the proposed anesthesia with the patient or authorized representative who has indicated his/her understanding and acceptance.       Plan Discussed with: CRNA and Surgeon  Anesthesia Plan Comments:         Anesthesia Quick Evaluation

## 2020-05-23 NOTE — Transfer of Care (Signed)
Immediate Anesthesia Transfer of Care Note  Patient: Megan Singleton  Procedure(s) Performed: ESOPHAGOGASTRODUODENOSCOPY (EGD) WITH PROPOFOL with Botox (N/A )  Patient Location: Endoscopy Unit  Anesthesia Type:MAC  Level of Consciousness: awake, alert , oriented and patient cooperative  Airway & Oxygen Therapy: Patient Spontanous Breathing and Patient connected to face mask oxygen  Post-op Assessment: Report given to RN, Post -op Vital signs reviewed and stable and Patient moving all extremities  Post vital signs: Reviewed and stable  Last Vitals:  Vitals Value Taken Time  BP 128/61 05/23/20 0856  Temp    Pulse    Resp 19 05/23/20 0856  SpO2    Vitals shown include unvalidated device data.  Last Pain:  Vitals:   05/23/20 0815  TempSrc: Oral  PainSc: 0-No pain         Complications: No complications documented.

## 2020-05-24 ENCOUNTER — Encounter (HOSPITAL_COMMUNITY): Payer: Self-pay | Admitting: Gastroenterology

## 2020-05-26 ENCOUNTER — Other Ambulatory Visit: Payer: Self-pay

## 2020-05-26 ENCOUNTER — Emergency Department (HOSPITAL_COMMUNITY)
Admission: EM | Admit: 2020-05-26 | Discharge: 2020-05-26 | Disposition: A | Discharge status: Home / Self Care | Payer: Medicare Other | Attending: Emergency Medicine | Admitting: Emergency Medicine

## 2020-05-26 DIAGNOSIS — X58XXXA Exposure to other specified factors, initial encounter: Secondary | ICD-10-CM | POA: Diagnosis not present

## 2020-05-26 DIAGNOSIS — Z87891 Personal history of nicotine dependence: Secondary | ICD-10-CM | POA: Diagnosis not present

## 2020-05-26 DIAGNOSIS — Z20822 Contact with and (suspected) exposure to covid-19: Secondary | ICD-10-CM | POA: Diagnosis not present

## 2020-05-26 DIAGNOSIS — T18108A Unspecified foreign body in esophagus causing other injury, initial encounter: Secondary | ICD-10-CM | POA: Diagnosis not present

## 2020-05-26 LAB — RESPIRATORY PANEL BY RT PCR (FLU A&B, COVID)
Influenza A by PCR: NEGATIVE
Influenza B by PCR: NEGATIVE
SARS Coronavirus 2 by RT PCR: NEGATIVE

## 2020-05-26 MED ORDER — GLUCAGON HCL RDNA (DIAGNOSTIC) 1 MG IJ SOLR
1.0000 mg | Freq: Once | INTRAMUSCULAR | Status: AC
  Administered 2020-05-26: 1 mg via INTRAVENOUS
  Filled 2020-05-26: qty 1

## 2020-05-26 NOTE — ED Provider Notes (Signed)
[facility]-EMERGENCY DEPT Provider Note   CSN: [id number] Arrival date & time: 05/26/20  1850     History Chief Complaint  Patient presents with  . Foreign Body in Throat    Efrain SellaGeorgeanne J Sowder is a 69 y.o. female.  Patient is a 69 year old female who presents with pill stuck in her esophagus.  She said that she has had some ongoing trouble with things getting stuck in her esophagus and had a procedure last week which involved an EGD and Botox injections.  She said that tonight she was swallowing some pills and feels like it got stuck in her throat.  She is able to swallow water but it hurts in the center of her chest whenever she swallows anything.  She feels like it stuck in her esophagus.  She has no vomiting.        Past Medical History:  Diagnosis Date  . Alcohol abuse   . Allergy   . Depression   . Fibromyalgia   . GERD (gastroesophageal reflux disease)   . Hashimoto's thyroiditis   . Hyperlipidemia   . IBS (irritable bowel syndrome)   . Osteopenia   . RLS (restless legs syndrome)   . Seizure disorder [facility](HCC)    she is on life long medication per Neuro and can not have driving privileges if she  stops meds  . Seizures (HCC)   . Thyroid disease     Patient Active Problem List   Diagnosis Date Noted  . Panic attacks 11/19/2014  . Pap smear for cervical cancer screening 05/21/2014  . Cough 09/27/2013  . Thoracic back pain 08/18/2011  . Conjunctivitis 07/16/2011  . General medical examination 01/06/2011  . DYSPAREUNIA 02/10/2010  . VAGINITIS, ATROPHIC 02/10/2010  . PILONIDAL CYST 02/10/2010  . PNEUMONIA 01/16/2010  . INCONTINENCE, MIXED, URGE/STRESS 01/16/2010  . CHEST PAIN UNSPECIFIED 10/04/2009  . MALAISE AND FATIGUE 09/10/2009  . DIARRHEA, CHRONIC 09/10/2009  . VISUAL CHANGES 06/10/2009  . GANGLION CYST, WRIST, LEFT 06/10/2009  . POSTMENOPAUSAL STATUS 06/10/2009  . PLANTAR FASCIITIS 12/13/2008  . RESTLESS LEGS SYNDROME 09/05/2008    . Impacted cerumen 07/23/2008  . DEPRESSION 07/04/2008  . OTITIS EXTERNA 07/04/2008  . VIRAL URI 07/04/2008  . SWEATING 08/11/2007  . Hyperlipidemia 08/02/2007  . ALCOHOL ABUSE 08/02/2007  . SINUSITIS- ACUTE-NOS 05/11/2007  . ALLERGIC RHINITIS 05/11/2007  . PAIN IN JOINT, HAND 03/09/2007  . Hypothyroidism 12/15/2006  . HEMORRHOIDS 12/15/2006  . GERD 12/15/2006  . MICROSCOPIC HEMATURIA 12/15/2006  . OSTEOPENIA 12/15/2006  . Seizure disorder (HCC) 12/15/2006    Past Surgical History:  Procedure Laterality Date  . BUNIONECTOMY     Right and Hammertoe  . CESAREAN SECTION  [id number]   x's 2  . ESOPHAGEAL MANOMETRY N/A 12/09/2016   Procedure: ESOPHAGEAL MANOMETRY (EM);  Surgeon: Carman ChingEdwards, James, MD;  Location: [facility];  Service: Endoscopy;  Laterality: N/A;  . ESOPHAGOGASTRODUODENOSCOPY (EGD) WITH PROPOFOL N/A 05/23/2020   Procedure: ESOPHAGOGASTRODUODENOSCOPY (EGD) WITH PROPOFOL with Botox;  Surgeon: Kathi DerBrahmbhatt, Parag, MD;  [facility];  Service: Gastroenterology;  Laterality: N/A;  . FOOT SURGERY     Left--Neuroma, Bunion  . LIPOSUCTION     abdominal  . SUBMUCOSAL INJECTION  05/23/2020   Procedure: SUBMUCOSAL INJECTION;  Surgeon: Kathi DerBrahmbhatt, Parag, MD;  Location: [facility];  Service: Gastroenterology;;  . TONSILLECTOMY     as a child  . WRIST SURGERY     Right wrist---Benign mass     OB History   No obstetric history on  file.     Family History  Problem Relation Age of Onset  . Breast cancer Mother   . Hyperlipidemia Mother   . Prostate cancer Father   . Hyperlipidemia Father   . Depression Father   . Cancer Cousin        Colon Cancer  . Diabetes Other        Maternal Grandparent  . Kidney disease Other        Maternal Grandparent  . Stroke Other        Grandparent  . Heart disease Other        Grand Parent    Social History   Tobacco Use  . Smoking status: Former Smoker    Quit date: 07/21/1979    Years since quitting: 40.8  .  Smokeless tobacco: Never Used  Vaping Use  . Vaping Use: Never used  Substance Use Topics  . Alcohol use: Yes    Comment: 1-2 drinks per night  . Drug use: No    Home Medications Prior to Admission medications   Medication Sig Start Date End Date Taking? Authorizing Provider  diltiazem (CARDIZEM) 30 MG tablet Take 30 mg by mouth 3 (three) times daily.    [provider]  FLUoxetine (PROZAC) 40 MG capsule Take 40 mg by mouth every morning.    [provider]  gabapentin (NEURONTIN) 250 MG/5ML solution Take 500 mg by mouth 3 (three) times daily. 05/13/20   [provider]  lamoTRIgine (LAMICTAL) 25 MG tablet Take 50 mg by mouth at bedtime.    [provider]  levETIRAcetam (KEPPRA) 100 MG/ML solution Take 10 mLs by mouth in the morning and at bedtime. 05/10/20   [provider]  levothyroxine (SYNTHROID) 125 MCG tablet Take 125 mcg by mouth daily before breakfast.    [provider]  pantoprazole (PROTONIX) 40 MG tablet TAKE 1 TABLET BY MOUTH EVERY DAY Patient taking differently: Take 40 mg by mouth every morning.  10/05/15   Sheliah Hatch, MD  pramipexole (MIRAPEX) 1 MG tablet Take 3 mg by mouth at bedtime.     [provider]  simvastatin (ZOCOR) 20 MG tablet TAKE 1 TABLET BY MOUTH EVERY NIGHT AT BEDTIME Patient taking differently: Take 20 mg by mouth at bedtime.  10/18/15   Sheliah Hatch, MD  sucralfate (CARAFATE) 1 GM/10ML suspension Take 10 mLs (1 g total) by mouth 4 (four) times daily -  with meals and at bedtime. 05/10/20   Palumbo, April, MD    Allergies    Morphine  Review of Systems   Review of Systems  Constitutional: Negative for chills, diaphoresis, fatigue and fever.  HENT: Negative for congestion, rhinorrhea and sneezing.   Eyes: Negative.   Respiratory: Negative for cough, chest tightness and shortness of breath.   Cardiovascular: Negative for chest pain and leg swelling.  Gastrointestinal:  Negative for abdominal pain, blood in stool, diarrhea, nausea and vomiting.       Pain and burning on swallowing  Genitourinary: Negative for difficulty urinating, flank pain, frequency and hematuria.  Musculoskeletal: Negative for arthralgias and back pain.  Skin: Negative for rash.  Neurological: Negative for dizziness, speech difficulty, weakness, numbness and headaches.    Physical Exam Updated Vital Signs BP (!) 157/62 (BP Location: Right Arm)   Pulse 72   Temp 98.5 F (36.9 C) (Oral)   Resp 16   Ht 5\' 4"  (1.626 m)   Wt 62.1 kg   SpO2 94%   BMI  23.52 kg/m   Physical Exam Constitutional:      Appearance: She is well-developed.  HENT:     Head: Normocephalic and atraumatic.  Eyes:     Pupils: Pupils are equal, round, and reactive to light.  Cardiovascular:     Rate and Rhythm: Normal rate and regular rhythm.     Heart sounds: Normal heart sounds.  Pulmonary:     Effort: Pulmonary effort is normal. No respiratory distress.     Breath sounds: Normal breath sounds. No wheezing or rales.  Chest:     Chest wall: No tenderness.  Abdominal:     General: Bowel sounds are normal.     Palpations: Abdomen is soft.     Tenderness: There is no abdominal tenderness. There is no guarding or rebound.  Musculoskeletal:        General: Normal range of motion.     Cervical back: Normal range of motion and neck supple.  Lymphadenopathy:     Cervical: No cervical adenopathy.  Skin:    General: Skin is warm and dry.     Findings: No rash.  Neurological:     Mental Status: She is alert and oriented to person, place, and time.     ED Results / Procedures / Treatments   Labs (all labs ordered are listed, but only abnormal results are displayed) Labs Reviewed  RESPIRATORY PANEL BY RT PCR (FLU A&B, COVID)    EKG EKG Interpretation  Date/Time:  Sunday May 26 2020 21:45:47 EST Ventricular Rate:  85 PR Interval:    QRS Duration: 83 QT Interval:  417 QTC  Calculation: 496 R Axis:   71 Text Interpretation: Sinus rhythm Anteroseptal infarct, age indeterminate since last tracing no significant change Confirmed by Rolan Bucco [phone]) on 05/26/2020 10:00:42 PM   Radiology No results found.  Procedures Procedures (including critical care time)  Medications Ordered in ED Medications  glucagon (human recombinant) (GLUCAGEN) injection 1 mg (1 mg Intravenous Given 05/26/20 2136)    ED Course  I have reviewed the triage vital signs and the nursing notes.  Pertinent labs & imaging results that were available during my care of the patient were reviewed by me and considered in my medical decision making (see chart for details).    MDM Rules/Calculators/A&P                          Patient is a 69 year old female who presents with what feels to be pill stuck in her esophagus.  She has had similar symptoms in the past.  Dr. Bosie Clos with [facility] gastroenterology had called prior to her arrival and recommended that we give her glucagon and if it did not resolve with that to call the on-call gastroenterologist.  She was given glucagon.  The pills seem to pass with that.  She got a little anxious after the glucagon and felt short of breath for a brief period of time.  She did not have any visible angioedema or swelling.  This quickly resolved with some observation.  EKG was performed which shows no ischemic changes.  She is feeling better and is able to swallow liquids without difficulty.  She was discharged home in good condition.  She was advised to call her gastroenterologist tomorrow.  Return precautions were given. Final Clinical Impression(s) / ED Diagnoses Final diagnoses:  Foreign body in esophagus, initial encounter    Rx / DC Orders ED Discharge Orders    None  Rolan Bucco, MD 05/26/20 2243

## 2020-05-26 NOTE — ED Notes (Signed)
Patient tolerating PO

## 2020-05-26 NOTE — Discharge Instructions (Addendum)
Eat liquids or soft foods.  Contact your gastroenterologist tomorrow for follow-up.  Return here as needed if you have any worsening symptoms.

## 2020-05-26 NOTE — ED Triage Notes (Signed)
Patient reports she has low motility in her esophagus and has pills stuck in her throat. Patient reports this has happened in the past

## 2020-06-18 ENCOUNTER — Emergency Department (HOSPITAL_COMMUNITY): Payer: Medicare Other

## 2020-06-18 ENCOUNTER — Emergency Department (HOSPITAL_COMMUNITY)
Admission: EM | Admit: 2020-06-18 | Discharge: 2020-06-18 | Disposition: A | Discharge status: Home / Self Care | Payer: Medicare Other | Attending: Emergency Medicine | Admitting: Emergency Medicine

## 2020-06-18 ENCOUNTER — Other Ambulatory Visit: Payer: Self-pay

## 2020-06-18 ENCOUNTER — Encounter (HOSPITAL_COMMUNITY): Payer: Self-pay

## 2020-06-18 DIAGNOSIS — Z5321 Procedure and treatment not carried out due to patient leaving prior to being seen by health care provider: Secondary | ICD-10-CM | POA: Insufficient documentation

## 2020-06-18 DIAGNOSIS — R0602 Shortness of breath: Secondary | ICD-10-CM | POA: Diagnosis not present

## 2020-06-18 DIAGNOSIS — R0781 Pleurodynia: Secondary | ICD-10-CM | POA: Insufficient documentation

## 2020-06-18 LAB — TROPONIN I (HIGH SENSITIVITY): Troponin I (High Sensitivity): 3 ng/L (ref <18)

## 2020-06-18 LAB — CBC
HCT: 42.3 % (ref 36.0–46.0)
Hemoglobin: 14.7 g/dL (ref 12.0–15.0)
MCH: 31.5 pg (ref 26.0–34.0)
MCHC: 34.8 g/dL (ref 30.0–36.0)
MCV: 90.8 fL (ref 80.0–100.0)
Platelets: 304 10*3/uL (ref 150–400)
RBC: 4.66 MIL/uL (ref 3.87–5.11)
RDW: 12.4 % (ref 11.5–15.5)
WBC: 10.5 10*3/uL (ref 4.0–10.5)
nRBC: 0 % (ref 0.0–0.2)

## 2020-06-18 LAB — BASIC METABOLIC PANEL
Anion gap: 13 (ref 5–15)
BUN: 13 mg/dL (ref 8–23)
CO2: 24 mmol/L (ref 22–32)
Calcium: 9.8 mg/dL (ref 8.9–10.3)
Chloride: 105 mmol/L (ref 98–111)
Creatinine, Ser: 0.51 mg/dL (ref 0.44–1.00)
GFR, Estimated: >60 mL/min (ref >60)
Glucose, Bld: 118 mg/dL — ABNORMAL HIGH (ref 70–99)
Potassium: 3.6 mmol/L (ref 3.5–5.1)
Sodium: 142 mmol/L (ref 135–145)

## 2020-06-18 NOTE — ED Notes (Signed)
Patient states she did not want to wait any longer.

## 2020-06-18 NOTE — ED Notes (Signed)
Patient now states she has had intermittent chest pain.

## 2020-06-18 NOTE — ED Triage Notes (Signed)
Patient arrived stating she woke up tonight with complaints of bilateral rib pain. Patient states she feels short of breath. No known injury.

## 2020-06-18 NOTE — ED Notes (Signed)
Chart opened due to pt request, looking for discharge paperwork.

## 2020-07-13 ENCOUNTER — Encounter (HOSPITAL_BASED_OUTPATIENT_CLINIC_OR_DEPARTMENT_OTHER): Payer: Self-pay | Admitting: Emergency Medicine

## 2020-07-13 ENCOUNTER — Emergency Department (HOSPITAL_BASED_OUTPATIENT_CLINIC_OR_DEPARTMENT_OTHER)
Admission: EM | Admit: 2020-07-13 | Discharge: 2020-07-13 | Disposition: A | Discharge status: Home / Self Care | Payer: Medicare Other | Attending: Emergency Medicine | Admitting: Emergency Medicine

## 2020-07-13 ENCOUNTER — Other Ambulatory Visit: Payer: Self-pay

## 2020-07-13 DIAGNOSIS — R110 Nausea: Secondary | ICD-10-CM | POA: Diagnosis not present

## 2020-07-13 DIAGNOSIS — R0602 Shortness of breath: Secondary | ICD-10-CM | POA: Diagnosis not present

## 2020-07-13 DIAGNOSIS — Z87891 Personal history of nicotine dependence: Secondary | ICD-10-CM | POA: Insufficient documentation

## 2020-07-13 DIAGNOSIS — K224 Dyskinesia of esophagus: Secondary | ICD-10-CM

## 2020-07-13 DIAGNOSIS — R072 Precordial pain: Secondary | ICD-10-CM | POA: Diagnosis present

## 2020-07-13 DIAGNOSIS — E876 Hypokalemia: Secondary | ICD-10-CM | POA: Diagnosis not present

## 2020-07-13 HISTORY — DX: Abnormal electrocardiogram (ECG) (EKG): R94.31

## 2020-07-13 LAB — TROPONIN I (HIGH SENSITIVITY)
Troponin I (High Sensitivity): 4 ng/L (ref <18)
Troponin I (High Sensitivity): 4 ng/L (ref <18)

## 2020-07-13 LAB — CBC WITH DIFFERENTIAL/PLATELET
Abs Immature Granulocytes: 0.03 10*3/uL (ref 0.00–0.07)
Basophils Absolute: 0 10*3/uL (ref 0.0–0.1)
Basophils Relative: 0 %
Eosinophils Absolute: 0 10*3/uL (ref 0.0–0.5)
Eosinophils Relative: 0 %
HCT: 41.5 % (ref 36.0–46.0)
Hemoglobin: 14.6 g/dL (ref 12.0–15.0)
Immature Granulocytes: 0 %
Lymphocytes Relative: 34 %
Lymphs Abs: 3.5 10*3/uL (ref 0.7–4.0)
MCH: 31.7 pg (ref 26.0–34.0)
MCHC: 35.2 g/dL (ref 30.0–36.0)
MCV: 90 fL (ref 80.0–100.0)
Monocytes Absolute: 0.5 10*3/uL (ref 0.1–1.0)
Monocytes Relative: 5 %
Neutro Abs: 6.2 10*3/uL (ref 1.7–7.7)
Neutrophils Relative %: 61 %
Platelets: 274 10*3/uL (ref 150–400)
RBC: 4.61 MIL/uL (ref 3.87–5.11)
RDW: 12.3 % (ref 11.5–15.5)
WBC: 10.3 10*3/uL (ref 4.0–10.5)
nRBC: 0 % (ref 0.0–0.2)

## 2020-07-13 LAB — BASIC METABOLIC PANEL
Anion gap: 12 (ref 5–15)
BUN: 10 mg/dL (ref 8–23)
CO2: 25 mmol/L (ref 22–32)
Calcium: 9 mg/dL (ref 8.9–10.3)
Chloride: 102 mmol/L (ref 98–111)
Creatinine, Ser: 0.59 mg/dL (ref 0.44–1.00)
GFR, Estimated: >60 mL/min (ref >60)
Glucose, Bld: 114 mg/dL — ABNORMAL HIGH (ref 70–99)
Potassium: 2.6 mmol/L — CL (ref 3.5–5.1)
Sodium: 139 mmol/L (ref 135–145)

## 2020-07-13 MED ORDER — POTASSIUM CHLORIDE CRYS ER 20 MEQ PO TBCR
40.0000 meq | EXTENDED_RELEASE_TABLET | Freq: Once | ORAL | Status: AC
  Administered 2020-07-13: 04:00:00 40 meq via ORAL
  Filled 2020-07-13: qty 2

## 2020-07-13 MED ORDER — POTASSIUM CHLORIDE 10 MEQ/100ML IV SOLN
10.0000 meq | INTRAVENOUS | Status: AC
  Administered 2020-07-13 (×3): 10 meq via INTRAVENOUS
  Filled 2020-07-13 (×2): qty 100

## 2020-07-13 MED ORDER — POTASSIUM CHLORIDE 20 MEQ/15ML (10%) PO SOLN
20.0000 meq | Freq: Two times a day (BID) | ORAL | 0 refills | Status: DC
Start: 2020-07-13 — End: 2021-03-29

## 2020-07-13 MED ORDER — PANTOPRAZOLE SODIUM 40 MG IV SOLR
40.0000 mg | Freq: Once | INTRAVENOUS | Status: AC
  Administered 2020-07-13: 02:00:00 40 mg via INTRAVENOUS
  Filled 2020-07-13: qty 40

## 2020-07-13 MED ORDER — GLUCAGON HCL RDNA (DIAGNOSTIC) 1 MG IJ SOLR
1.0000 mg | Freq: Once | INTRAMUSCULAR | Status: AC
  Administered 2020-07-13: 02:00:00 1 mg via INTRAVENOUS
  Filled 2020-07-13: qty 1

## 2020-07-13 MED ORDER — LORAZEPAM 2 MG/ML IJ SOLN
1.0000 mg | Freq: Once | INTRAMUSCULAR | Status: AC
  Administered 2020-07-13: 02:00:00 1 mg via INTRAVENOUS
  Filled 2020-07-13: qty 1

## 2020-07-13 NOTE — ED Notes (Signed)
Date and time results received: 07/13/20 3:23 AM (use smartphrase ".now" to insert current time)  Test: potassium Critical Value: 2.6  Name of Provider Notified: [email or web address]  Orders Received? Or Actions Taken?: see new orders

## 2020-07-13 NOTE — ED Provider Notes (Signed)
[facility] Provider Note: Lowella Dell, MD, FACEP  CSN: [id number] MRN: [id number] ARRIVAL: 07/13/20 at 0157 ROOM: [facility]03/[facility]   CHIEF COMPLAINT  Chest Pain   HISTORY OF PRESENT ILLNESS  07/13/20 2:09 AM Megan Singleton Megan Singleton is a 69 y.o. female with longstanding GERD and esophageal dysmotility.  She has recurrent esophageal pain.  She underwent an EGD on 05/23/2020 with Botox administration.  She is here with chest pain that began about 2 hours ago. The pain is located in the lower substernal region.  She characterizes it as like previous esophageal pain.  She rates it as a 10 out of 10.  She took Cardizem prior to arrival without relief of the pain; it usually is more effective.  She has never taken nitroglycerin for this pain.  She has had some intermittent shortness of breath and nausea with this.  She took Tylenol with some relief.   Past Medical History:  Diagnosis Date  . Alcohol abuse   . Allergy   . Depression   . Fibromyalgia   . GERD (gastroesophageal reflux disease)   . Hashimoto's thyroiditis   . Hyperlipidemia   . IBS (irritable bowel syndrome)   . Osteopenia   . Prolonged QT interval   . RLS (restless legs syndrome)   . Seizure disorder [facility])    she is on life long medication per Neuro and can not have driving privileges if she  stops meds  . Seizures (HCC)   . Thyroid disease     Past Surgical History:  Procedure Laterality Date  . BUNIONECTOMY     Right and Hammertoe  . CESAREAN SECTION  [id number]   x's 2  . ESOPHAGEAL MANOMETRY N/A 12/09/2016   Procedure: ESOPHAGEAL MANOMETRY (EM);  Surgeon: Carman Ching, MD;  Location: [facility];  Service: Endoscopy;  Laterality: N/A;  . ESOPHAGOGASTRODUODENOSCOPY (EGD) WITH PROPOFOL N/A 05/23/2020   Procedure: ESOPHAGOGASTRODUODENOSCOPY (EGD) WITH PROPOFOL with Botox;  Surgeon: Kathi Der, MD;  [facility];  Service: Gastroenterology;  Laterality: N/A;  . FOOT SURGERY      Left--Neuroma, Bunion  . LIPOSUCTION     abdominal  . SUBMUCOSAL INJECTION  05/23/2020   Procedure: SUBMUCOSAL INJECTION;  Surgeon: Kathi Der, MD;  Location: [facility];  Service: Gastroenterology;;  . TONSILLECTOMY     as a child  . WRIST SURGERY     Right wrist---Benign mass    Family History  Problem Relation Age of Onset  . Breast cancer Mother   . Hyperlipidemia Mother   . Prostate cancer Father   . Hyperlipidemia Father   . Depression Father   . Cancer Cousin        Colon Cancer  . Diabetes Other        Maternal Grandparent  . Kidney disease Other        Maternal Grandparent  . Stroke Other        Grandparent  . Heart disease Other        Grand Parent    Social History   Tobacco Use  . Smoking status: Former Smoker    Quit date: 07/21/1979    Years since quitting: 41.0  . Smokeless tobacco: Never Used  Vaping Use  . Vaping Use: Never used  Substance Use Topics  . Alcohol use: Yes    Comment: 1-2 drinks per night  . Drug use: No    Prior to Admission medications   Medication Sig Start Date End Date Taking? Authorizing Provider  diltiazem (CARDIZEM)  30 MG tablet Take 30 mg by mouth 3 (three) times daily.    [provider]  FLUoxetine (PROZAC) 40 MG capsule Take 40 mg by mouth every morning.    [provider]  gabapentin (NEURONTIN) 250 MG/5ML solution Take 500 mg by mouth 3 (three) times daily. 05/13/20   [provider]  lamoTRIgine (LAMICTAL) 25 MG tablet Take 50 mg by mouth at bedtime.    [provider]  levETIRAcetam (KEPPRA) 100 MG/ML solution Take 10 mLs by mouth in the morning and at bedtime. 05/10/20   [provider]  levothyroxine (SYNTHROID) 125 MCG tablet Take 125 mcg by mouth daily before breakfast.    [provider]  pantoprazole (PROTONIX) 40 MG tablet TAKE 1 TABLET BY MOUTH EVERY DAY Patient taking differently: Take 40 mg by mouth every morning.  10/05/15   Sheliah Hatch, MD   potassium chloride 20 MEQ/15ML (10%) SOLN Take 15 mLs (20 mEq total) by mouth 2 (two) times daily for 7 days. 07/13/20 07/20/20  Milos Milligan, MD  pramipexole (MIRAPEX) 1 MG tablet Take 3 mg by mouth at bedtime.     [provider]  simvastatin (ZOCOR) 20 MG tablet TAKE 1 TABLET BY MOUTH EVERY NIGHT AT BEDTIME Patient taking differently: Take 20 mg by mouth at bedtime.  10/18/15   Sheliah Hatch, MD  sucralfate (CARAFATE) 1 GM/10ML suspension Take 10 mLs (1 g total) by mouth 4 (four) times daily -  with meals and at bedtime. 05/10/20   Palumbo, April, MD    Allergies Morphine   REVIEW OF SYSTEMS  Negative except as noted here or in the History of Present Illness.   PHYSICAL EXAMINATION  Initial Vital Signs Blood pressure (!) 164/79, pulse 76, resp. rate 17, SpO2 98 %.  Examination General: Well-developed, well-nourished female in no acute distress; appearance consistent with age of record HENT: normocephalic; atraumatic Eyes: pupils equal, round and reactive to light; extraocular muscles intact Neck: supple Heart: regular rate and rhythm Lungs: clear to auscultation bilaterally Abdomen: soft; nondistended; nontender; bowel sounds present Extremities: No deformity; full range of motion; pulses normal Neurologic: Awake, alert and oriented; motor function intact in all extremities and symmetric; no facial droop Skin: Warm and dry; recent loss of right great toenail with newly erupted replacement nail present Psychiatric: Normal mood and affect   RESULTS  Summary of this visit's results, reviewed and interpreted by myself:   EKG Interpretation  Date/Time:  Saturday July 13 2020 02:06:03 EST Ventricular Rate:  76 PR Interval:    QRS Duration: 101 QT Interval:  452 QTC Calculation: 509 R Axis:   70 Text Interpretation: Sinus rhythm Prolonged QT interval No significant change was found Confirmed by Paula Libra ([id number]) on 07/13/2020 2:10:43 AM       Laboratory Studies: Results for orders placed or performed during the hospital encounter of 07/13/20 (from the past 24 hour(s))  CBC with Differential/Platelet     Status: None   Collection Time: 07/13/20  2:18 AM  Result Value Ref Range   WBC 10.3 4.0 - 10.5 K/uL   RBC 4.61 3.87 - 5.11 MIL/uL   Hemoglobin 14.6 12.0 - 15.0 g/dL   HCT 20.3 55.9 - 74.1 %   MCV 90.0 80.0 - 100.0 fL   MCH 31.7 26.0 - 34.0 pg   MCHC 35.2 30.0 - 36.0 g/dL   RDW 63.8 45.3 - 64.6 %   Platelets 274 150 - 400 K/uL   nRBC 0.0 0.0 -  0.2 %   Neutrophils Relative % 61 %   Neutro Abs 6.2 1.7 - 7.7 K/uL   Lymphocytes Relative 34 %   Lymphs Abs 3.5 0.7 - 4.0 K/uL   Monocytes Relative 5 %   Monocytes Absolute 0.5 0.1 - 1.0 K/uL   Eosinophils Relative 0 %   Eosinophils Absolute 0.0 0.0 - 0.5 K/uL   Basophils Relative 0 %   Basophils Absolute 0.0 0.0 - 0.1 K/uL   Immature Granulocytes 0 %   Abs Immature Granulocytes 0.03 0.00 - 0.07 K/uL  Basic metabolic panel     Status: Abnormal   Collection Time: 07/13/20  2:18 AM  Result Value Ref Range   Sodium 139 135 - 145 mmol/L   Potassium 2.6 (LL) 3.5 - 5.1 mmol/L   Chloride 102 98 - 111 mmol/L   CO2 25 22 - 32 mmol/L   Glucose, Bld 114 (H) 70 - 99 mg/dL   BUN 10 8 - 23 mg/dL   Creatinine, Ser 1.32 0.44 - 1.00 mg/dL   Calcium 9.0 8.9 - 44.0 mg/dL   GFR, Estimated >10 >27 mL/min   Anion gap 12 5 - 15  Troponin I (High Sensitivity)     Status: None   Collection Time: 07/13/20  2:18 AM  Result Value Ref Range   Troponin I (High Sensitivity) 4 <18 ng/L  Troponin I (High Sensitivity)     Status: None   Collection Time: 07/13/20  4:21 AM  Result Value Ref Range   Troponin I (High Sensitivity) 4 <18 ng/L   Imaging Studies: No results found.  ED COURSE and MDM  Nursing notes, initial and subsequent vitals signs, including pulse oximetry, reviewed and interpreted by myself.  Vitals:   07/13/20 0500 07/13/20 0530 07/13/20 0600 07/13/20 0630  BP: (!) 151/79  (!) 171/74 (!) 156/86 (!) 150/86  Pulse: 62 64 67 68  Resp: 16 (!) 25 20 17   Temp:    98 F (36.7 C)  TempSrc:      SpO2: 91% 96% 95% 99%   Medications  LORazepam (ATIVAN) injection 1 mg (1 mg Intravenous Given 07/13/20 0221)  glucagon (human recombinant) (GLUCAGEN) injection 1 mg (1 mg Intravenous Given 07/13/20 0221)  pantoprazole (PROTONIX) injection 40 mg (40 mg Intravenous Given 07/13/20 0222)  potassium chloride SA (KLOR-CON) CR tablet 40 mEq (40 mEq Oral Given 07/13/20 0331)  potassium chloride 10 mEq in 100 mL IVPB (0 mEq Intravenous Stopped 07/13/20 0629)   3:27 AM Patient sleeping after IV Ativan 1 mg and glucagon 1 mg.  Potassium 2.6, will begin repletion.  5:23 AM Patient pain-free at this time.  Her troponins have been normal and her EKG was unchanged.  I suspect her pain was indeed due to esophageal spasm, perhaps exacerbated by hypokalemia, which has improved with medication.  We will continue to infuse potassium chloride with plans to discharge about 7 AM on oral supplementation.   PROCEDURES  Procedures   ED DIAGNOSES     ICD-10-CM   1. Esophageal spasm  K22.4   2. Hypokalemia  E87.6        Jessen Siegman, 07/15/20, MD 07/13/20 [phone]

## 2020-07-13 NOTE — ED Triage Notes (Signed)
Central chest pain x2 hours, tylenol and Cardizem not effective. Reports similar pain with esophagitis, hx of same.

## 2021-03-15 ENCOUNTER — Emergency Department (HOSPITAL_COMMUNITY)
Admission: EM | Admit: 2021-03-15 | Discharge: 2021-03-15 | Disposition: A | Discharge status: Home / Self Care | Payer: Medicare Other | Attending: Emergency Medicine | Admitting: Emergency Medicine

## 2021-03-15 ENCOUNTER — Other Ambulatory Visit: Payer: Self-pay

## 2021-03-15 ENCOUNTER — Emergency Department (HOSPITAL_COMMUNITY): Payer: Medicare Other

## 2021-03-15 ENCOUNTER — Encounter (HOSPITAL_COMMUNITY): Payer: Self-pay | Admitting: Emergency Medicine

## 2021-03-15 DIAGNOSIS — W19XXXA Unspecified fall, initial encounter: Secondary | ICD-10-CM

## 2021-03-15 DIAGNOSIS — Z87891 Personal history of nicotine dependence: Secondary | ICD-10-CM | POA: Insufficient documentation

## 2021-03-15 DIAGNOSIS — W010XXA Fall on same level from slipping, tripping and stumbling without subsequent striking against object, initial encounter: Secondary | ICD-10-CM | POA: Insufficient documentation

## 2021-03-15 DIAGNOSIS — R32 Unspecified urinary incontinence: Secondary | ICD-10-CM | POA: Diagnosis not present

## 2021-03-15 DIAGNOSIS — Z79899 Other long term (current) drug therapy: Secondary | ICD-10-CM | POA: Insufficient documentation

## 2021-03-15 DIAGNOSIS — R269 Unspecified abnormalities of gait and mobility: Secondary | ICD-10-CM | POA: Insufficient documentation

## 2021-03-15 DIAGNOSIS — R2681 Unsteadiness on feet: Secondary | ICD-10-CM

## 2021-03-15 DIAGNOSIS — E039 Hypothyroidism, unspecified: Secondary | ICD-10-CM | POA: Diagnosis not present

## 2021-03-15 LAB — CBC WITH DIFFERENTIAL/PLATELET
Abs Immature Granulocytes: 0.05 10*3/uL (ref 0.00–0.07)
Basophils Absolute: 0 10*3/uL (ref 0.0–0.1)
Basophils Relative: 0 %
Eosinophils Absolute: 0.2 10*3/uL (ref 0.0–0.5)
Eosinophils Relative: 2 %
HCT: 40.3 % (ref 36.0–46.0)
Hemoglobin: 13.4 g/dL (ref 12.0–15.0)
Immature Granulocytes: 1 %
Lymphocytes Relative: 32 %
Lymphs Abs: 3.2 10*3/uL (ref 0.7–4.0)
MCH: 31.9 pg (ref 26.0–34.0)
MCHC: 33.3 g/dL (ref 30.0–36.0)
MCV: 96 fL (ref 80.0–100.0)
Monocytes Absolute: 0.6 10*3/uL (ref 0.1–1.0)
Monocytes Relative: 6 %
Neutro Abs: 5.9 10*3/uL (ref 1.7–7.7)
Neutrophils Relative %: 59 %
Platelets: 228 10*3/uL (ref 150–400)
RBC: 4.2 MIL/uL (ref 3.87–5.11)
RDW: 13 % (ref 11.5–15.5)
WBC: 10 10*3/uL (ref 4.0–10.5)
nRBC: 0 % (ref 0.0–0.2)

## 2021-03-15 LAB — COMPREHENSIVE METABOLIC PANEL
ALT: 27 U/L (ref 0–44)
AST: 28 U/L (ref 15–41)
Albumin: 4 g/dL (ref 3.5–5.0)
Alkaline Phosphatase: 93 U/L (ref 38–126)
Anion gap: 6 (ref 5–15)
BUN: 7 mg/dL — ABNORMAL LOW (ref 8–23)
CO2: 33 mmol/L — ABNORMAL HIGH (ref 22–32)
Calcium: 9.2 mg/dL (ref 8.9–10.3)
Chloride: 110 mmol/L (ref 98–111)
Creatinine, Ser: 0.77 mg/dL (ref 0.44–1.00)
GFR, Estimated: >60 mL/min (ref >60)
Glucose, Bld: 91 mg/dL (ref 70–99)
Potassium: 4.3 mmol/L (ref 3.5–5.1)
Sodium: 149 mmol/L — ABNORMAL HIGH (ref 135–145)
Total Bilirubin: 0.7 mg/dL (ref 0.3–1.2)
Total Protein: 6.4 g/dL — ABNORMAL LOW (ref 6.5–8.1)

## 2021-03-15 LAB — TSH: TSH: 12.915 u[IU]/mL — ABNORMAL HIGH (ref 0.350–4.500)

## 2021-03-15 NOTE — ED Notes (Signed)
Husband at bedside. Paper scrubs and brief provided due to soiled clothes. D/C instructions explained, verbalized understanding and patient had no questions. Wheelchair provided for discharge as well.

## 2021-03-15 NOTE — ED Provider Notes (Signed)
[facility]-EMERGENCY DEPT Provider Note   CSN: [id number] Arrival date & time: 03/15/21  3335     History Chief Complaint  Patient presents with   Marletta Lor    Megan Singleton is a 70 y.o. female with a past medical history significant for alcohol abuse, recently out of detox several days ago, hypothyroidism who presents the department today complaining of frequent falls.  She was evaluated 5 days ago at [facility] who reported she had no broken bones at that time, they noted bradycardia, and hypothyroidism.  Patient is reported once a day falls since her discharge, reports she hit her left eye a few days ago and has a bruise that is healing.  She was brought in this morning after tripping on her dog, without any obvious injury.  She wants to know why she is falling so frequently, she also complains of urinary incontinence.  She denies headache, vision changes, chest pain, shortness of breath.   Fall Pertinent negatives include no chest pain, no headaches and no shortness of breath.      Past Medical History:  Diagnosis Date   Alcohol abuse    Allergy    Depression    Fibromyalgia    GERD (gastroesophageal reflux disease)    Hashimoto's thyroiditis    Hyperlipidemia    IBS (irritable bowel syndrome)    Osteopenia    Prolonged QT interval    RLS (restless legs syndrome)    Seizure disorder (HCC)    she is on life long medication per Neuro and can not have driving privileges if she  stops meds   Seizures (HCC)    Thyroid disease     Patient Active Problem List   Diagnosis Date Noted   Panic attacks 11/19/2014   Pap smear for cervical cancer screening 05/21/2014   Cough 09/27/2013   Thoracic back pain 08/18/2011   Conjunctivitis 07/16/2011   General medical examination 01/06/2011   DYSPAREUNIA 02/10/2010   VAGINITIS, ATROPHIC 02/10/2010   PILONIDAL CYST 02/10/2010   PNEUMONIA 01/16/2010   INCONTINENCE, MIXED, URGE/STRESS 01/16/2010    CHEST PAIN UNSPECIFIED 10/04/2009   MALAISE AND FATIGUE 09/10/2009   DIARRHEA, CHRONIC 09/10/2009   VISUAL CHANGES 06/10/2009   GANGLION CYST, WRIST, LEFT 06/10/2009   POSTMENOPAUSAL STATUS 06/10/2009   PLANTAR FASCIITIS 12/13/2008   RESTLESS LEGS SYNDROME 09/05/2008   Impacted cerumen 07/23/2008   DEPRESSION 07/04/2008   OTITIS EXTERNA 07/04/2008   VIRAL URI 07/04/2008   SWEATING 08/11/2007   Hyperlipidemia 08/02/2007   ALCOHOL ABUSE 08/02/2007   SINUSITIS- ACUTE-NOS 05/11/2007   ALLERGIC RHINITIS 05/11/2007   PAIN IN JOINT, HAND 03/09/2007   Hypothyroidism 12/15/2006   HEMORRHOIDS 12/15/2006   GERD 12/15/2006   MICROSCOPIC HEMATURIA 12/15/2006   OSTEOPENIA 12/15/2006   Seizure disorder (HCC) 12/15/2006    Past Surgical History:  Procedure Laterality Date   BUNIONECTOMY     Right and Hammertoe   CESAREAN SECTION  [id number],5638   x's 2   ESOPHAGEAL MANOMETRY N/A 12/09/2016   Procedure: ESOPHAGEAL MANOMETRY (EM);  Surgeon: Carman Ching, MD;  Location: [facility];  Service: Endoscopy;  Laterality: N/A;   ESOPHAGOGASTRODUODENOSCOPY (EGD) WITH PROPOFOL N/A 05/23/2020   Procedure: ESOPHAGOGASTRODUODENOSCOPY (EGD) WITH PROPOFOL with Botox;  Surgeon: Kathi Der, MD;  Location: [facility];  Service: Gastroenterology;  Laterality: N/A;   FOOT SURGERY     Left--Neuroma, Bunion   LIPOSUCTION     abdominal   SUBMUCOSAL INJECTION  05/23/2020   Procedure: SUBMUCOSAL INJECTION;  Surgeon:  Kathi Der, MD;  Location: [facility];  Service: Gastroenterology;;   TONSILLECTOMY     as a child   WRIST SURGERY     Right wrist---Benign mass     OB History   No obstetric history on file.     Family History  Problem Relation Age of Onset   Breast cancer Mother    Hyperlipidemia Mother    Prostate cancer Father    Hyperlipidemia Father    Depression Father    Cancer Cousin        Colon Cancer   Diabetes Other        Maternal Grandparent   Kidney disease Other         Maternal Grandparent   Stroke Other        Grandparent   Heart disease Other        Grand Parent    Social History   Tobacco Use   Smoking status: Former    Types: Cigarettes    Quit date: 07/21/1979    Years since quitting: 41.6   Smokeless tobacco: Never  Vaping Use   Vaping Use: Never used  Substance Use Topics   Alcohol use: Yes    Comment: 1-2 drinks per night   Drug use: No    Home Medications Prior to Admission medications   Medication Sig Start Date End Date Taking? Authorizing Provider  diltiazem (CARDIZEM) 30 MG tablet Take 30 mg by mouth 3 (three) times daily.    [provider]  FLUoxetine (PROZAC) 40 MG capsule Take 40 mg by mouth every morning.    [provider]  gabapentin (NEURONTIN) 250 MG/5ML solution Take 500 mg by mouth 3 (three) times daily. 05/13/20   [provider]  lamoTRIgine (LAMICTAL) 25 MG tablet Take 50 mg by mouth at bedtime.    [provider]  levETIRAcetam (KEPPRA) 100 MG/ML solution Take 10 mLs by mouth in the morning and at bedtime. 05/10/20   [provider]  levothyroxine (SYNTHROID) 125 MCG tablet Take 125 mcg by mouth daily before breakfast.    [provider]  pantoprazole (PROTONIX) 40 MG tablet TAKE 1 TABLET BY MOUTH EVERY DAY Patient taking differently: Take 40 mg by mouth every morning.  10/05/15   Sheliah Hatch, MD  potassium chloride 20 MEQ/15ML (10%) SOLN Take 15 mLs (20 mEq total) by mouth 2 (two) times daily for 7 days. 07/13/20 07/20/20  Molpus, John, MD  pramipexole (MIRAPEX) 1 MG tablet Take 3 mg by mouth at bedtime.     [provider]  simvastatin (ZOCOR) 20 MG tablet TAKE 1 TABLET BY MOUTH EVERY NIGHT AT BEDTIME Patient taking differently: Take 20 mg by mouth at bedtime.  10/18/15   Sheliah Hatch, MD  sucralfate (CARAFATE) 1 GM/10ML suspension Take 10 mLs (1 g total) by mouth 4 (four) times daily -  with meals and at bedtime. 05/10/20   Palumbo, April,  MD    Allergies    Morphine  Review of Systems   Review of Systems  Constitutional:  Positive for fatigue.  Respiratory:  Negative for shortness of breath.   Cardiovascular:  Negative for chest pain and palpitations.  Genitourinary:  Positive for frequency.       Incontinence  Musculoskeletal:  Negative for neck pain and neck stiffness.  Neurological:  Positive for weakness. Negative for dizziness and headaches.  Psychiatric/Behavioral:  Negative for confusion.    Physical Exam Updated Vital Signs BP (!) 152/78   Pulse Marland Kitchen)  56   Temp 97.7 F (36.5 C) (Oral)   Resp 15   SpO2 100%   Physical Exam Vitals and nursing note reviewed.  Constitutional:      General: She is not in acute distress.    Appearance: Normal appearance. She is not ill-appearing.  HENT:     Head: Normocephalic and atraumatic.     Mouth/Throat:     Mouth: Mucous membranes are moist.     Pharynx: No oropharyngeal exudate.  Eyes:     Extraocular Movements: Extraocular movements intact.     Pupils: Pupils are equal, round, and reactive to light.  Cardiovascular:     Rate and Rhythm: Normal rate and regular rhythm.     Heart sounds: No murmur heard. Pulmonary:     Effort: Pulmonary effort is normal. No respiratory distress.     Breath sounds: Normal breath sounds.  Musculoskeletal:        General: No deformity. Normal range of motion.     Cervical back: Normal range of motion and neck supple.     Right lower leg: No edema.     Left lower leg: No edema.  Skin:    General: Skin is warm and dry.     Capillary Refill: Capillary refill takes less than 2 seconds.  Neurological:     General: No focal deficit present.     Mental Status: She is alert and oriented to person, place, and time.     Cranial Nerves: No cranial nerve deficit.     Comments: 5 out of 5 strength in upper extremities.  3 out of 5 strength in the lower extremities.  Intact deep tendon reflexes.  Patient does not pass Romberg, is unable  to let go of my hands after standing up.  Psychiatric:     Comments: Patient with somewhat flat, depressed affect    ED Results / Procedures / Treatments   Labs (all labs ordered are listed, but only abnormal results are displayed) Labs Reviewed  TSH - Abnormal; Notable for the following components:      Result Value   TSH 12.915 (*)    All other components within normal limits  COMPREHENSIVE METABOLIC PANEL - Abnormal; Notable for the following components:   Sodium 149 (*)    CO2 33 (*)    BUN 7 (*)    Total Protein 6.4 (*)    All other components within normal limits  CBC WITH DIFFERENTIAL/PLATELET  URINALYSIS, ROUTINE W REFLEX MICROSCOPIC    EKG None  Radiology CT HEAD WO CONTRAST (5MM)  Result Date: 03/15/2021 CLINICAL DATA:  Posttraumatic headache after multiple falls. EXAM: CT HEAD WITHOUT CONTRAST CT CERVICAL SPINE WITHOUT CONTRAST TECHNIQUE: Multidetector CT imaging of the head and cervical spine was performed following the standard protocol without intravenous contrast. Multiplanar CT image reconstructions of the cervical spine were also generated. COMPARISON:  February 18, 2016. FINDINGS: CT HEAD FINDINGS Brain: No evidence of acute infarction, hemorrhage, hydrocephalus, extra-axial collection or mass lesion/mass effect. Vascular: No hyperdense vessel or unexpected calcification. Skull: Normal. Negative for fracture or focal lesion. Sinuses/Orbits: No acute finding. Other: None. CT CERVICAL SPINE FINDINGS Alignment: Normal. Skull base and vertebrae: No acute fracture. No primary bone lesion or focal pathologic process. Soft tissues and spinal canal: No prevertebral fluid or swelling. No visible canal hematoma. Disc levels: Mild degenerative disc disease is noted at C5-6 and C6-7. Upper chest: Negative. Other: None. IMPRESSION: No acute intracranial abnormality seen. Mild multilevel degenerative disc disease is noted  in the cervical spine. No acute abnormality is noted.  Electronically Signed   By: Lupita Raider M.D.   On: 03/15/2021 08:45   CT Cervical Spine Wo Contrast  Result Date: 03/15/2021 CLINICAL DATA:  Posttraumatic headache after multiple falls. EXAM: CT HEAD WITHOUT CONTRAST CT CERVICAL SPINE WITHOUT CONTRAST TECHNIQUE: Multidetector CT imaging of the head and cervical spine was performed following the standard protocol without intravenous contrast. Multiplanar CT image reconstructions of the cervical spine were also generated. COMPARISON:  February 18, 2016. FINDINGS: CT HEAD FINDINGS Brain: No evidence of acute infarction, hemorrhage, hydrocephalus, extra-axial collection or mass lesion/mass effect. Vascular: No hyperdense vessel or unexpected calcification. Skull: Normal. Negative for fracture or focal lesion. Sinuses/Orbits: No acute finding. Other: None. CT CERVICAL SPINE FINDINGS Alignment: Normal. Skull base and vertebrae: No acute fracture. No primary bone lesion or focal pathologic process. Soft tissues and spinal canal: No prevertebral fluid or swelling. No visible canal hematoma. Disc levels: Mild degenerative disc disease is noted at C5-6 and C6-7. Upper chest: Negative. Other: None. IMPRESSION: No acute intracranial abnormality seen. Mild multilevel degenerative disc disease is noted in the cervical spine. No acute abnormality is noted. Electronically Signed   By: Lupita Raider M.D.   On: 03/15/2021 08:45    Procedures Procedures   Medications Ordered in ED Medications - No data to display  ED Course  I have reviewed the triage vital signs and the nursing notes.  Pertinent labs & imaging results that were available during my care of the patient were reviewed by me and considered in my medical decision making (see chart for details).    MDM Rules/Calculators/A&P                          Differential for weakness, frequent falls, urinary incontinence considered includes NPH, intracranial bleed, worsening of patient's known hypothyroid  derangement, UTI.  Unclear what portion of patient's weakness, urinary incontinence is different from her baseline which has been ongoing for months, but may be worsening per patient.  Will evaluate for intracranial bleed or injury, reassess electrolyte and TSH derangements, evaluate for UTI, however patient needs close follow-up with her primary care and neurologist for further evaluation after her evaluation today.  I ordered, interpreted, and independently reviewed lab results including TSH, CMP, CBC, CT head and neck.  Her lab work was significant for a mild hypernatremia, elevated TSH at 12.915.  This is decreased significantly from her last evaluation 5 days ago however still is quite elevated.  Her CT head and neck is without abnormality.  Some weakness, difficulties with balance could be attributed to her thyroid derangement however patient should seek further evaluation by her PCP and neurologist.  Work-up in the department today is reassuring that patient does not have an emergent or worsening cause of her balance issues, urinary incontinence.  However her symptoms are worrying, and are affecting her ability to get around and I believe she needs evaluation for further work-up.  Urgent ambulatory referral to neurology placed, home health orders for PT/OT evaluation given.  Patient discharged with extensive return precautions, including fall and head injury worsening balance issues, loss of consciousness or other focal neurologic deficit. Final Clinical Impression(s) / ED Diagnoses Final diagnoses:  Fall, initial encounter  Urinary incontinence, unspecified type  Gait instability    Rx / DC Orders ED Discharge Orders          Ordered    Ambulatory referral to  Neurology       Comments: An appointment is requested in approximately: as soon as possible. Patient is having ongoing balance and weakness issues and would benefit from further evaluation for possible LP vs. MRI.   03/15/21 0955     Home Health        03/15/21 0955    Face-to-face encounter (required for Medicare/Medicaid patients)       Comments: I Olene Floss certify that this patient is under my care and that I, or a nurse practitioner or physician's assistant working with me, had a face-to-face encounter that meets the physician face-to-face encounter requirements with this patient on 03/15/2021. The encounter with the patient was in whole, or in part for the following medical condition(s) which is the primary reason for home health care (List medical condition): balance issue, gait instability, unsafe ambulation   03/15/21 0955             Olene Floss, PA-C 03/15/21 1002    [place], DO 03/15/21 1216

## 2021-03-15 NOTE — ED Notes (Signed)
Patient requesting a foley catheter for urine collection, explained that this is not possible and offered the pur-wick again to patient, patient accepted. Pt stated that the other option is for her to 'pee all over the bed.'

## 2021-03-15 NOTE — ED Triage Notes (Signed)
Patient arrives from home BIB EMS. Patient tripped over her dog. Patient has no injuries. No head injuries. Patient recently out of alcohol detox. Patient had a fall in rehab also while detoxing. Patient requesting evaluation for why she is falling.   Patient tripped over her dog, which caused her fall. No head injury, no AMS, no thinners, no shortening or deformity. Patient has a black donut pillow with her due to a "bruised sacrum."  Patient noted to be agitated and hostile with EMS.

## 2021-03-15 NOTE — Discharge Instructions (Addendum)
As we discussed today your work-up today was significant for elevated TSH which signifies that your thyroid function is low, however it is improved from your last hospitalization a few days ago.  Continue taking your thyroid supplementation as directed but follow-up with your primary care provider as soon as you are able for closer management.  Your CT head and neck were unremarkable.  I am still concerned about your gait instability, balance issues and believe that you warrant a close follow-up with neurology.  I put in an ambulatory referral, however if you have not heard anything early next week please reach out to Dr. Alton Revere to schedule an appointment.  Please return if you have a fall and strike your head, or lose consciousness.

## 2021-03-15 NOTE — ED Notes (Signed)
Pt stated she would rather not have a purewick put on. Pt stated that she would rather have a foley catheter put in. I informed patient that I cannot insert a foley catheter without a doctor order. Pt said "fine . I'll just ask the doctor when they come in."

## 2021-03-15 NOTE — ED Notes (Addendum)
Pt soiled sheets. Sheets have been changed. Pt placed on purewick and given a dry brief.

## 2021-03-20 ENCOUNTER — Other Ambulatory Visit: Payer: Self-pay

## 2021-03-20 ENCOUNTER — Emergency Department (HOSPITAL_COMMUNITY)
Admission: EM | Admit: 2021-03-20 | Discharge: 2021-03-20 | Disposition: A | Discharge status: Home / Self Care | Payer: Medicare Other | Attending: Emergency Medicine | Admitting: Emergency Medicine

## 2021-03-20 ENCOUNTER — Emergency Department (HOSPITAL_COMMUNITY): Payer: Medicare Other

## 2021-03-20 DIAGNOSIS — Z5321 Procedure and treatment not carried out due to patient leaving prior to being seen by health care provider: Secondary | ICD-10-CM | POA: Diagnosis not present

## 2021-03-20 DIAGNOSIS — R195 Other fecal abnormalities: Secondary | ICD-10-CM | POA: Insufficient documentation

## 2021-03-20 DIAGNOSIS — R059 Cough, unspecified: Secondary | ICD-10-CM | POA: Insufficient documentation

## 2021-03-20 DIAGNOSIS — R1900 Intra-abdominal and pelvic swelling, mass and lump, unspecified site: Secondary | ICD-10-CM | POA: Diagnosis not present

## 2021-03-20 DIAGNOSIS — R0602 Shortness of breath: Secondary | ICD-10-CM | POA: Diagnosis not present

## 2021-03-20 LAB — COMPREHENSIVE METABOLIC PANEL
ALT: 43 U/L (ref 0–44)
AST: 42 U/L — ABNORMAL HIGH (ref 15–41)
Albumin: 4 g/dL (ref 3.5–5.0)
Alkaline Phosphatase: 112 U/L (ref 38–126)
Anion gap: 7 (ref 5–15)
BUN: 13 mg/dL (ref 8–23)
CO2: 26 mmol/L (ref 22–32)
Calcium: 9.4 mg/dL (ref 8.9–10.3)
Chloride: 114 mmol/L — ABNORMAL HIGH (ref 98–111)
Creatinine, Ser: 0.58 mg/dL (ref 0.44–1.00)
GFR, Estimated: >60 mL/min (ref >60)
Glucose, Bld: 100 mg/dL — ABNORMAL HIGH (ref 70–99)
Potassium: 4.1 mmol/L (ref 3.5–5.1)
Sodium: 147 mmol/L — ABNORMAL HIGH (ref 135–145)
Total Bilirubin: 0.4 mg/dL (ref 0.3–1.2)
Total Protein: 6.5 g/dL (ref 6.5–8.1)

## 2021-03-20 LAB — CBC WITH DIFFERENTIAL/PLATELET
Abs Immature Granulocytes: 0.03 10*3/uL (ref 0.00–0.07)
Basophils Absolute: 0.1 10*3/uL (ref 0.0–0.1)
Basophils Relative: 1 %
Eosinophils Absolute: 0.2 10*3/uL (ref 0.0–0.5)
Eosinophils Relative: 2 %
HCT: 39.5 % (ref 36.0–46.0)
Hemoglobin: 13.3 g/dL (ref 12.0–15.0)
Immature Granulocytes: 0 %
Lymphocytes Relative: 42 %
Lymphs Abs: 3.5 10*3/uL (ref 0.7–4.0)
MCH: 31.6 pg (ref 26.0–34.0)
MCHC: 33.7 g/dL (ref 30.0–36.0)
MCV: 93.8 fL (ref 80.0–100.0)
Monocytes Absolute: 0.6 10*3/uL (ref 0.1–1.0)
Monocytes Relative: 7 %
Neutro Abs: 3.9 10*3/uL (ref 1.7–7.7)
Neutrophils Relative %: 48 %
Platelets: 249 10*3/uL (ref 150–400)
RBC: 4.21 MIL/uL (ref 3.87–5.11)
RDW: 12.8 % (ref 11.5–15.5)
WBC: 8.2 10*3/uL (ref 4.0–10.5)
nRBC: 0 % (ref 0.0–0.2)

## 2021-03-20 LAB — TROPONIN I (HIGH SENSITIVITY): Troponin I (High Sensitivity): 3 ng/L (ref <18)

## 2021-03-20 LAB — LIPASE, BLOOD: Lipase: 48 U/L (ref 11–51)

## 2021-03-20 NOTE — ED Notes (Signed)
Pt's husband states that the patient was at [facility] rehab about two weeks ago and had a fall, then she fell Saturday morning at the house and she was evaluated here, he says that she's unable to walk when she takes all her meds and she received two new ones while in detox, tonight she woke him up saying that she was short of breath

## 2021-03-20 NOTE — ED Triage Notes (Signed)
Pt from home. States she has had a hard time and has been increasingly SHOB.

## 2021-03-20 NOTE — ED Provider Notes (Signed)
Emergency Medicine Provider Triage Evaluation Note  Megan Singleton , a 70 y.o. female  was evaluated in triage.  Pt complains of shortness of breath.  The patient reports shortness of breath, onset yesterday.  She reports an associated cough.  No chest pain.  She also reports that her abdomen is more swollen.  Last bowel movement was just prior to arrival.  She also reports that her stool has been white.  She has a history of alcohol use, last drink was 2 weeks ago prior to going to a rehab facility.  No vomiting, fever, chills.   Review of Systems  Positive: Shortness of breath, cough, abdominal swelling, white stools Negative: Chest pain, fever, chills, vomiting  Physical Exam  BP (!) 163/110 (BP Location: Left Arm)   Pulse 65   Temp 97.6 F (36.4 C) (Oral)   Resp 15   Ht 5\' 4"  (1.626 m)   Wt 62.1 kg   SpO2 96%   BMI 23.52 kg/m  Gen:   Awake, no distress   Resp:  Normal effort  MSK:   Moves extremities without difficulty  Other:  Abdomen is moderately distended but soft.  Medical Decision Making  Medically screening exam initiated at 2:52 AM.  Appropriate orders placed.  was informed that the remainder of the evaluation will be completed by another provider, this initial triage assessment does not replace that evaluation, and the importance of remaining in the ED until their evaluation is complete.  Labs and imaging have been ordered.  She will require further work-up and evaluation.   [name] A, PA-C 03/20/21 05/20/21    [id number], MD 03/20/21 [phone]

## 2021-03-20 NOTE — ED Triage Notes (Signed)
Pt wasn't witnessed leaving however the leads and gown were in the room and her clothes were gone

## 2021-03-22 ENCOUNTER — Other Ambulatory Visit: Payer: Self-pay

## 2021-03-22 ENCOUNTER — Encounter (HOSPITAL_BASED_OUTPATIENT_CLINIC_OR_DEPARTMENT_OTHER): Payer: Self-pay

## 2021-03-22 ENCOUNTER — Emergency Department (HOSPITAL_BASED_OUTPATIENT_CLINIC_OR_DEPARTMENT_OTHER)
Admission: EM | Admit: 2021-03-22 | Discharge: 2021-03-22 | Disposition: A | Discharge status: Home / Self Care | Payer: Medicare Other | Attending: Emergency Medicine | Admitting: Emergency Medicine

## 2021-03-22 ENCOUNTER — Emergency Department (HOSPITAL_BASED_OUTPATIENT_CLINIC_OR_DEPARTMENT_OTHER): Payer: Medicare Other

## 2021-03-22 DIAGNOSIS — K5900 Constipation, unspecified: Secondary | ICD-10-CM | POA: Insufficient documentation

## 2021-03-22 DIAGNOSIS — R059 Cough, unspecified: Secondary | ICD-10-CM | POA: Diagnosis not present

## 2021-03-22 DIAGNOSIS — Z87891 Personal history of nicotine dependence: Secondary | ICD-10-CM | POA: Diagnosis not present

## 2021-03-22 DIAGNOSIS — K219 Gastro-esophageal reflux disease without esophagitis: Secondary | ICD-10-CM | POA: Diagnosis not present

## 2021-03-22 DIAGNOSIS — R1310 Dysphagia, unspecified: Secondary | ICD-10-CM | POA: Diagnosis not present

## 2021-03-22 DIAGNOSIS — R140 Abdominal distension (gaseous): Secondary | ICD-10-CM | POA: Insufficient documentation

## 2021-03-22 DIAGNOSIS — R109 Unspecified abdominal pain: Secondary | ICD-10-CM

## 2021-03-22 LAB — COMPREHENSIVE METABOLIC PANEL
ALT: 37 U/L (ref 0–44)
AST: 32 U/L (ref 15–41)
Albumin: 3.9 g/dL (ref 3.5–5.0)
Alkaline Phosphatase: 117 U/L (ref 38–126)
Anion gap: 7 (ref 5–15)
BUN: 14 mg/dL (ref 8–23)
CO2: 27 mmol/L (ref 22–32)
Calcium: 8.9 mg/dL (ref 8.9–10.3)
Chloride: 107 mmol/L (ref 98–111)
Creatinine, Ser: 0.7 mg/dL (ref 0.44–1.00)
GFR, Estimated: >60 mL/min (ref >60)
Glucose, Bld: 109 mg/dL — ABNORMAL HIGH (ref 70–99)
Potassium: 3.9 mmol/L (ref 3.5–5.1)
Sodium: 141 mmol/L (ref 135–145)
Total Bilirubin: 0.3 mg/dL (ref 0.3–1.2)
Total Protein: 6.3 g/dL — ABNORMAL LOW (ref 6.5–8.1)

## 2021-03-22 LAB — CBC WITH DIFFERENTIAL/PLATELET
Abs Immature Granulocytes: 0.02 10*3/uL (ref 0.00–0.07)
Basophils Absolute: 0.1 10*3/uL (ref 0.0–0.1)
Basophils Relative: 1 %
Eosinophils Absolute: 0.2 10*3/uL (ref 0.0–0.5)
Eosinophils Relative: 2 %
HCT: 38.5 % (ref 36.0–46.0)
Hemoglobin: 12.9 g/dL (ref 12.0–15.0)
Immature Granulocytes: 0 %
Lymphocytes Relative: 36 %
Lymphs Abs: 2.6 10*3/uL (ref 0.7–4.0)
MCH: 31.6 pg (ref 26.0–34.0)
MCHC: 33.5 g/dL (ref 30.0–36.0)
MCV: 94.4 fL (ref 80.0–100.0)
Monocytes Absolute: 0.7 10*3/uL (ref 0.1–1.0)
Monocytes Relative: 9 %
Neutro Abs: 3.7 10*3/uL (ref 1.7–7.7)
Neutrophils Relative %: 52 %
Platelets: 236 10*3/uL (ref 150–400)
RBC: 4.08 MIL/uL (ref 3.87–5.11)
RDW: 12.7 % (ref 11.5–15.5)
WBC: 7.1 10*3/uL (ref 4.0–10.5)
nRBC: 0 % (ref 0.0–0.2)

## 2021-03-22 MED ORDER — ALUM & MAG HYDROXIDE-SIMETH 200-200-20 MG/5ML PO SUSP
30.0000 mL | Freq: Once | ORAL | Status: AC
  Administered 2021-03-22: 30 mL via ORAL
  Filled 2021-03-22: qty 30

## 2021-03-22 MED ORDER — HYDROXYZINE HCL 25 MG PO TABS: 25.0000 mg | ORAL_TABLET | Freq: Three times a day (TID) | ORAL | 0 refills | Status: DC | PRN

## 2021-03-22 MED ORDER — BENZONATATE 100 MG PO CAPS: 100.0000 mg | ORAL_CAPSULE | Freq: Three times a day (TID) | ORAL | 0 refills | Status: DC

## 2021-03-22 MED ORDER — LIDOCAINE VISCOUS HCL 2 % MT SOLN
15.0000 mL | Freq: Once | OROMUCOSAL | Status: AC
  Administered 2021-03-22: 15 mL via ORAL
  Filled 2021-03-22: qty 15

## 2021-03-22 NOTE — ED Provider Notes (Signed)
[facility] EMERGENCY DEPARTMENT Provider Note  CSN: [id number] Arrival date & time: 03/22/21 1632    History Chief Complaint  Patient presents with   Choking    Megan Singleton is a 70 y.o. female with history of alcohol abuse and anxiety, recently went to a detox facility in [place] where her Xanax was also stopped. She had several falls while there with negative ED workup x 2, referred to Neurology who felt her symptoms may have been related to Abilify and referred her back to Psychiatry. She has also has a history of dysphagia, followed by GI has had prior EGD. She was in the [facility] 9/1 for SOB but left prior to being seen. She was in her PCP office later that day for same and for her dysphagia, advised to follow up with GI. She reports today she was coughing more and so she took some cough syrup. Soon afterwards, she began having a choking sensation in her R throat. She has not been vomiting. She states every time she eats or drinks anything she 'gets ascites' which is how she interprets abdominal bloating. She has been constipated for several weeks also, minimal results with OTC bowel regimen.    Past Medical History:  Diagnosis Date   Alcohol abuse    Allergy    Depression    Fibromyalgia    GERD (gastroesophageal reflux disease)    Hashimoto's thyroiditis    Hyperlipidemia    IBS (irritable bowel syndrome)    Osteopenia    Prolonged QT interval    RLS (restless legs syndrome)    Seizure disorder (HCC)    she is on life long medication per Neuro and can not have driving privileges if she  stops meds   Seizures (HCC)    Thyroid disease     Past Surgical History:  Procedure Laterality Date   BUNIONECTOMY     Right and Hammertoe   CESAREAN SECTION  [id number]   x's 2   ESOPHAGEAL MANOMETRY N/A 12/09/2016   Procedure: ESOPHAGEAL MANOMETRY (EM);  Surgeon: Carman Ching, MD;  Location: [facility];  Service: Endoscopy;  Laterality: N/A;    ESOPHAGOGASTRODUODENOSCOPY (EGD) WITH PROPOFOL N/A 05/23/2020   Procedure: ESOPHAGOGASTRODUODENOSCOPY (EGD) WITH PROPOFOL with Botox;  Surgeon: Kathi Der, MD;  Location: [facility];  Service: Gastroenterology;  Laterality: N/A;   FOOT SURGERY     Left--Neuroma, Bunion   LIPOSUCTION     abdominal   SUBMUCOSAL INJECTION  05/23/2020   Procedure: SUBMUCOSAL INJECTION;  Surgeon: Kathi Der, MD;  Location: [facility];  Service: Gastroenterology;;   TONSILLECTOMY     as a child   WRIST SURGERY     Right wrist---Benign mass    Family History  Problem Relation Age of Onset   Breast cancer Mother    Hyperlipidemia Mother    Prostate cancer Father    Hyperlipidemia Father    Depression Father    Cancer Cousin        Colon Cancer   Diabetes Other        Maternal Grandparent   Kidney disease Other        Maternal Grandparent   Stroke Other        Grandparent   Heart disease Other        Grand Parent    Social History   Tobacco Use   Smoking status: Former    Types: Cigarettes    Quit date: 07/21/1979    Years since quitting: 41.6  Smokeless tobacco: Never  Vaping Use   Vaping Use: Never used  Substance Use Topics   Alcohol use: Not Currently    Comment: 1-2 drinks per night   Drug use: No     Home Medications Prior to Admission medications   Medication Sig Start Date End Date Taking? Authorizing Provider  benzonatate (TESSALON) 100 MG capsule Take 1 capsule (100 mg total) by mouth every 8 (eight) hours. 03/22/21  Yes Pollyann SavoySheldon, Aaron Bostwick B, MD  hydrOXYzine (ATARAX/VISTARIL) 25 MG tablet Take 1 tablet (25 mg total) by mouth every 8 (eight) hours as needed for anxiety. 03/22/21  Yes Pollyann SavoySheldon, Loghan Kurtzman B, MD  diltiazem (CARDIZEM) 30 MG tablet Take 30 mg by mouth 3 (three) times daily.    [provider]  FLUoxetine (PROZAC) 40 MG capsule Take 40 mg by mouth every morning.    [provider]  gabapentin (NEURONTIN) 250 MG/5ML solution Take 500 mg by  mouth 3 (three) times daily. 05/13/20   [provider]  lamoTRIgine (LAMICTAL) 25 MG tablet Take 50 mg by mouth at bedtime.    [provider]  levETIRAcetam (KEPPRA) 100 MG/ML solution Take 10 mLs by mouth in the morning and at bedtime. 05/10/20   [provider]  levothyroxine (SYNTHROID) 125 MCG tablet Take 125 mcg by mouth daily before breakfast.    [provider]  pantoprazole (PROTONIX) 40 MG tablet TAKE 1 TABLET BY MOUTH EVERY DAY Patient taking differently: Take 40 mg by mouth every morning.  10/05/15   Sheliah Hatchabori, Katherine E, MD  potassium chloride 20 MEQ/15ML (10%) SOLN Take 15 mLs (20 mEq total) by mouth 2 (two) times daily for 7 days. 07/13/20 07/20/20  Molpus, John, MD  pramipexole (MIRAPEX) 1 MG tablet Take 3 mg by mouth at bedtime.     [provider]  simvastatin (ZOCOR) 20 MG tablet TAKE 1 TABLET BY MOUTH EVERY NIGHT AT BEDTIME Patient taking differently: Take 20 mg by mouth at bedtime.  10/18/15   Sheliah Hatchabori, Katherine E, MD  sucralfate (CARAFATE) 1 GM/10ML suspension Take 10 mLs (1 g total) by mouth 4 (four) times daily -  with meals and at bedtime. 05/10/20   Palumbo, April, MD     Allergies    Morphine   Review of Systems   Review of Systems A comprehensive review of systems was completed and negative except as noted in HPI.    Physical Exam BP (!) 134/58   Pulse 76   Ht 5\' 4"  (1.626 m)   Wt 62.1 kg   SpO2 96%   BMI 23.52 kg/m   Physical Exam Vitals and nursing note reviewed.  Constitutional:      Appearance: Normal appearance.  HENT:     Head: Normocephalic and atraumatic.     Nose: Nose normal.     Mouth/Throat:     Mouth: Mucous membranes are moist.  Eyes:     Extraocular Movements: Extraocular movements intact.     Conjunctiva/sclera: Conjunctivae normal.  Cardiovascular:     Rate and Rhythm: Normal rate.  Pulmonary:     Effort: Pulmonary effort is normal. No respiratory distress.     Breath sounds: Normal  breath sounds. No stridor. No wheezing, rhonchi or rales.     Comments: Frequent dry cough Abdominal:     General: Abdomen is flat.     Palpations: Abdomen is soft. There is no mass.     Tenderness: There is no abdominal tenderness. There is no guarding.  Musculoskeletal:  General: No swelling. Normal range of motion.     Cervical back: Neck supple.  Skin:    General: Skin is warm and dry.  Neurological:     General: No focal deficit present.     Mental Status: She is alert.  Psychiatric:     Comments: Agitated, argumentative, restless     ED Results / Procedures / Treatments   Labs (all labs ordered are listed, but only abnormal results are displayed) Labs Reviewed  COMPREHENSIVE METABOLIC PANEL - Abnormal; Notable for the following components:      Result Value   Glucose, Bld 109 (*)    Total Protein 6.3 (*)    All other components within normal limits  CBC WITH DIFFERENTIAL/PLATELET    EKG None   Radiology DG ABD ACUTE 2+V W 1V CHEST  Result Date: 03/22/2021 CLINICAL DATA:  Choking today history of esophageal stricture EXAM: DG ABDOMEN ACUTE WITH 1 VIEW CHEST COMPARISON:  CT 02/21/2019 chest radiograph 03/20/2021 FINDINGS: Nonobstructive bowel gas pattern. No free intraperitoneal gas. Unchanged cardiomediastinal silhouette. There is no focal airspace disease. There is unchanged bilateral scarring/linear atelectasis. No large pleural effusion or visible pneumothorax. Bilateral shoulder degenerative changes. No acute osseous abnormality. IMPRESSION: Nonobstructive bowel gas pattern. No evidence of acute cardiopulmonary disease. Electronically Signed   By: Caprice Renshaw M.D.   On: 03/22/2021 17:56    Procedures Procedures  Medications Ordered in the ED Medications  alum & mag hydroxide-simeth (MAALOX/MYLANTA) 200-200-20 MG/5ML suspension 30 mL (30 mLs Oral Given 03/22/21 1834)    And  lidocaine (XYLOCAINE) 2 % viscous mouth solution 15 mL (15 mLs Oral Given 03/22/21  1834)     MDM Rules/Calculators/A&P MDM Patient with several recent ED visits for different complaints here primarily today for a 'choking sensation' in her throat after drinking some cough syrup. She does not have any signs of airway obstruction. No symptoms consistent with food impaction but she refuses to try to drink water here. I discussed that we can do some blood tests and an xray to rule out emergent process which she will agree to.   ED Course  I have reviewed the triage vital signs and the nursing notes.  Pertinent labs & imaging results that were available during my care of the patient were reviewed by me and considered in my medical decision making (see chart for details).  Clinical Course as of 03/22/21 1844  Sat Mar 22, 2021  1741 CBC is normal.  [CS]  1751 CMP is unremarkable.  [CS]  1801 AAS is unremarkable. No signs of pneumonia or other acute infection. No SBO.  [CS]  1840 Reviewed results with patient and family at bedside. She is able to swallow without regurgitation. Given GI cocktail to see if that would help her symptoms.  No emergent medical condition identified. She is scheduled for barium swallow later this month. Recommended she follow up with PCP for further evaluation of her other complaints. Rx Tessalon for her cough, vistaril for her anxiety. RTED for any other concerns.  [CS]    Clinical Course User Index [CS] Pollyann Savoy, MD    Final Clinical Impression(s) / ED Diagnoses Final diagnoses:  Cough  Abdominal pain  Dysphagia, unspecified type    Rx / DC Orders ED Discharge Orders          Ordered    benzonatate (TESSALON) 100 MG capsule  Every 8 hours        03/22/21 1843    hydrOXYzine (ATARAX/VISTARIL) 25  MG tablet  Every 8 hours PRN        03/22/21 1843             Pollyann Savoy, MD 03/22/21 [phone]

## 2021-03-22 NOTE — ED Notes (Signed)
Pt. Requested to cut off bra, Pt. didn't wanna take it off normally and felt like it was making it hard for her to breathe, wanted bra removed immediately. Pt. Husband present for entire process.

## 2021-03-22 NOTE — ED Triage Notes (Signed)
Visitor also reports recent medication change after being at alcohol treatment center. Reports they have stopped medications on their own r/t them making her fall.

## 2021-03-22 NOTE — ED Triage Notes (Signed)
Pt states that she has been coughing for about 45 minutes reports her husband gave her some PM coughing medication. Pt reports known history of esophageal stricture, pt has upcoming swallowing study scheduled. Pt reports trouble swallowing for the last month.

## 2021-03-28 ENCOUNTER — Ambulatory Visit (HOSPITAL_COMMUNITY)
Admission: RE | Admit: 2021-03-28 | Discharge: 2021-03-28 | Disposition: A | Discharge status: Home / Self Care | Payer: Medicare Other | Attending: Psychiatry | Admitting: Psychiatry

## 2021-03-28 ENCOUNTER — Emergency Department (HOSPITAL_COMMUNITY): Payer: Medicare Other

## 2021-03-28 ENCOUNTER — Other Ambulatory Visit: Payer: Self-pay

## 2021-03-28 ENCOUNTER — Emergency Department (HOSPITAL_COMMUNITY)
Admission: EM | Admit: 2021-03-28 | Discharge: 2021-03-29 | Disposition: A | Discharge status: Home / Self Care | Payer: Medicare Other | Attending: Emergency Medicine | Admitting: Emergency Medicine

## 2021-03-28 ENCOUNTER — Encounter (HOSPITAL_COMMUNITY): Payer: Self-pay | Admitting: *Deleted

## 2021-03-28 DIAGNOSIS — F329 Major depressive disorder, single episode, unspecified: Secondary | ICD-10-CM | POA: Diagnosis not present

## 2021-03-28 DIAGNOSIS — F29 Unspecified psychosis not due to a substance or known physiological condition: Secondary | ICD-10-CM | POA: Diagnosis not present

## 2021-03-28 DIAGNOSIS — Z79899 Other long term (current) drug therapy: Secondary | ICD-10-CM | POA: Insufficient documentation

## 2021-03-28 DIAGNOSIS — E039 Hypothyroidism, unspecified: Secondary | ICD-10-CM | POA: Insufficient documentation

## 2021-03-28 DIAGNOSIS — S60812A Abrasion of left wrist, initial encounter: Secondary | ICD-10-CM | POA: Insufficient documentation

## 2021-03-28 DIAGNOSIS — X789XXA Intentional self-harm by unspecified sharp object, initial encounter: Secondary | ICD-10-CM | POA: Diagnosis not present

## 2021-03-28 DIAGNOSIS — R45851 Suicidal ideations: Secondary | ICD-10-CM

## 2021-03-28 DIAGNOSIS — Z20822 Contact with and (suspected) exposure to covid-19: Secondary | ICD-10-CM | POA: Insufficient documentation

## 2021-03-28 DIAGNOSIS — Y901 Blood alcohol level of 20-39 mg/100 ml: Secondary | ICD-10-CM | POA: Diagnosis not present

## 2021-03-28 DIAGNOSIS — Z87891 Personal history of nicotine dependence: Secondary | ICD-10-CM | POA: Diagnosis not present

## 2021-03-28 DIAGNOSIS — F419 Anxiety disorder, unspecified: Secondary | ICD-10-CM | POA: Diagnosis not present

## 2021-03-28 DIAGNOSIS — R0602 Shortness of breath: Secondary | ICD-10-CM | POA: Diagnosis not present

## 2021-03-28 DIAGNOSIS — S6992XA Unspecified injury of left wrist, hand and finger(s), initial encounter: Secondary | ICD-10-CM | POA: Diagnosis present

## 2021-03-28 DIAGNOSIS — R4689 Other symptoms and signs involving appearance and behavior: Secondary | ICD-10-CM

## 2021-03-28 DIAGNOSIS — T1491XA Suicide attempt, initial encounter: Secondary | ICD-10-CM

## 2021-03-28 DIAGNOSIS — F1021 Alcohol dependence, in remission: Secondary | ICD-10-CM | POA: Diagnosis not present

## 2021-03-28 DIAGNOSIS — F1010 Alcohol abuse, uncomplicated: Secondary | ICD-10-CM

## 2021-03-28 LAB — BRAIN NATRIURETIC PEPTIDE: B Natriuretic Peptide: 20.7 pg/mL (ref 0.0–100.0)

## 2021-03-28 LAB — RESP PANEL BY RT-PCR (FLU A&B, COVID) ARPGX2
Influenza A by PCR: NEGATIVE
Influenza B by PCR: NEGATIVE
SARS Coronavirus 2 by RT PCR: NEGATIVE

## 2021-03-28 LAB — ACETAMINOPHEN LEVEL: Acetaminophen (Tylenol), Serum: <10 ug/mL — ABNORMAL LOW (ref 10–30)

## 2021-03-28 LAB — CBC WITH DIFFERENTIAL/PLATELET
Abs Immature Granulocytes: 0.01 10*3/uL (ref 0.00–0.07)
Basophils Absolute: 0.1 10*3/uL (ref 0.0–0.1)
Basophils Relative: 1 %
Eosinophils Absolute: 0.2 10*3/uL (ref 0.0–0.5)
Eosinophils Relative: 2 %
HCT: 39.7 % (ref 36.0–46.0)
Hemoglobin: 13 g/dL (ref 12.0–15.0)
Immature Granulocytes: 0 %
Lymphocytes Relative: 40 %
Lymphs Abs: 3.1 10*3/uL (ref 0.7–4.0)
MCH: 30.8 pg (ref 26.0–34.0)
MCHC: 32.7 g/dL (ref 30.0–36.0)
MCV: 94.1 fL (ref 80.0–100.0)
Monocytes Absolute: 0.9 10*3/uL (ref 0.1–1.0)
Monocytes Relative: 11 %
Neutro Abs: 3.6 10*3/uL (ref 1.7–7.7)
Neutrophils Relative %: 46 %
Platelets: 251 10*3/uL (ref 150–400)
RBC: 4.22 MIL/uL (ref 3.87–5.11)
RDW: 12.3 % (ref 11.5–15.5)
WBC: 7.8 10*3/uL (ref 4.0–10.5)
nRBC: 0 % (ref 0.0–0.2)

## 2021-03-28 LAB — COMPREHENSIVE METABOLIC PANEL
ALT: 30 U/L (ref 0–44)
AST: 27 U/L (ref 15–41)
Albumin: 3.7 g/dL (ref 3.5–5.0)
Alkaline Phosphatase: 126 U/L (ref 38–126)
Anion gap: 11 (ref 5–15)
BUN: 13 mg/dL (ref 8–23)
CO2: 22 mmol/L (ref 22–32)
Calcium: 9 mg/dL (ref 8.9–10.3)
Chloride: 108 mmol/L (ref 98–111)
Creatinine, Ser: 0.61 mg/dL (ref 0.44–1.00)
GFR, Estimated: >60 mL/min (ref >60)
Glucose, Bld: 98 mg/dL (ref 70–99)
Potassium: 4 mmol/L (ref 3.5–5.1)
Sodium: 141 mmol/L (ref 135–145)
Total Bilirubin: 0.5 mg/dL (ref 0.3–1.2)
Total Protein: 5.9 g/dL — ABNORMAL LOW (ref 6.5–8.1)

## 2021-03-28 LAB — ETHANOL: Alcohol, Ethyl (B): 21 mg/dL — ABNORMAL HIGH (ref <10)

## 2021-03-28 LAB — SALICYLATE LEVEL: Salicylate Lvl: <7 mg/dL — ABNORMAL LOW (ref 7.0–30.0)

## 2021-03-28 LAB — TROPONIN I (HIGH SENSITIVITY): Troponin I (High Sensitivity): 16 ng/L (ref <18)

## 2021-03-28 MED ORDER — ACETAMINOPHEN 325 MG PO TABS: 650.0000 mg | ORAL_TABLET | ORAL | Status: DC | PRN

## 2021-03-28 NOTE — ED Provider Notes (Signed)
Emergency Medicine Provider Triage Evaluation Note  Megan Singleton , a 70 y.o. female  was evaluated in triage.  Pt complains of shortness of breath and chest pain.  She states that this has been ongoing for 2 to 3 months.  [facility] and sent here for further evaluation. During attempts by nurse to triage her patient through her glasses at the nurse.  She became very aggressive and agitated, yelling, uncooperative.  She stated that she wanted to slit her wrists or kill herself, and wanted to die.  She began walking towards me aggressively when I originally attempt to examine her and stated she wanted to leave.  Given her statements about wanting to die and wanting to kill her self along with reports from [facility] that she drank alcohol earlier she appears to be a threat to herself based on her statements, and others, based on her actions in triage  She does have shortness of breath and there is consideration for medical emergency also however she states she has had this worked up before and constantly been told that is fine.  When patient attempted to leave and became agitated, throwing things at the nurse and acting like she was going to strike staff and myself security was called.  IVC paperwork was filled out and completed, signed by Dr. Clois Comber and given to green pod secretary.  When I went to return to triage patient was tearful, stating please help me, she stated that she did not have any memory of what she did earlier, and would "behave"  Lungs CTAB.  Does become short of breath with ambulation, able to loudly yell with out difficulty.  Physical Exam  BP 114/85 (BP Location: Right Arm)   Pulse 82   Temp 98.9 F (37.2 C) (Oral)   Resp (!) 22   SpO2 100%    Medical Decision Making  Medically screening exam initiated at 7:47 PM.  Appropriate orders placed.  [name] was informed that the remainder of the evaluation will be completed  by another provider, this initial triage assessment does not replace that evaluation, and the importance of remaining in the ED until their evaluation is complete.     Cristina Gong, PA-C 03/28/21 2005    Charlynne Pander, MD 03/28/21 [phone]

## 2021-03-28 NOTE — ED Notes (Signed)
Ambulated pt to restroom  

## 2021-03-28 NOTE — ED Provider Notes (Signed)
[facility] EMERGENCY DEPARTMENT Provider Note   CSN: [id number] Arrival date & time: 03/28/21  1920     History Chief Complaint  Patient presents with   Shortness of Breath   Psychiatric Evaluation    Megan Singleton is a 70 y.o. female.  70 year old female with history of alcohol abuse, depression presents to the ER for psychiatric assessment.  Patient also concern for mild difficulty breathing over the past few months is unchanged.  No chest pain.  No fevers or chills.  Is taking her medications as prescribed.  Patient became angry during triage, threatening towards staff.  Throwing objects at staff.  Patient shouted that she wanted to kill her self and cut her wrist.  Aggressive posturing towards staff.  Patient was de-escalated.  Patient does report self injury to left wrist with a small superficial wound. Upon repeat assessment patient denies acute psychiatric complaints reports that she is "not a psych patient."  And is here to have her wrist examined.  Patient reports that she is not sure how her wrist was cut.  Denies any other associated injuries.  No chest pain or dyspnea. No cough, fevers or chills. Denies hallucinations or delusions.  No SI or HI.  The history is provided by the patient. No language interpreter was used.  Shortness of Breath Associated symptoms: no abdominal pain, no chest pain, no cough, no fever, no headaches, no rash and no vomiting       Past Medical History:  Diagnosis Date   Alcohol abuse    Allergy    Depression    Fibromyalgia    GERD (gastroesophageal reflux disease)    Hashimoto's thyroiditis    Hyperlipidemia    IBS (irritable bowel syndrome)    Osteopenia    Prolonged QT interval    RLS (restless legs syndrome)    Seizure disorder (HCC)    she is on life long medication per Neuro and can not have driving privileges if she  stops meds   Seizures (HCC)    Thyroid disease     Patient Active Problem List    Diagnosis Date Noted   Panic attacks 11/19/2014   Pap smear for cervical cancer screening 05/21/2014   Cough 09/27/2013   Thoracic back pain 08/18/2011   Conjunctivitis 07/16/2011   General medical examination 01/06/2011   DYSPAREUNIA 02/10/2010   VAGINITIS, ATROPHIC 02/10/2010   PILONIDAL CYST 02/10/2010   PNEUMONIA 01/16/2010   INCONTINENCE, MIXED, URGE/STRESS 01/16/2010   CHEST PAIN UNSPECIFIED 10/04/2009   MALAISE AND FATIGUE 09/10/2009   DIARRHEA, CHRONIC 09/10/2009   VISUAL CHANGES 06/10/2009   GANGLION CYST, WRIST, LEFT 06/10/2009   POSTMENOPAUSAL STATUS 06/10/2009   PLANTAR FASCIITIS 12/13/2008   RESTLESS LEGS SYNDROME 09/05/2008   Impacted cerumen 07/23/2008   DEPRESSION 07/04/2008   OTITIS EXTERNA 07/04/2008   VIRAL URI 07/04/2008   SWEATING 08/11/2007   Hyperlipidemia 08/02/2007   ALCOHOL ABUSE 08/02/2007   SINUSITIS- ACUTE-NOS 05/11/2007   ALLERGIC RHINITIS 05/11/2007   PAIN IN JOINT, HAND 03/09/2007   Hypothyroidism 12/15/2006   HEMORRHOIDS 12/15/2006   GERD 12/15/2006   MICROSCOPIC HEMATURIA 12/15/2006   OSTEOPENIA 12/15/2006   Seizure disorder (HCC) 12/15/2006    Past Surgical History:  Procedure Laterality Date   BUNIONECTOMY     Right and Hammertoe   CESAREAN SECTION  7782,4235   x's 2   ESOPHAGEAL MANOMETRY N/A 12/09/2016   Procedure: ESOPHAGEAL MANOMETRY (EM);  Surgeon: Megan Ching, Singleton;  Location: [facility];  Service: Endoscopy;  Laterality: N/A;   ESOPHAGOGASTRODUODENOSCOPY (EGD) WITH PROPOFOL N/A 05/23/2020   Procedure: ESOPHAGOGASTRODUODENOSCOPY (EGD) WITH PROPOFOL with Botox;  Surgeon: Megan Singleton, Parag, Singleton;  [facility];  Service: Gastroenterology;  Laterality: N/A;   FOOT SURGERY     Left--Neuroma, Bunion   LIPOSUCTION     abdominal   SUBMUCOSAL INJECTION  05/23/2020   Procedure: SUBMUCOSAL INJECTION;  Surgeon: Megan Singleton, Parag, Singleton;  Location: [facility];  Service: Gastroenterology;;   TONSILLECTOMY     as a child    WRIST SURGERY     Right wrist---Benign mass     OB History   No obstetric history on file.     Family History  Problem Relation Age of Onset   Breast cancer Mother    Hyperlipidemia Mother    Prostate cancer Father    Hyperlipidemia Father    Depression Father    Cancer Cousin        Colon Cancer   Diabetes Other        Maternal Grandparent   Kidney disease Other        Maternal Grandparent   Stroke Other        Grandparent   Heart disease Other        Grand Parent    Social History   Tobacco Use   Smoking status: Former    Types: Cigarettes    Quit date: 07/21/1979    Years since quitting: 41.7   Smokeless tobacco: Never  Vaping Use   Vaping Use: Never used  Substance Use Topics   Alcohol use: Not Currently    Comment: 1-2 drinks per night   Drug use: No    Home Medications Prior to Admission medications   Medication Sig Start Date End Date Taking? Authorizing Provider  benzonatate (TESSALON) 100 MG capsule Take 1 capsule (100 mg total) by mouth every 8 (eight) hours. 03/22/21   Megan Singleton  diltiazem (CARDIZEM) 30 MG tablet Take 30 mg by mouth 3 (three) times daily.    Megan Singleton  FLUoxetine (PROZAC) 40 MG capsule Take 40 mg by mouth every morning.    Megan Singleton  gabapentin (NEURONTIN) 250 MG/5ML solution Take 500 mg by mouth 3 (three) times daily. 05/13/20   Megan Singleton  hydrOXYzine (ATARAX/VISTARIL) 25 MG tablet Take 1 tablet (25 mg total) by mouth every 8 (eight) hours as needed for anxiety. 03/22/21   Megan Singleton  lamoTRIgine (LAMICTAL) 25 MG tablet Take 50 mg by mouth at bedtime.    Megan Singleton  levETIRAcetam (KEPPRA) 100 MG/ML solution Take 10 mLs by mouth in the morning and at bedtime. 05/10/20   Megan Singleton  levothyroxine (SYNTHROID) 125 MCG tablet Take 125 mcg by mouth daily before breakfast.    Megan Singleton  pantoprazole (PROTONIX) 40 MG tablet TAKE 1  TABLET BY MOUTH EVERY DAY Patient taking differently: Take 40 mg by mouth every morning.  10/05/15   Megan Singleton  potassium chloride 20 MEQ/15ML (10%) SOLN Take 15 mLs (20 mEq total) by mouth 2 (two) times daily for 7 days. 07/13/20 07/20/20  Molpus, John, Singleton  pramipexole (MIRAPEX) 1 MG tablet Take 3 mg by mouth at bedtime.     Megan Singleton  simvastatin (ZOCOR) 20 MG tablet TAKE 1 TABLET BY MOUTH EVERY NIGHT AT BEDTIME Patient taking differently: Take 20 mg by mouth at bedtime.  10/18/15   Megan Singleton  sucralfate (CARAFATE) 1 GM/10ML suspension  Take 10 mLs (1 g total) by mouth 4 (four) times daily -  with meals and at bedtime. 05/10/20   Palumbo, April, Singleton    Allergies    Morphine  Review of Systems   Review of Systems  Constitutional:  Negative for chills and fever.  HENT:  Negative for facial swelling and trouble swallowing.   Eyes:  Negative for photophobia and visual disturbance.  Respiratory:  Positive for shortness of breath. Negative for cough.   Cardiovascular:  Negative for chest pain and palpitations.  Gastrointestinal:  Negative for abdominal pain, nausea and vomiting.  Endocrine: Negative for polydipsia and polyuria.  Genitourinary:  Negative for difficulty urinating and hematuria.  Musculoskeletal:  Negative for gait problem and joint swelling.  Skin:  Negative for pallor and rash.  Neurological:  Negative for syncope and headaches.  Psychiatric/Behavioral:  Positive for agitation, behavioral problems and self-injury. Negative for confusion. The patient is nervous/anxious.    Physical Exam Updated Vital Signs BP 114/85 (BP Location: Right Arm)   Pulse 82   Temp 98.9 F (37.2 C) (Oral)   Resp (!) 22   Ht 5\' 4"  (1.626 m)   Wt 65.8 kg   SpO2 100%   BMI 24.89 kg/m   Physical Exam Vitals and nursing note reviewed.  Constitutional:      General: She is not in acute distress.    Appearance: Normal appearance.  HENT:     Head:  Normocephalic and atraumatic.     Right Ear: External ear normal.     Left Ear: External ear normal.     Nose: Nose normal.     Mouth/Throat:     Mouth: Mucous membranes are moist.  Eyes:     General: No scleral icterus.       Right eye: No discharge.        Left eye: No discharge.  Cardiovascular:     Rate and Rhythm: Normal rate and regular rhythm.     Pulses: Normal pulses.     Heart sounds: Normal heart sounds.  Pulmonary:     Effort: Pulmonary effort is normal. No respiratory distress.     Breath sounds: Normal breath sounds.  Abdominal:     General: Abdomen is flat.     Tenderness: There is no abdominal tenderness.  Musculoskeletal:        General: Normal range of motion.     Cervical back: Normal range of motion.     Right lower leg: No edema.     Left lower leg: No edema.  Skin:    General: Skin is warm and dry.     Capillary Refill: Capillary refill takes less than 2 seconds.       Neurological:     Mental Status: She is alert.  Psychiatric:        Attention and Perception: She is inattentive.        Mood and Affect: Mood normal. Affect is labile.        Speech: Speech is rapid and pressured.        Behavior: Behavior is agitated, aggressive, hyperactive and combative.    ED Results / Procedures / Treatments   Labs (all labs ordered are listed, but only abnormal results are displayed) Labs Reviewed  COMPREHENSIVE METABOLIC PANEL - Abnormal; Notable for the following components:      Result Value   Total Protein 5.9 (*)    All other components within normal limits  ETHANOL - Abnormal; Notable for the  following components:   Alcohol, Ethyl (Singleton) 21 (*)    All other components within normal limits  ACETAMINOPHEN LEVEL - Abnormal; Notable for the following components:   Acetaminophen (Tylenol), Serum <10 (*)    All other components within normal limits  SALICYLATE LEVEL - Abnormal; Notable for the following components:   Salicylate Lvl <7.0 (*)    All other  components within normal limits  RESP PANEL BY RT-PCR (FLU A&Singleton, COVID) ARPGX2  CBC WITH DIFFERENTIAL/PLATELET  BRAIN NATRIURETIC PEPTIDE  RAPID URINE DRUG SCREEN, HOSP PERFORMED  URINALYSIS, ROUTINE W REFLEX MICROSCOPIC  TROPONIN I (HIGH SENSITIVITY)  TROPONIN I (HIGH SENSITIVITY)    EKG None  Radiology DG Chest 2 View  Result Date: 03/28/2021 CLINICAL DATA:  Shortness of breath EXAM: CHEST - 2 VIEW COMPARISON:  03/20/2021 FINDINGS: No focal opacity or pleural effusion. Normal cardiomediastinal silhouette with aortic atherosclerosis. No pneumothorax. IMPRESSION: No active cardiopulmonary disease. Electronically Signed   By: Jasmine Pang M.D.   On: 03/28/2021 20:31    Procedures Procedures   Medications Ordered in ED Medications  acetaminophen (TYLENOL) tablet 650 mg (has no administration in time range)    ED Course  I have reviewed the triage vital signs and the nursing notes.  Pertinent labs & imaging results that were available during my care of the patient were reviewed by me and considered in my medical decision making (see chart for details).  Clinical Course as of 03/28/21 2341  Fri Mar 28, 2021  2001 Dr. Silverio Lay signed IVC papers [EH]    Clinical Course User Index [EH] Norman Clay   MDM Rules/Calculators/A&P                           This patient complains of suicidal ideation; this involves an extensive number of treatment options and is a complaint that carries with it a high risk of complications and morbidity.  Vital signs reviewed.  Serious etiologies considered.   Patient does appear acutely psychotic, she is a danger to herself.  Self injury.  Suicidal thoughts.  No active plan for suicide.  IVC paperwork completed.  Labs reviewed.  Patient with elevated ethanol level.  Otherwise labs are nonacute.  Chest x-ray is nonacute.  Patient does not appear to be in acute alcohol withdrawal.  She does report recent alcohol use.   Patient is medically  clear for psychiatric evaluation.  Patient placed in psychiatric  hold pending psychiatric evaluation.  Patient signed out to incoming physician pending final disposition.   Final Clinical Impression(s) / ED Diagnoses Final diagnoses:  Aggressive behavior  Suicidal ideation  Suicidal behavior with attempted self-injury [facility])  Alcohol abuse    Rx / DC Orders ED Discharge Orders     None        Sloan Leiter, DO 03/28/21 2341

## 2021-03-28 NOTE — ED Notes (Signed)
3 copies of I[facility] paper work given to [employer] on desk. Original copy in red folder, one in medical records

## 2021-03-28 NOTE — ED Notes (Signed)
Security wand pt. Pt changed to purple scrubs.

## 2021-03-28 NOTE — Discharge Instructions (Addendum)
Outpatient treatment is recommended. Also, please follow up with Dr. Evelene Croon regarding any adjustments with psychiatric medications.   [facility]: Provides in home counseling for seniors in [place] and [place] counties.   [address],  [place], [place] [address] Phone: [phone]  Substance Abuse Counseling is also recommended:  [facility]- offers outpatient substance abuse counseling [address],  [place], [place] [address] Phone: [phone]

## 2021-03-28 NOTE — ED Triage Notes (Signed)
Pt arrived with EMS for sob ongoing for 2-3 months, worsening today. EMS gave nitro x1 with mild improvement. Pt currently denies chest pain. 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 Pt informed EMS that she was going to cut her wrist "to get more attention." When asked about SI, pt initially reported no then stated she "just doesn't want to live anymore." Difficult to finish triage on suicidal ideations due to agitation

## 2021-03-28 NOTE — ED Notes (Signed)
Provided pt water.  °

## 2021-03-28 NOTE — ED Notes (Signed)
Ambulated pt to restroom to change underwear.

## 2021-03-28 NOTE — H&P (Signed)
Behavioral Health Medical Screening Exam  Megan Singleton is an 70 y.o. female who presented to [facility] voluntarily as a walk-in for assessment of anxiety, guilt, suicidal thoughts, irritability/anger per form. On assessment patient presented with slurred speech, impaired cognition and gait, complaining of shortness of breath and chest pain. Patient reports drinking prior to arriving; states she was bought in by husband who was not located by staff in lobby. Patient does not appear to be in any acute distress. Color consistent with complexion/race. Chest rise/fall equal. Lungs clear on auscultation. Heart rate elevated. Emergency transportation called. Patient transported to [facility] for medical clearance; Tegeler, MD notified.    Total Time spent with patient: 15 minutes  Psychiatric Specialty Exam: Physical Exam Vitals and nursing note reviewed.  Constitutional:      Appearance: She is normal weight.     Comments: intoxicated  HENT:     Head: Normocephalic.     Nose: Nose normal.     Mouth/Throat:     Mouth: Mucous membranes are moist.     Pharynx: Oropharynx is clear.  Cardiovascular:     Rate and Rhythm: Tachycardia present.     Pulses: Normal pulses.  Pulmonary:     Breath sounds: Normal breath sounds.  Abdominal:     General: Bowel sounds are normal.  Musculoskeletal:        General: Normal range of motion.     Cervical back: Normal range of motion.  Skin:    General: Skin is warm and dry.  Neurological:     Mental Status: She is disoriented.     Coordination: Coordination abnormal.     Gait: Gait abnormal.  Psychiatric:        Attention and Perception: She is inattentive.        Mood and Affect: Affect is labile.        Speech: Speech is slurred.        Behavior: Behavior is uncooperative and agitated.        Thought Content: Thought content is delusional. Thought content does not include homicidal or suicidal ideation. Thought content does not include homicidal or suicidal  plan.        Cognition and Memory: Cognition is impaired. Memory is impaired.        Judgment: Judgment is impulsive and inappropriate.     Comments: Patient actively intoxicated; reports drinking prior to arrival   Review of Systems  Respiratory:  Positive for chest tightness and shortness of breath.   Psychiatric/Behavioral:  Positive for behavioral problems.   All other systems reviewed and are negative. There were no vitals taken for this visit.There is no height or weight on file to calculate BMI. General Appearance: Disheveled Eye Contact:  Minimal Speech:  Slurred Volume:  Normal Mood:  Anxious Affect:  Non-Congruent Thought Process:  Disorganized Orientation:  Other:  person only Thought Content:  Illogical Suicidal Thoughts:  No Homicidal Thoughts:  No Memory:  Immediate;   Poor Recent;   Poor Judgement:  Impaired Insight:  Lacking Psychomotor Activity:  Increased Concentration: Concentration: Poor and Attention Span: Poor Recall:  Poor Fund of Knowledge: unable to fully assess due to intoxication Language: Poor Akathisia:  NA Handed:   AIMS (if indicated):    Assets:  Financial Resources/Insurance Intimacy Resilience Social Support Sleep:     Musculoskeletal: Strength & Muscle Tone: decreased Gait & Station: unsteady Patient leans: N/A  There were no vitals taken for this visit.  Recommendations: Based on my evaluation the patient  appears to have an emergency medical condition for which I recommend the patient be transferred to the emergency department for further evaluation. Paitent transferred to [facility] for further observation, stabilization, and treatment.   Loletta Parish, NP 03/28/2021, 7:17 PM

## 2021-03-28 NOTE — ED Notes (Signed)
Pt has a 2" laceration on left wrist. Pt states she cuts her wrist to get medical attention because she doesn't believe anyone will listen to her.

## 2021-03-29 LAB — URINALYSIS, ROUTINE W REFLEX MICROSCOPIC
Bilirubin Urine: NEGATIVE
Glucose, UA: NEGATIVE mg/dL
Hgb urine dipstick: NEGATIVE
Ketones, ur: NEGATIVE mg/dL
Leukocytes,Ua: NEGATIVE
Nitrite: NEGATIVE
Protein, ur: NEGATIVE mg/dL
Specific Gravity, Urine: 1.015 (ref 1.005–1.030)
pH: 8 (ref 5.0–8.0)

## 2021-03-29 LAB — RAPID URINE DRUG SCREEN, HOSP PERFORMED
Amphetamines: NOT DETECTED
Barbiturates: NOT DETECTED
Benzodiazepines: POSITIVE — AB
Cocaine: NOT DETECTED
Opiates: NOT DETECTED
Tetrahydrocannabinol: NOT DETECTED

## 2021-03-29 MED ORDER — LORAZEPAM 1 MG PO TABS
2.0000 mg | ORAL_TABLET | Freq: Once | ORAL | Status: AC
  Administered 2021-03-29: 2 mg via ORAL
  Filled 2021-03-29: qty 2

## 2021-03-29 NOTE — ED Notes (Signed)
Belongings given back to patient. Patient signed belongings paperwork.

## 2021-03-29 NOTE — ED Notes (Signed)
Updated patient's spouse on dispo. Spouse to pick up patient; ETA about 30 minutes.

## 2021-03-29 NOTE — BH Assessment (Signed)
Comprehensive Clinical Assessment (CCA) Screening, Triage and Referral Note  03/29/2021 LULIA SCHRINER [id number]  Disposition: Per Ophelia Shoulder, NP patient does not meet inpatient criteria. Outpatient treatment is recommended.  Information for [facility] is included in the AVS to be provided to pt upon d/c.   The patient demonstrates the following risk factors for suicide: Chronic risk factors for suicide include: psychiatric disorder of anxiety, depression and hx of Alcohol Use Disorder and previous self-harm hx of attempt 20 yrs ago, cut yesterday, however does not recall cutting self and this may have been inadvertent . Acute risk factors for suicide include: social withdrawal/isolation and loss (financial, interpersonal, professional). Protective factors for this patient include: positive social support, positive therapeutic relationship, responsibility to others (children, family), coping skills, and hope for the future. Considering these factors, the overall suicide risk at this point appears to be low. Patient is appropriate for outpatient follow up.  Patient is a 70 year old female with a history of anxiety, depression and Alcohol Use Disorder who presents voluntarily, initially to [facility].  She reported symptoms of anxiety, guilt, depression, weakness,  CP and SOB.  Due to medical concerns, she was transferred to [facility] for medical evaluation.  She has since been medically cleared and cleared for [facility] Assessment.   Upon assessment, patient is pleasant and cooperative.  She recalls feeling weak, anxious, with chest tightness. Patient denies depressive symptoms and reports feeling improved. She denied SI, HI and AVH.  Upon discussion of BAL, patient states she had one drink yesterday.  She reports history of alcohol abuse, however states she has been sober for several weeks following inpatient treatment at [facility] 3 wks ago. Patient denies any other concerns at this time.  She gives  verbal consent for LPC to contact her husband, Jillyn Hidden.   Patient's husband Jillyn Hidden confirms patient was admitted to [facility] several weeks ago.  He reports patient has not been drinking for the past few weeks, however she did have more than she reports yesterday.  He is mostly concerned about patients constant complaints of SOB, chest pain and even reports she feels she is choking at times.  After multiple ED visits and PCP exams, there are no medical concerns.  She states he isn't sure what else to do, especially when she reports she feels she is choking.  He denies safety concerns, outside of mentioning she cut herself yesterday.  He was informed that patient does not recall intentionally trying to harm herself and she is denying current SI, intent or planning.  Informed patient's husband that she is not currently meeting inpatient criteria.  Discussed treatment options, to include [facility] in home therapy.  He requested their information and agrees to ensure patient follows up with Dr. Evelene Croon regarding any medication adjustments/changes.    Chief Complaint:  Chief Complaint  Patient presents with   Shortness of Breath   Psychiatric Evaluation   Visit Diagnosis: Depressive Disorder Unspecified                             Anxiety Disorder Unspecified                             Alcohol Use Disorder, in remission until relapse yesterday Flowsheet Row ED from 03/28/2021 in [facility] EMERGENCY DEPARTMENT  Thoughts that you would be better off dead, or of hurting yourself in  some way Not at all  PHQ-9 Total Score 5      Flowsheet Row ED from 03/28/2021 in [facility] EMERGENCY DEPARTMENT ED from 03/22/2021 in [facility] EMERGENCY DEPARTMENT ED from 03/20/2021 in [facility]PT  C-SSRS RISK CATEGORY Error: Q3, 4, or 5 should not be populated when Q2 is No No Risk No Risk     Form did not populate results -  Risk=Low  Patient Reported Information How did you hear about [place]? Other (Comment) [facility] referred for med clearance)  What Is the Reason for Your Visit/Call Today? Patient presented initially to [facility] with c/o anxiety, guilt, SOB and CP.  She was referred to the ED for medical clearance and has since been cleared.  She is denying SI, HI and AVH.  She admits to having a drink yesterday, however does not feel this is a problem at this time, especially after she recently completed detox/treatment at [facility].  How Long Has This Been Causing You Problems? 1 wk - 1 month  What Do You Feel Would Help You the Most Today? Treatment for Depression or other mood problem   Have You Recently Had Any Thoughts About Hurting Yourself? No  Are You Planning to Commit Suicide/Harm Yourself At This time? No   Have you Recently Had Thoughts About Hurting Someone Karolee Ohs? No  Are You Planning to Harm Someone at This Time? No  Explanation: No data recorded  Have You Used Any Alcohol or Drugs in the Past 24 Hours? Yes  How Long Ago Did You Use Drugs or Alcohol? No data recorded What Did You Use and How Much? Patient reports having one drink yesterday.   Do You Currently Have a Therapist/Psychiatrist? Yes  Name of Therapist/Psychiatrist: Patient sees Dr. Evelene Croon for med management.   Have You Been Recently Discharged From Any Office Practice or Programs? No  Explanation of Discharge From Practice/Program: No data recorded   CCA Screening Triage Referral Assessment Type of Contact: Face-to-Face  Telemedicine Service Delivery:   Is this Initial or Reassessment? No data recorded Date Telepsych consult ordered in [facility]:  No data recorded Time Telepsych consult ordered in [facility]:  No data recorded Location of Assessment: [facility] ED  Provider Location: [facility]   Collateral Involvement: Patient's husband, Jillyn Hidden, provided collateral.   Does Patient Have a Court Appointed Legal Guardian? No  data recorded Name and Contact of Legal Guardian: No data recorded If Minor and Not Living with Parent(s), Who has Custody? No data recorded Is CPS involved or ever been involved? Never  Is APS involved or ever been involved? Never   Patient Determined To Be At Risk for Harm To Self or Others Based on Review of Patient Reported Information or Presenting Complaint? No data recorded Method: No data recorded Availability of Means: No data recorded Intent: No data recorded Notification Required: No data recorded Additional Information for Danger to Others Potential: No data recorded Additional Comments for Danger to Others Potential: No data recorded Are There Guns or Other Weapons in Your Home? No data recorded Types of Guns/Weapons: No data recorded Are These Weapons Safely Secured?                            No data recorded Who Could Verify You Are Able To Have These Secured: No data recorded Do You Have any Outstanding Charges, Pending Court Dates, Parole/Probation? No data recorded Contacted To Inform  of Risk of Harm To Self or Others: No data recorded  Does Patient Present under Involuntary Commitment? No  IVC Papers Initial File Date: No data recorded  [place] of Residence: [place]   Patient Currently Receiving the Following Services: Medication Management   Determination of Need: Routine (7 days)   Options For Referral: Outpatient Therapy   Discharge Disposition:     Yetta Glassman, [facility]

## 2021-03-29 NOTE — ED Notes (Signed)
Patient ambulated to the bathroom with no issue. 

## 2021-03-29 NOTE — ED Notes (Signed)
Patient changed into personal clothing; awaiting ride at this time.

## 2021-03-29 NOTE — BH Assessment (Signed)
RN states Pt has received medication and is currently too somnolent to participate in assessment.   Pamalee Leyden, [facility], [facility] Triage Specialist [phone]

## 2021-03-29 NOTE — ED Provider Notes (Signed)
I was asked to assess patient at bedside by RN  I briefly assessed patient in hallway she is attempting to leave she states that if given something to help her relax she would be willing to stay.  We will provide her with 2 mg of p.o. Ativan.  Patient is under IVC   Megan Singleton, [place] 03/29/21 [id number]    Gloris Manchester, MD 03/30/21 [phone]

## 2021-03-29 NOTE — ED Provider Notes (Addendum)
Emergency Medicine Observation Re-evaluation Note  Megan Singleton is a 70 y.o. female, seen on rounds today.  Pt initially presented to the ED for complaints of Shortness of Breath and Psychiatric Evaluation Currently, the patient is resting comfortably, in NAD.  Physical Exam  BP 114/85 (BP Location: Right Arm)   Pulse 82   Temp 98.9 F (37.2 C) (Oral)   Resp (!) 22   Ht 5\' 4"  (1.626 m)   Wt 65.8 kg   SpO2 100%   BMI 24.89 kg/m  Physical Exam General: resting comfortably, alert, in NAD Lungs: Respirations even and unlabored Psych: No acute distress  ED Course / MDM  EKG:   I have reviewed the labs performed to date as well as medications administered while in observation.  Recent changes in the last 24 hours include TTS attempted to evaluate the patient, too somnolent after medications, plan for re-eval in the am.  Plan  Current plan is TTS re-eval this AM.  is under involuntary commitment.  11:27 AM Following evaluation by TTS, the patient was subsequently determined to not meet inpatient criteria.  Outpatient psychiatric resources were placed in the patient's AVS.  Her IVC was rescinded and she was subsequently discharged.[name], MD 03/29/21 1009    05/29/21, MD 03/29/21 [id number]

## 2021-03-29 NOTE — ED Notes (Signed)
Pt cleared by psych. MD signed IVC rescind paperwork. Therapist, nutritional to [employer].

## 2021-03-29 NOTE — ED Notes (Signed)
Pt ambulated to restroom. 

## 2021-03-29 NOTE — ED Notes (Signed)
Psychiatry at bedside for psych evaluation.

## 2021-03-29 NOTE — ED Notes (Signed)
Patient escorted to vehicle and left with spouse at this time.

## 2021-03-29 NOTE — ED Notes (Signed)
Patient roaming around hallway and nurse's station. Patient instructed to remain in bed or nearby chair.

## 2021-04-01 ENCOUNTER — Ambulatory Visit (INDEPENDENT_AMBULATORY_CARE_PROVIDER_SITE_OTHER): Payer: Medicare Other | Admitting: Psychologist

## 2021-04-01 DIAGNOSIS — F411 Generalized anxiety disorder: Secondary | ICD-10-CM | POA: Diagnosis not present

## 2022-05-01 ENCOUNTER — Emergency Department (HOSPITAL_BASED_OUTPATIENT_CLINIC_OR_DEPARTMENT_OTHER): Payer: Medicare Other

## 2022-05-01 ENCOUNTER — Other Ambulatory Visit: Payer: Self-pay

## 2022-05-01 ENCOUNTER — Encounter (HOSPITAL_BASED_OUTPATIENT_CLINIC_OR_DEPARTMENT_OTHER): Payer: Self-pay

## 2022-05-01 ENCOUNTER — Emergency Department (HOSPITAL_BASED_OUTPATIENT_CLINIC_OR_DEPARTMENT_OTHER)
Admission: EM | Admit: 2022-05-01 | Discharge: 2022-05-01 | Disposition: A | Discharge status: Home / Self Care | Payer: Medicare Other | Attending: Emergency Medicine | Admitting: Emergency Medicine

## 2022-05-01 DIAGNOSIS — R1013 Epigastric pain: Secondary | ICD-10-CM | POA: Diagnosis not present

## 2022-05-01 DIAGNOSIS — R0602 Shortness of breath: Secondary | ICD-10-CM | POA: Diagnosis not present

## 2022-05-01 DIAGNOSIS — R0789 Other chest pain: Secondary | ICD-10-CM

## 2022-05-01 DIAGNOSIS — R110 Nausea: Secondary | ICD-10-CM | POA: Diagnosis not present

## 2022-05-01 DIAGNOSIS — R079 Chest pain, unspecified: Secondary | ICD-10-CM | POA: Diagnosis present

## 2022-05-01 LAB — BASIC METABOLIC PANEL
Anion gap: 7 (ref 5–15)
BUN: 16 mg/dL (ref 8–23)
CO2: 27 mmol/L (ref 22–32)
Calcium: 9.3 mg/dL (ref 8.9–10.3)
Chloride: 106 mmol/L (ref 98–111)
Creatinine, Ser: 0.61 mg/dL (ref 0.44–1.00)
GFR, Estimated: >60 mL/min (ref >60)
Glucose, Bld: 104 mg/dL — ABNORMAL HIGH (ref 70–99)
Potassium: 3.4 mmol/L — ABNORMAL LOW (ref 3.5–5.1)
Sodium: 140 mmol/L (ref 135–145)

## 2022-05-01 LAB — HEPATIC FUNCTION PANEL
ALT: 39 U/L (ref 0–44)
AST: 41 U/L (ref 15–41)
Albumin: 3.9 g/dL (ref 3.5–5.0)
Alkaline Phosphatase: 80 U/L (ref 38–126)
Bilirubin, Direct: 0.1 mg/dL (ref 0.0–0.2)
Indirect Bilirubin: 0.1 mg/dL — ABNORMAL LOW (ref 0.3–0.9)
Total Bilirubin: 0.2 mg/dL — ABNORMAL LOW (ref 0.3–1.2)
Total Protein: 6.7 g/dL (ref 6.5–8.1)

## 2022-05-01 LAB — CBC
HCT: 39.7 % (ref 36.0–46.0)
Hemoglobin: 13.8 g/dL (ref 12.0–15.0)
MCH: 31.3 pg (ref 26.0–34.0)
MCHC: 34.8 g/dL (ref 30.0–36.0)
MCV: 90 fL (ref 80.0–100.0)
Platelets: 174 10*3/uL (ref 150–400)
RBC: 4.41 MIL/uL (ref 3.87–5.11)
RDW: 12.9 % (ref 11.5–15.5)
WBC: 5.5 10*3/uL (ref 4.0–10.5)
nRBC: 0 % (ref 0.0–0.2)

## 2022-05-01 LAB — TROPONIN I (HIGH SENSITIVITY)
Troponin I (High Sensitivity): 3 ng/L (ref <18)
Troponin I (High Sensitivity): 3 ng/L (ref <18)

## 2022-05-01 LAB — D-DIMER, QUANTITATIVE: D-Dimer, Quant: 0.52 ug/mL-FEU — ABNORMAL HIGH (ref 0.00–0.50)

## 2022-05-01 LAB — LIPASE, BLOOD: Lipase: 35 U/L (ref 11–51)

## 2022-05-01 MED ORDER — ASPIRIN 81 MG PO CHEW
324.0000 mg | CHEWABLE_TABLET | Freq: Once | ORAL | Status: AC
  Administered 2022-05-01: 324 mg via ORAL
  Filled 2022-05-01: qty 4

## 2022-05-01 MED ORDER — SUCRALFATE 1 G PO TABS: 1.0000 g | ORAL_TABLET | Freq: Three times a day (TID) | ORAL | 0 refills | Status: DC

## 2022-05-01 MED ORDER — IOHEXOL 350 MG/ML SOLN
100.0000 mL | Freq: Once | INTRAVENOUS | Status: AC | PRN
  Administered 2022-05-01: 100 mL via INTRAVENOUS

## 2022-05-01 MED ORDER — ALUM & MAG HYDROXIDE-SIMETH 200-200-20 MG/5ML PO SUSP
30.0000 mL | Freq: Once | ORAL | Status: AC
  Administered 2022-05-01: 30 mL via ORAL
  Filled 2022-05-01: qty 30

## 2022-05-01 NOTE — ED Notes (Signed)
Pt reports prior to Pain starting she had been working on [employer] , went inside to wash hand. Pain began after lifting right arm

## 2022-05-01 NOTE — ED Provider Notes (Signed)
[facility] EMERGENCY DEPARTMENT Provider Note   CSN: [id number] Arrival date & time: 05/01/22  1151     History  Chief Complaint  Patient presents with   Chest Pain    Megan Singleton is a 71 y.o. female.  Patient with a history of alcoholic use, GERD, seizure disorder, hyperlipidemia, fibromyalgia, restless leg syndrome presenting with chest pain and upper back pain.  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  Pain is started to her right armpit and radiated to her chest neck and upper back.  Describes pain to her right chest wall, right axilla, right upper back near her scapula that radiates to her neck.  Pain has been constant for the past 45 minutes.  It is somewhat worse with movement of her arm and worse with palpation.  She has not had this kind of pain previously.  Some shortness of breath and nausea.  No vomiting.  Having some upper abdominal pain as well.  Does have a history of acid reflux and ulcers. Denies any cardiac history.  She is never had a stress test.  Pain somewhat worse with movement and worse with breathing.  No focal weakness, numbness or tingling.  The history is provided by the patient.  Chest Pain Associated symptoms: abdominal pain, back pain, nausea and shortness of breath   Associated symptoms: no fever, no vomiting and no weakness        Home Medications Prior to Admission medications   Medication Sig Start Date End Date Taking? Authorizing Provider  benzonatate (TESSALON) 100 MG capsule Take 1 capsule (100 mg total) by mouth every 8 (eight) hours. Patient not taking: Reported on 03/29/2021 03/22/21   Pollyann Savoy, MD  diltiazem (CARDIZEM) 30 MG tablet Take 30 mg by mouth 3 (three) times daily.    [provider]  gabapentin (NEURONTIN) 600 MG tablet Take 1,200 mg by mouth at bedtime.    [provider]  hydrOXYzine  (ATARAX/VISTARIL) 25 MG tablet Take 1 tablet (25 mg total) by mouth every 8 (eight) hours as needed for anxiety. Patient not taking: Reported on 03/29/2021 03/22/21   Pollyann Savoy, MD  levETIRAcetam (KEPPRA) 100 MG/ML solution Take 10 mLs by mouth in the morning and at bedtime. 05/10/20   [provider]  levothyroxine (SYNTHROID) 125 MCG tablet Take 125 mcg by mouth daily before breakfast.    [provider]  pantoprazole (PROTONIX) 40 MG tablet TAKE 1 TABLET BY MOUTH EVERY DAY Patient taking differently: Take 40 mg by mouth daily as needed (acid reflux). 10/05/15   Sheliah Hatch, MD  simvastatin (ZOCOR) 20 MG tablet TAKE 1 TABLET BY MOUTH EVERY NIGHT AT BEDTIME Patient taking differently: Take 20 mg by mouth in the morning. 10/18/15   Sheliah Hatch, MD      Allergies    Morphine    Review of Systems   Review of Systems  Constitutional:  Negative for activity change, appetite change and fever.  Respiratory:  Positive for chest tightness and shortness of breath.   Cardiovascular:  Positive for chest pain.  Gastrointestinal:  Positive for abdominal pain and nausea. Negative for vomiting.  Genitourinary:  Negative for dysuria and hematuria.  Musculoskeletal:  Positive for back pain. Negative for arthralgias.  Neurological:  Negative for weakness.    all other systems are negative except as noted in the HPI and PMH.  Physical Exam Updated Vital Signs BP 130/65 (BP Location: Right Arm)   Pulse 75   Temp 98 F (36.7 C) (Oral)   Resp 20   Ht 5\' 4"  (1.626 m)   Wt 65.8 kg   SpO2 98%   BMI 24.89 kg/m  Physical Exam Vitals and nursing note reviewed.  Constitutional:      General: She is not in acute distress.    Appearance: She is well-developed.     Comments: Uncomfortable  HENT:     Head: Normocephalic and atraumatic.     Mouth/Throat:     Pharynx: No oropharyngeal exudate.  Eyes:     Conjunctiva/sclera: Conjunctivae normal.     Pupils: Pupils  are equal, round, and reactive to light.  Neck:     Comments: No meningismus. Cardiovascular:     Rate and Rhythm: Normal rate and regular rhythm.     Heart sounds: Normal heart sounds. No murmur heard. Pulmonary:     Effort: Pulmonary effort is normal. No respiratory distress.     Breath sounds: Normal breath sounds.     Comments: Right chest wall tenderness, worse with right arm movement. Chest:     Chest wall: Tenderness present.  Abdominal:     Palpations: Abdomen is soft.     Tenderness: There is abdominal tenderness. There is no guarding or rebound.     Comments: Epigastric and right upper quadrant tenderness, no guarding or rebound  Musculoskeletal:        General: Tenderness present. Normal range of motion.     Cervical back: Normal range of motion and neck supple.     Comments: Paraspinal thoracic tenderness near scapula.  Skin:    General: Skin is warm.  Neurological:     Mental Status: She is alert and oriented to person, place, and time.     Cranial Nerves: No cranial nerve deficit.     Motor: No abnormal muscle tone.     Coordination: Coordination normal.     Comments:  5/5 strength throughout. CN 2-12 intact.Equal grip strength.   Psychiatric:        Behavior: Behavior normal.     ED Results / Procedures / Treatments   Labs (all labs ordered are listed, but only abnormal results are displayed) Labs Reviewed  BASIC METABOLIC PANEL - Abnormal; Notable for the following components:      Result Value   Potassium 3.4 (*)    Glucose, Bld 104 (*)    All other components within normal limits  HEPATIC FUNCTION PANEL - Abnormal; Notable for the following components:   Total Bilirubin 0.2 (*)    Indirect Bilirubin 0.1 (*)    All other components within normal limits  D-DIMER, QUANTITATIVE (NOT AT [facility]) - Abnormal; Notable for the following components:   D-Dimer, Quant 0.52 (*)    All other components within normal limits  CBC  LIPASE, BLOOD  TROPONIN I (HIGH  SENSITIVITY)  TROPONIN I (HIGH SENSITIVITY)    EKG EKG Interpretation  Date/Time:  Friday May 01 2022 12:00:33 EDT Ventricular Rate:  77 PR Interval:  154 QRS Duration: 84 QT Interval:  382 QTC Calculation: 433 R Axis:   44 Text Interpretation: Sinus rhythm No significant change was found Confirmed by Ezequiel Essex [phone]) on 05/01/2022 12:02:14 PM  Radiology CT Angio Chest/Abd/Pel for Dissection W and/or Wo Contrast  Result Date: 05/01/2022 CLINICAL DATA:  Chest pain radiating to the right shoulder pain. EXAM: CT ANGIOGRAPHY CHEST, ABDOMEN AND PELVIS TECHNIQUE: Non-contrast  CT of the chest was initially obtained. Multidetector CT imaging through the chest, abdomen and pelvis was performed using the standard protocol during bolus administration of intravenous contrast. Multiplanar reconstructed images and MIPs were obtained and reviewed to evaluate the vascular anatomy. RADIATION DOSE REDUCTION: This exam was performed according to the departmental dose-optimization program which includes automated exposure control, adjustment of the mA and/or kV according to patient size and/or use of iterative reconstruction technique. CONTRAST:  OMNIPAQUE IOHEXOL 350 MG/ML SOLN COMPARISON:  Same day chest radiograph abdominal radiographs 03/22/2022, CT abdomen/pelvis 02/21/2019, CTA chest 01/08/2010 FINDINGS: CTA CHEST FINDINGS Cardiovascular: There is no evidence of acute intramural hematoma on the initial noncontrast images. There is no evidence of acute dissection or aneurysm on the postcontrast images. There is no evidence of pulmonary embolism. The heart size is normal. There is no pericardial effusion. There is mild calcified plaque in the aortic arch and at the origins of the branch vessels. Mediastinum/Nodes: The esophagus is grossly unremarkable. There is no mediastinal, hilar, or axillary lymphadenopathy. Lungs/Pleura: The trachea and central airways are patent. The lungs are well  inflated. There is a small area of platelike atelectasis in the lingula. There is no focal consolidation or pulmonary edema. There is no pleural effusion or pneumothorax. There are no suspicious nodules. Musculoskeletal: There is no acute osseous abnormality or suspicious osseous lesion. Review of the MIP images confirms the above findings. CTA ABDOMEN AND PELVIS FINDINGS VASCULAR Aorta: Normal caliber aorta without aneurysm, dissection, vasculitis or significant stenosis. There is minimal calcified plaque. Celiac: Patent without evidence of aneurysm, dissection, vasculitis or significant stenosis. SMA: Patent without evidence of aneurysm, dissection, vasculitis or significant stenosis. Renals: Both renal arteries are patent without evidence of aneurysm, dissection, vasculitis, fibromuscular dysplasia or significant stenosis. IMA: Patent without evidence of aneurysm, dissection, vasculitis or significant stenosis. Inflow: Patent without evidence of aneurysm, dissection, vasculitis or significant stenosis. Veins: No obvious venous abnormality within the limitations of this arterial phase study. Review of the MIP images confirms the above findings. NON-VASCULAR Hepatobiliary: Liver surface contour is mildly nodular likely reflecting cirrhosis. There is an ill-defined area of hypodensity in the right hepatic lobe measuring 1.3 cm (4-93), unchanged since 2020 and likely reflecting a benign cyst or hemangioma requiring no specific imaging follow-up. There are no other definite focal lesions, within the confines of single phase technique. Pancreas: Unremarkable. Spleen: Unremarkable. Adrenals/Urinary Tract: The adrenals are unremarkable. The kidneys are unremarkable, with no focal lesion, stone, hydronephrosis, or hydroureter. Stomach/Bowel: The stomach is unremarkable. There is no evidence of bowel obstruction. There is no abnormal bowel wall thickening or inflammatory change. There is colonic diverticulosis without  evidence of acute diverticulitis. Lymphatic: There is no abdominopelvic lymphadenopathy. Reproductive: Uterus is surgically absent. There is no adnexal mass. Other: There is no ascites or free air. Musculoskeletal: Is no acute osseous abnormality or suspicious osseous lesion. Review of the MIP images confirms the above findings. IMPRESSION: 1. No evidence of aortic dissection or aneurysm, or other acute pathology in the chest, abdomen, or pelvis. 2. Cirrhotic liver. 3. Diverticulosis without evidence of acute diverticulitis. Electronically Signed   By: Lesia Hausen M.D.   On: 05/01/2022 14:32   US Abdomen Limited RUQ (LIVER/GB)  Result Date: 05/01/2022 CLINICAL DATA:  151471 RUQ pain 151471 EXAM: ULTRASOUND ABDOMEN LIMITED RIGHT UPPER QUADRANT COMPARISON:  CT 02/18/2016. FINDINGS: Gallbladder: No gallstones or wall thickening visualized. No sonographic Murphy sign noted by sonographer. Common bile duct: Diameter: 6.7 mm, normal.  No intrahepatic ductal  dilation. Liver: Increased liver echogenicity in coarse echotexture. There is a cyst in the right hepatic lobe measuring up to 1.0 cm, seen on prior CT. Portal vein is patent on color Doppler imaging with normal direction of blood flow towards the liver. Other: None. IMPRESSION: No evidence of cholecystitis or biliary obstruction. Increased liver echogenicity and coarse echotexture, suggesting chronic liver disease with a degree of steatosis. Electronically Signed   By: Caprice Renshaw M.D.   On: 05/01/2022 13:54   DG Chest 2 View  Result Date: 05/01/2022 CLINICAL DATA:  Chest pain EXAM: CHEST - 2 VIEW COMPARISON:  01/08/2022 FINDINGS: Cardiac size is within normal limits. Lung fields are clear of any infiltrates or pulmonary edema. Small linear densities in the left lower lung fields suggest minimal scarring or subsegmental atelectasis. There is no pleural effusion or pneumothorax. IMPRESSION: No active cardiopulmonary disease. Electronically Signed   By:  Ernie Avena M.D.   On: 05/01/2022 12:41    Procedures Procedures    Medications Ordered in ED Medications  aspirin chewable tablet 324 mg (has no administration in time range)  alum & mag hydroxide-simeth (MAALOX/MYLANTA) 200-200-20 MG/5ML suspension 30 mL (has no administration in time range)    ED Course/ Medical Decision Making/ A&P                           Medical Decision Making Amount and/or Complexity of Data Reviewed Labs: ordered. Decision-making details documented in ED Course. Radiology: ordered and independent interpretation performed. Decision-making details documented in ED Course. ECG/medicine tests: ordered and independent interpretation performed. Decision-making details documented in ED Course.  Risk OTC drugs. Prescription drug management.  Right chest radiating to right axilla and upper back and neck.  EKG shows no acute ischemia.  Vitals are stable, no distress but appears uncomfortable.  We will obtain chest x-ray, labs, give aspirin and GI cocktail.  Some typical as well as atypical features of her pain.  Considered ACS, pulmonary embolism, esophagitis or gastritis as well as gallbladder pathology.  LFTs and lipase are normal.  Age-adjusted D-dimer is normal. CXR is normal. Results reviewed and interpreted by me. Troponin negative.   CTA negative for pulmonary embolism or aortic dissection, Results reviewed and interpreted by me.  Ultrasound shows no evidence of gallstones but does show hepatic steatosis.  Chest pain has resolved. Feels well. Troponin negative x2.   Heart score is 4.  Discussed risks and benefits of admission to the hospital with likely need for stress test. Patient does not want to be admitted to the hospital.  She appears to have capacity to make this decision.  Discussed with her that she is low risk for heart disease but nonzero risk and should have a stress test.  Cardiology referral was placed. Continue PPI. Reduce alcohol.  Avoid NSAIDs, caffeine, spicy foods. Referral to GI as well for possible EGD.  Again declines admission to hospital. Appears to have capacity to make this decision. Followup with cardiology and GI as well as PCP. Return to the ED with recurrent pain, exertional pain, pain associated with SOB, vomiting, diaphoresis or any other concerns.         Final Clinical Impression(s) / ED Diagnoses Final diagnoses:  Atypical chest pain    Rx / DC Orders ED Discharge Orders     None         Quintez Maselli, Jeannett Senior, MD 05/01/22 1659

## 2022-05-01 NOTE — Discharge Instructions (Addendum)
No signs of heart attack or blood clot in the lung today.  As we discussed you are low risk for heart disease but not zero risk.  You should follow-up to have a stress test.  The cardiologist should call you for an appointment.  Continue taking Protonix.  Avoid caffeine, alcohol, NSAIDs, spicy foods as these can be rough on the stomach.  You are also given an appointment for the gastroenterologist for further evaluation of your abdominal pain. Return to the ED with exertional chest pain, pain associate with shortness of breath, nausea, vomiting, other concerns.

## 2022-05-01 NOTE — ED Notes (Signed)
ED Provider at bedside. 

## 2022-05-01 NOTE — ED Triage Notes (Signed)
Patient reports chest pain that started this am and it radiates everywhere.  Specifically in throat right shoulder blade.  Patient reports she is an alcohol drinks 2-3 shots x4 a day.  Reports pain started in her armpit.  Denies n/v sob

## 2022-05-28 NOTE — Progress Notes (Unsigned)
Cardiology Office Note:    Date:  05/28/2022   ID:  Megan Singleton, DOB 05-13-1951, MRN [id number]  PCP:  Megan Au, MD   [facility] Providers Cardiologist:  None {  Referring MD: Megan Au, MD    History of Present Illness:    Megan Singleton is a 71 y.o. female with a hx of alcohol abuse, depression, fibromyalgia, HLD and seizure disorder who was referred by Dr. Leavy Singleton for further evaluation of chest pain.  Patient seen in [facility] ED on 05/01/22. Note reviewed. She developed chest and back pain after fixing a mailbox with a drill earlier this morning. Pain was constant and worse with arm movement and palpation. In the ER, trop negative, ECG nonischemic. CXR without acute pathology. CTA negative for PE/dissection but notable for cirrhosis of the liver. Given reassuring work-up, she was discharged home with CV follow-up.  Today, ***  Past Medical History:  Diagnosis Date   Alcohol abuse    Allergy    Depression    Fibromyalgia    GERD (gastroesophageal reflux disease)    Hashimoto's thyroiditis    Hyperlipidemia    IBS (irritable bowel syndrome)    Osteopenia    Prolonged QT interval    RLS (restless legs syndrome)    Seizure disorder (HCC)    she is on life long medication per Neuro and can not have driving privileges if she  stops meds   Seizures (HCC)    Thyroid disease     Past Surgical History:  Procedure Laterality Date   BUNIONECTOMY     Right and Hammertoe   CESAREAN SECTION  [id number]   x's 2   ESOPHAGEAL MANOMETRY N/A 12/09/2016   Procedure: ESOPHAGEAL MANOMETRY (EM);  Surgeon: Carman Ching, MD;  Location: [facility];  Service: Endoscopy;  Laterality: N/A;   ESOPHAGOGASTRODUODENOSCOPY (EGD) WITH PROPOFOL N/A 05/23/2020   Procedure: ESOPHAGOGASTRODUODENOSCOPY (EGD) WITH PROPOFOL with Botox;  Surgeon: Kathi Der, MD;  Location: [facility];  Service: Gastroenterology;  Laterality: N/A;   FOOT SURGERY      Left--Neuroma, Bunion   LIPOSUCTION     abdominal   SUBMUCOSAL INJECTION  05/23/2020   Procedure: SUBMUCOSAL INJECTION;  Surgeon: Kathi Der, MD;  Location: [facility];  Service: Gastroenterology;;   TONSILLECTOMY     as a child   WRIST SURGERY     Right wrist---Benign mass    Current Medications: No outpatient medications have been marked as taking for the 06/02/22 encounter (Appointment) with Megan Sprague, MD.     Allergies:   Morphine   Social History   Socioeconomic History   Marital status: Married    Spouse name: Not on file   Number of children: Not on file   Years of education: Not on file   Highest education level: Not on file  Occupational History   Not on file  Tobacco Use   Smoking status: Former    Types: Cigarettes    Quit date: 07/21/1979    Years since quitting: 42.8   Smokeless tobacco: Never  Vaping Use   Vaping Use: Never used  Substance and Sexual Activity   Alcohol use: Yes    Alcohol/week: 21.0 standard drinks of alcohol    Types: 21 Shots of liquor per week   Drug use: No   Sexual activity: Not on file  Other Topics Concern   Not on file  Social History Narrative   Not on file   Social Determinants of Health  Financial Resource Strain: Not on file  Food Insecurity: Not on file  Transportation Needs: Not on file  Physical Activity: Not on file  Stress: Not on file  Social Connections: Not on file     Family History: The patient's ***family history includes Breast cancer in her mother; Cancer in her cousin; Depression in her father; Diabetes in an other family member; Heart disease in an other family member; Hyperlipidemia in her father and mother; Kidney disease in an other family member; Prostate cancer in her father; Stroke in an other family member.  ROS:   Please see the history of present illness.    *** All other systems reviewed and are negative.  EKGs/Labs/Other Studies Reviewed:    The following studies  were reviewed today: CTA 04/2022: FINDINGS: CTA CHEST FINDINGS   Cardiovascular: There is no evidence of acute intramural hematoma on the initial noncontrast images. There is no evidence of acute dissection or aneurysm on the postcontrast images. There is no evidence of pulmonary embolism. The heart size is normal. There is no pericardial effusion. There is mild calcified plaque in the aortic arch and at the origins of the branch vessels.   Mediastinum/Nodes: The esophagus is grossly unremarkable. There is no mediastinal, hilar, or axillary lymphadenopathy.   Lungs/Pleura: The trachea and central airways are patent.   The lungs are well inflated. There is a small area of platelike atelectasis in the lingula. There is no focal consolidation or pulmonary edema. There is no pleural effusion or pneumothorax.   There are no suspicious nodules.   Musculoskeletal: There is no acute osseous abnormality or suspicious osseous lesion.   Review of the MIP images confirms the above findings.   CTA ABDOMEN AND PELVIS FINDINGS   VASCULAR   Aorta: Normal caliber aorta without aneurysm, dissection, vasculitis or significant stenosis. There is minimal calcified plaque.   Celiac: Patent without evidence of aneurysm, dissection, vasculitis or significant stenosis.   SMA: Patent without evidence of aneurysm, dissection, vasculitis or significant stenosis.   Renals: Both renal arteries are patent without evidence of aneurysm, dissection, vasculitis, fibromuscular dysplasia or significant stenosis.   IMA: Patent without evidence of aneurysm, dissection, vasculitis or significant stenosis.   Inflow: Patent without evidence of aneurysm, dissection, vasculitis or significant stenosis.   Veins: No obvious venous abnormality within the limitations of this arterial phase study.   Review of the MIP images confirms the above findings.   NON-VASCULAR   Hepatobiliary: Liver surface contour  is mildly nodular likely reflecting cirrhosis. There is an ill-defined area of hypodensity in the right hepatic lobe measuring 1.3 cm (4-93), unchanged since 2020 and likely reflecting a benign cyst or hemangioma requiring no specific imaging follow-up. There are no other definite focal lesions, within the confines of single phase technique.   Pancreas: Unremarkable.   Spleen: Unremarkable.   Adrenals/Urinary Tract: The adrenals are unremarkable.   The kidneys are unremarkable, with no focal lesion, stone, hydronephrosis, or hydroureter.   Stomach/Bowel: The stomach is unremarkable. There is no evidence of bowel obstruction. There is no abnormal bowel wall thickening or inflammatory change. There is colonic diverticulosis without evidence of acute diverticulitis.   Lymphatic: There is no abdominopelvic lymphadenopathy.   Reproductive: Uterus is surgically absent. There is no adnexal mass.   Other: There is no ascites or free air.   Musculoskeletal: Is no acute osseous abnormality or suspicious osseous lesion.   Review of the MIP images confirms the above findings.   IMPRESSION: 1. No evidence  of aortic dissection or aneurysm, or other acute pathology in the chest, abdomen, or pelvis. 2. Cirrhotic liver. 3. Diverticulosis without evidence of acute diverticulitis.     Electronically Signed   By: Lesia Hausen M.D  EKG:  EKG is *** ordered today.  The ekg ordered today demonstrates ***  Recent Labs: 05/01/2022: ALT 39; BUN 16; Creatinine, Ser 0.61; Hemoglobin 13.8; Platelets 174; Potassium 3.4; Sodium 140  Recent Lipid Panel    Component Value Date/Time   CHOL 172 11/19/2014 0952   TRIG 100.0 11/19/2014 0952   TRIG 50 07/01/2006 0820   HDL 71.60 11/19/2014 0952   CHOLHDL 2 11/19/2014 0952   VLDL 20.0 11/19/2014 0952   LDLCALC 80 11/19/2014 0952     Risk Assessment/Calculations:   {Does this patient have ATRIAL FIBRILLATION?:[id number]}  No BP recorded.   {Refresh Note OR Click here to enter BP  :1}***         Physical Exam:    VS:  There were no vitals taken for this visit.    Wt Readings from Last 3 Encounters:  05/01/22 145 lb (65.8 kg)  03/28/21 145 lb (65.8 kg)  03/22/21 137 lb (62.1 kg)     GEN: *** Well nourished, well developed in no acute distress HEENT: Normal NECK: No JVD; No carotid bruits LYMPHATICS: No lymphadenopathy CARDIAC: ***RRR, no murmurs, rubs, gallops RESPIRATORY:  Clear to auscultation without rales, wheezing or rhonchi  ABDOMEN: Soft, non-tender, non-distended MUSCULOSKELETAL:  No edema; No deformity  SKIN: Warm and dry NEUROLOGIC:  Alert and oriented x 3 PSYCHIATRIC:  Normal affect   ASSESSMENT:    No diagnosis found. PLAN:    In order of problems listed above:  #Chest Pain: Likely MSK in nature given that it developed after drilling in a mailbox and worsened with palpation and arm movement. CTA with mild calcification but no acute pathology. Denies any exertional symptoms. Will risk stratify with Ca score.  #HTN:  #HLD: -Continue simvastatin  #Cirrhosis with history of alcohol abuse: Noted on CTA chest/abd/pelvis.  -Refer to GI      {Are you ordering a CV Procedure (e.g. stress test, cath, DCCV, TEE, etc)?   Press F2        :[id number]}    Medication Adjustments/Labs and Tests Ordered: Current medicines are reviewed at length with the patient today.  Concerns regarding medicines are outlined above.  No orders of the defined types were placed in this encounter.  No orders of the defined types were placed in this encounter.   There are no Patient Instructions on file for this visit.   Signed, Megan Sprague, MD  05/28/2022 11:47 AM    [facility]

## 2022-06-02 ENCOUNTER — Encounter: Payer: Self-pay | Admitting: Cardiology

## 2022-06-02 ENCOUNTER — Ambulatory Visit: Payer: Medicare Other | Attending: Cardiology | Admitting: Cardiology

## 2022-06-02 VITALS — BP 130/76 | HR 95 | Ht 64.0 in | Wt 155.0 lb

## 2022-06-02 DIAGNOSIS — R011 Cardiac murmur, unspecified: Secondary | ICD-10-CM | POA: Diagnosis not present

## 2022-06-02 DIAGNOSIS — I10 Essential (primary) hypertension: Secondary | ICD-10-CM

## 2022-06-02 DIAGNOSIS — E785 Hyperlipidemia, unspecified: Secondary | ICD-10-CM | POA: Diagnosis not present

## 2022-06-02 DIAGNOSIS — R079 Chest pain, unspecified: Secondary | ICD-10-CM | POA: Diagnosis not present

## 2022-06-02 MED ORDER — DILTIAZEM HCL ER COATED BEADS 120 MG PO CP24: 120.0000 mg | ORAL_CAPSULE | Freq: Every day | ORAL | 3 refills | Status: DC

## 2022-06-02 NOTE — Patient Instructions (Signed)
Medication Instructions:   STOP TAKING SHORT ACTING DILTIAZEM 30 MG NOW  START TAKING LONG ACTING DILTIAZEM (CARDIZEM CD) 120 MG BY MOUTH DAILY  *If you need a refill on your cardiac medications before your next appointment, please call your pharmacy*   Testing/Procedures:  Your physician has requested that you have an echocardiogram. Echocardiography is a painless test that uses sound waves to create images of your heart. It provides your doctor with information about the size and shape of your heart and how well your heart's chambers and valves are working. This procedure takes approximately one hour. There are no restrictions for this procedure. Please do NOT wear cologne, perfume, aftershave, or lotions (deodorant is allowed). Please arrive 15 minutes prior to your appointment time.   CARDIAC CALCIUM SCORE (SELF PAY)   Follow-Up: At [facility], you and your health needs are our priority.  As part of our continuing mission to provide you with exceptional heart care, we have created designated Provider Care Teams.  These Care Teams include your primary Cardiologist (physician) and Advanced Practice Providers (APPs -  Physician Assistants and Nurse Practitioners) who all work together to provide you with the care you need, when you need it.  We recommend signing up for the patient portal called "MyChart".  Sign up information is provided on this After Visit Summary.  MyChart is used to connect with patients for Virtual Visits (Telemedicine).  Patients are able to view lab/test results, encounter notes, upcoming appointments, etc.  Non-urgent messages can be sent to your provider as well.   To learn more about what you can do with MyChart, go to [email or web address].    Your next appointment:   1 year(s)  The format for your next appointment:   In Person  Provider:   DR. Shari Prows   Important Information About Sugar

## 2022-06-16 ENCOUNTER — Ambulatory Visit: Payer: Medicare Other

## 2022-06-17 ENCOUNTER — Ambulatory Visit (HOSPITAL_COMMUNITY)
Admission: RE | Admit: 2022-06-17 | Discharge: 2022-06-17 | Disposition: A | Discharge status: Home / Self Care | Payer: Medicare Other | Source: Ambulatory Visit | Attending: Cardiology | Admitting: Cardiology

## 2022-06-17 DIAGNOSIS — E785 Hyperlipidemia, unspecified: Secondary | ICD-10-CM | POA: Insufficient documentation

## 2022-06-19 ENCOUNTER — Telehealth: Payer: Self-pay | Admitting: *Deleted

## 2022-06-19 DIAGNOSIS — E785 Hyperlipidemia, unspecified: Secondary | ICD-10-CM

## 2022-06-19 DIAGNOSIS — I2510 Atherosclerotic heart disease of native coronary artery without angina pectoris: Secondary | ICD-10-CM

## 2022-06-19 DIAGNOSIS — Z79899 Other long term (current) drug therapy: Secondary | ICD-10-CM

## 2022-06-19 MED ORDER — ROSUVASTATIN CALCIUM 10 MG PO TABS: 10.0000 mg | ORAL_TABLET | Freq: Every day | ORAL | 2 refills | Status: DC

## 2022-06-19 NOTE — Telephone Encounter (Signed)
The patient has been notified of the result and verbalized understanding.  All questions (if any) were answered.  Pt aware to stop taking simvastatin if she is still taking that.  She is aware to start taking crestor 10 mg po daily and come in for repeat lipids in 8 weeks.   Confirmed the pharmacy of choice with the pt. Pt will come in for repeat lipids in 8 weeks on 08/14/22.  She is aware to come fasting to this lab appt.   Pt verbalized understanding and agrees with this plan.

## 2022-06-19 NOTE — Telephone Encounter (Signed)
-----   Message from Loa Socks, LPN sent at 33/02/3290  7:40 AM EST -----  ----- Message ----- From: Meriam Sprague, MD Sent: 06/17/2022   8:41 PM EST To: Cv Div [facility] St Triage  Her calcium score is 25 (53%). This means she has a little plaque in her heart arteries.Is she willing to trial a new statin to prevent the plaque from getting worse. We could trial crestor 10mg  daily if she is amenable with repeat lipids in 8 weeks.

## 2022-06-22 IMAGING — DX DG ABDOMEN ACUTE W/ 1V CHEST
4 series · 4 of 4 positions shown · non-contrast
Comparison: CT 02/21/2019 chest radiograph 03/20/2021

CLINICAL DATA: Choking today history of esophageal stricture

EXAM:
DG ABDOMEN ACUTE WITH 1 VIEW CHEST

[abdomen erect]
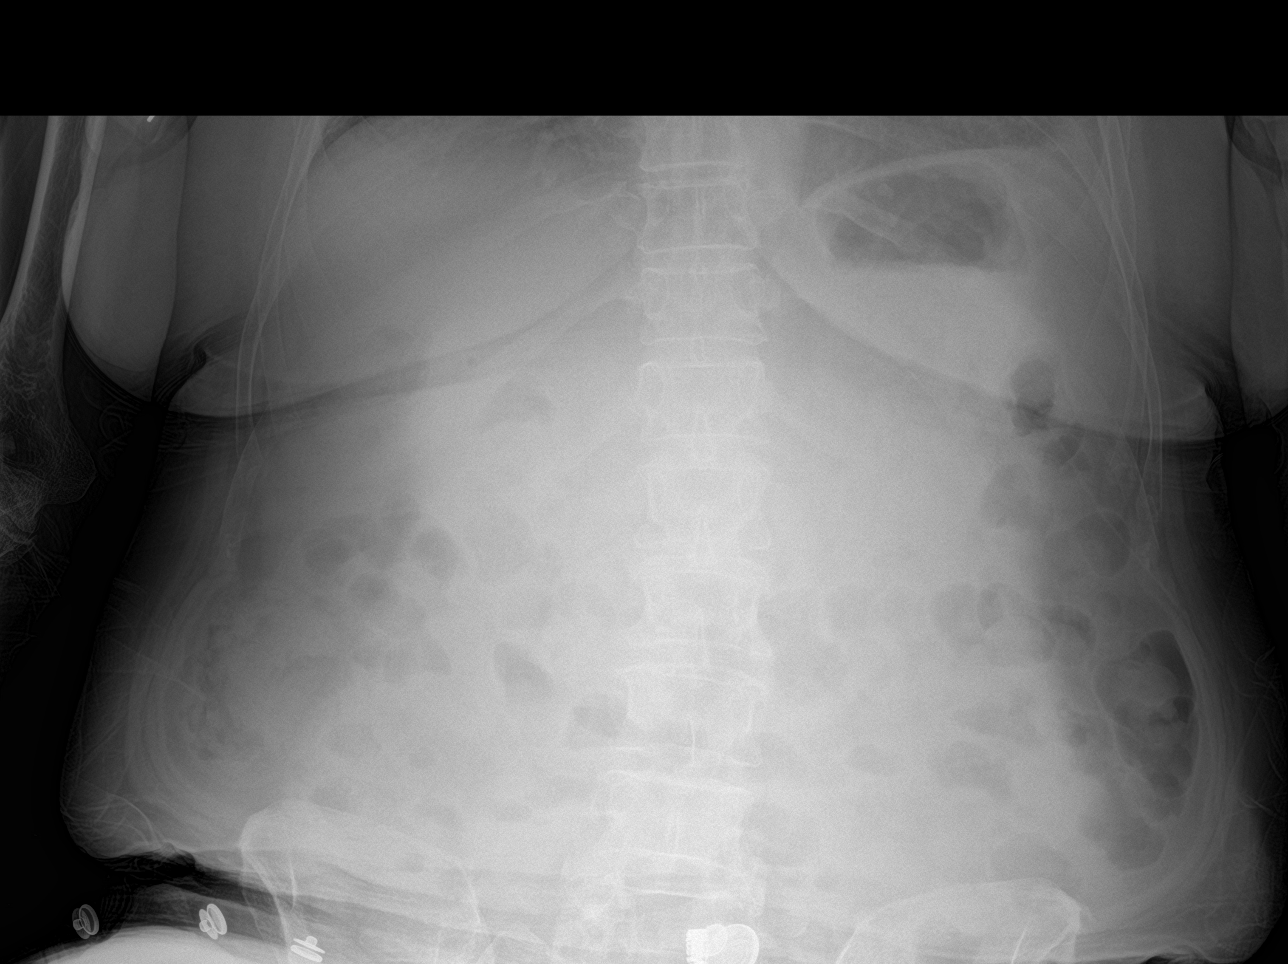

[abdomen supine (1 of 2)]
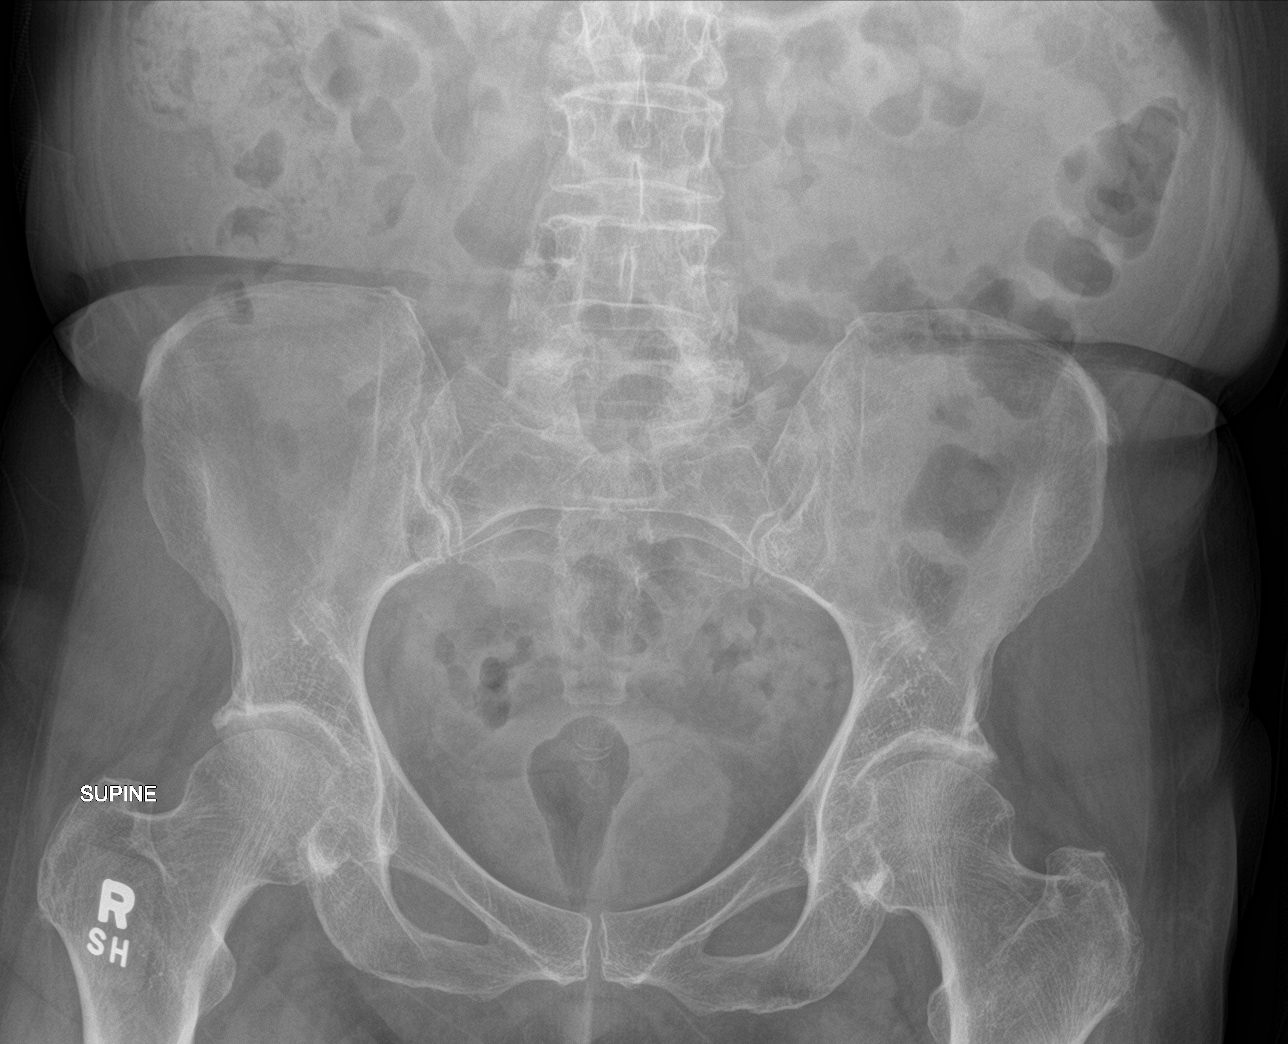

[abdomen supine (2 of 2)]
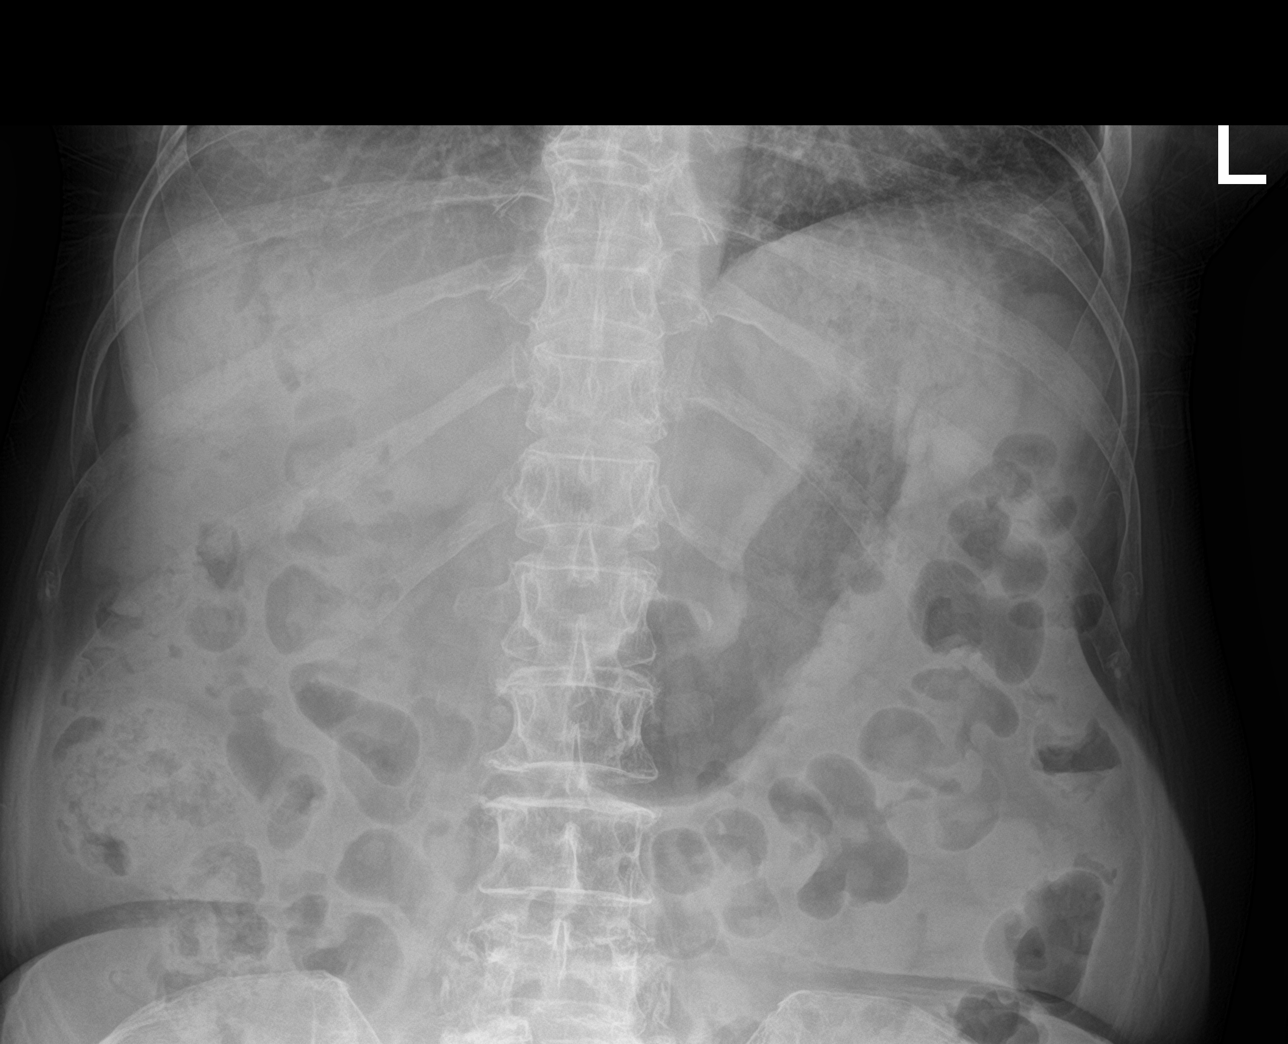

[chest ap]
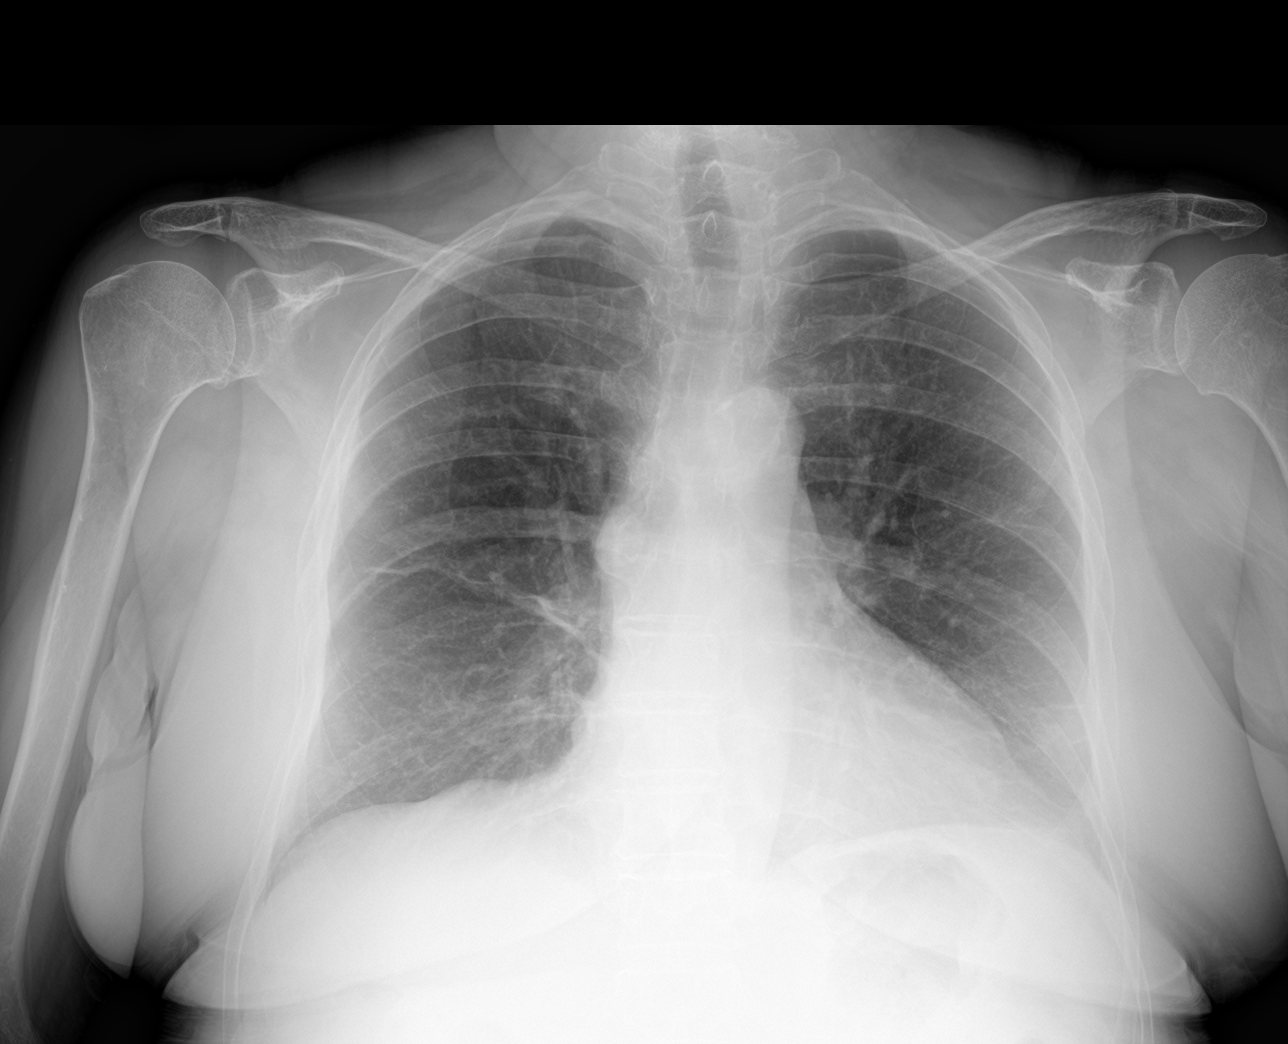

[4 of 4 positions shown; findings below may reference images not displayed]

FINDINGS: Nonobstructive bowel gas pattern. No free intraperitoneal gas.
Unchanged cardiomediastinal silhouette. There is no focal airspace
disease. There is unchanged bilateral scarring/linear atelectasis.
No large pleural effusion or visible pneumothorax. Bilateral
shoulder degenerative changes. No acute osseous abnormality.
IMPRESSION: Nonobstructive bowel gas pattern. No evidence of acute
cardiopulmonary disease.

## 2022-06-24 ENCOUNTER — Other Ambulatory Visit (HOSPITAL_COMMUNITY): Payer: Medicare Other

## 2022-06-24 ENCOUNTER — Ambulatory Visit (HOSPITAL_COMMUNITY): Payer: Medicare Other | Attending: Cardiology

## 2022-06-24 DIAGNOSIS — R079 Chest pain, unspecified: Secondary | ICD-10-CM | POA: Diagnosis present

## 2022-06-24 LAB — ECHOCARDIOGRAM COMPLETE
Area-P 1/2: 3.59 cm2
S' Lateral: 2.6 cm

## 2022-06-28 IMAGING — CR DG CHEST 2V
2 series · 2 of 2 positions shown · non-contrast
Comparison: 03/20/2021

CLINICAL DATA: Shortness of breath

EXAM:
CHEST - 2 VIEW

[chest lat]
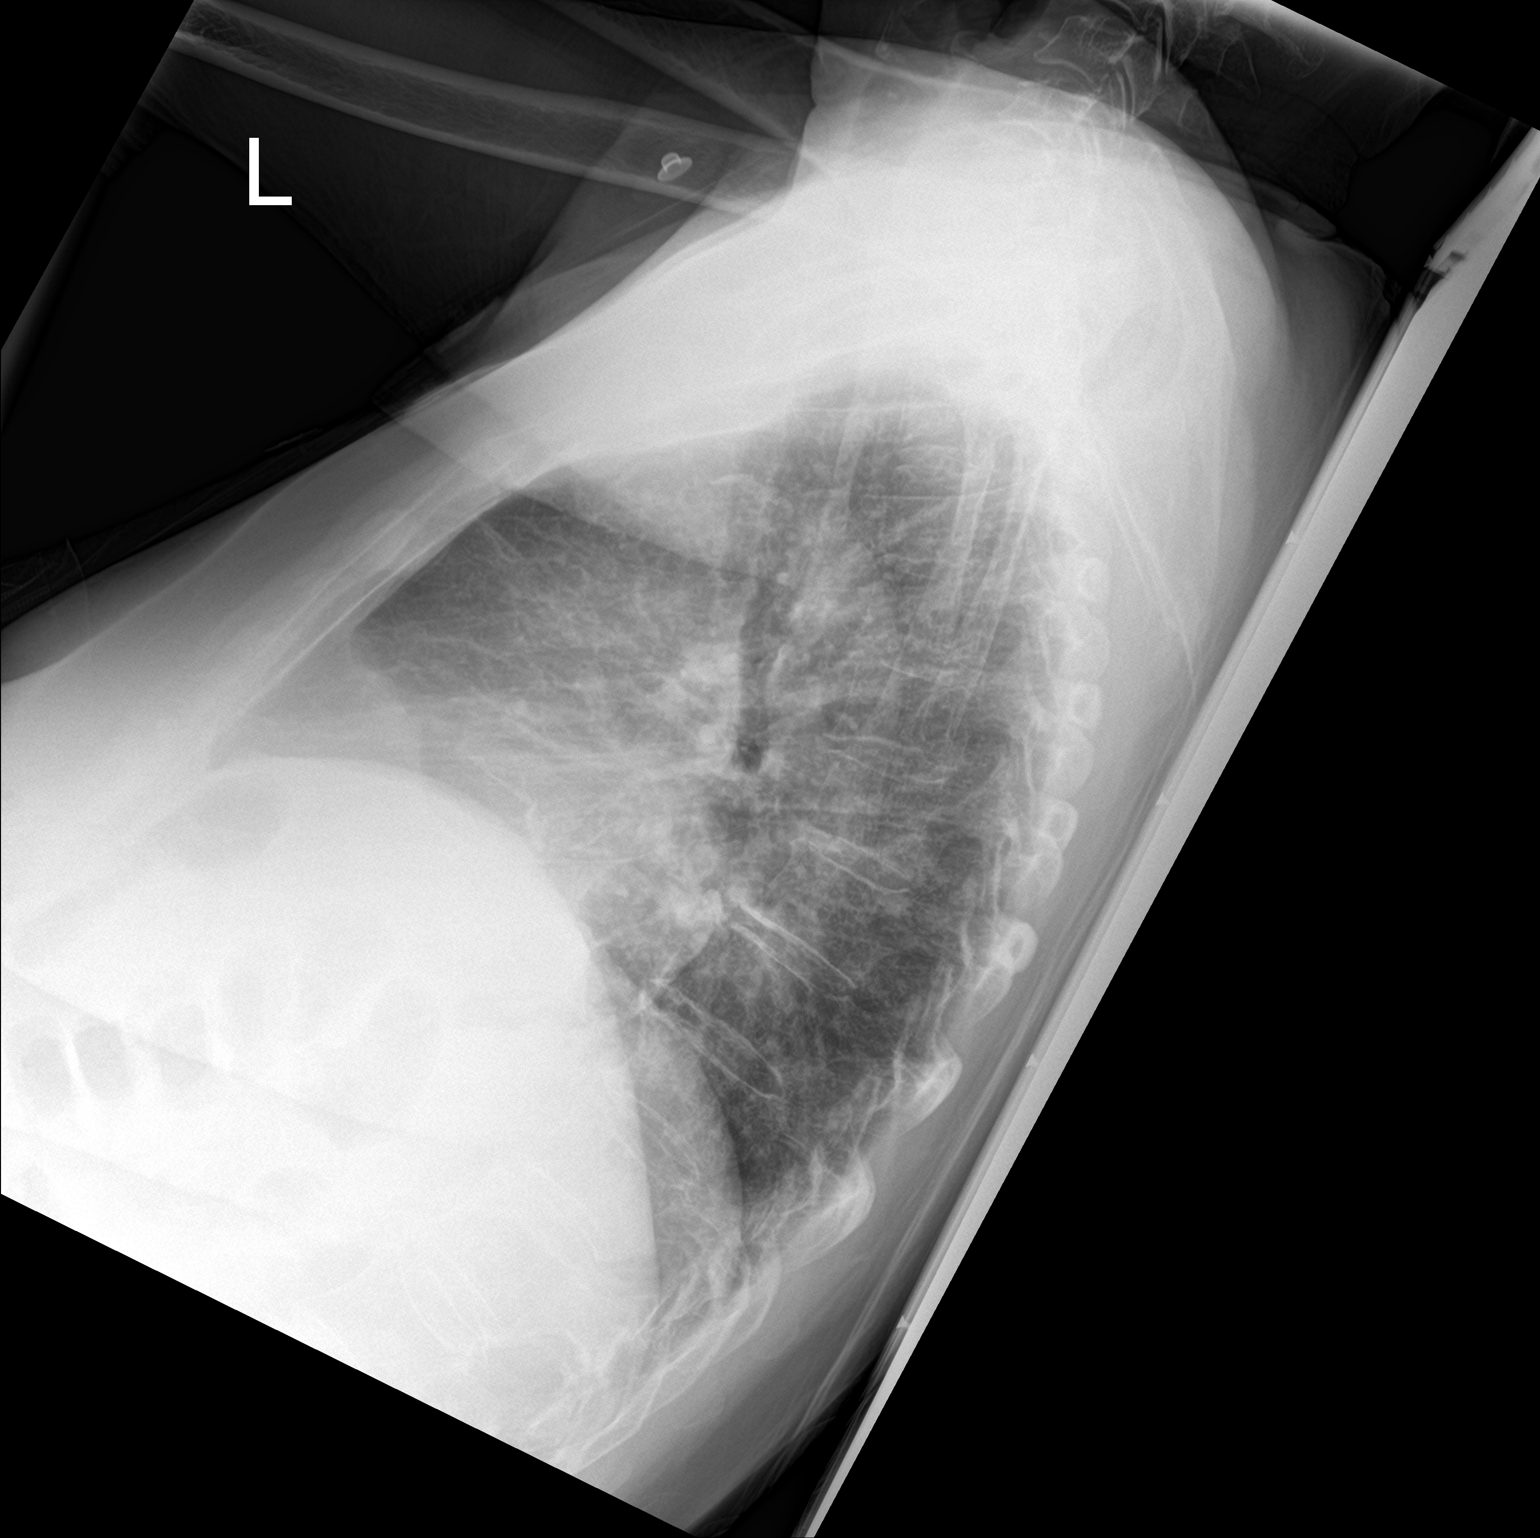

[chest ap]
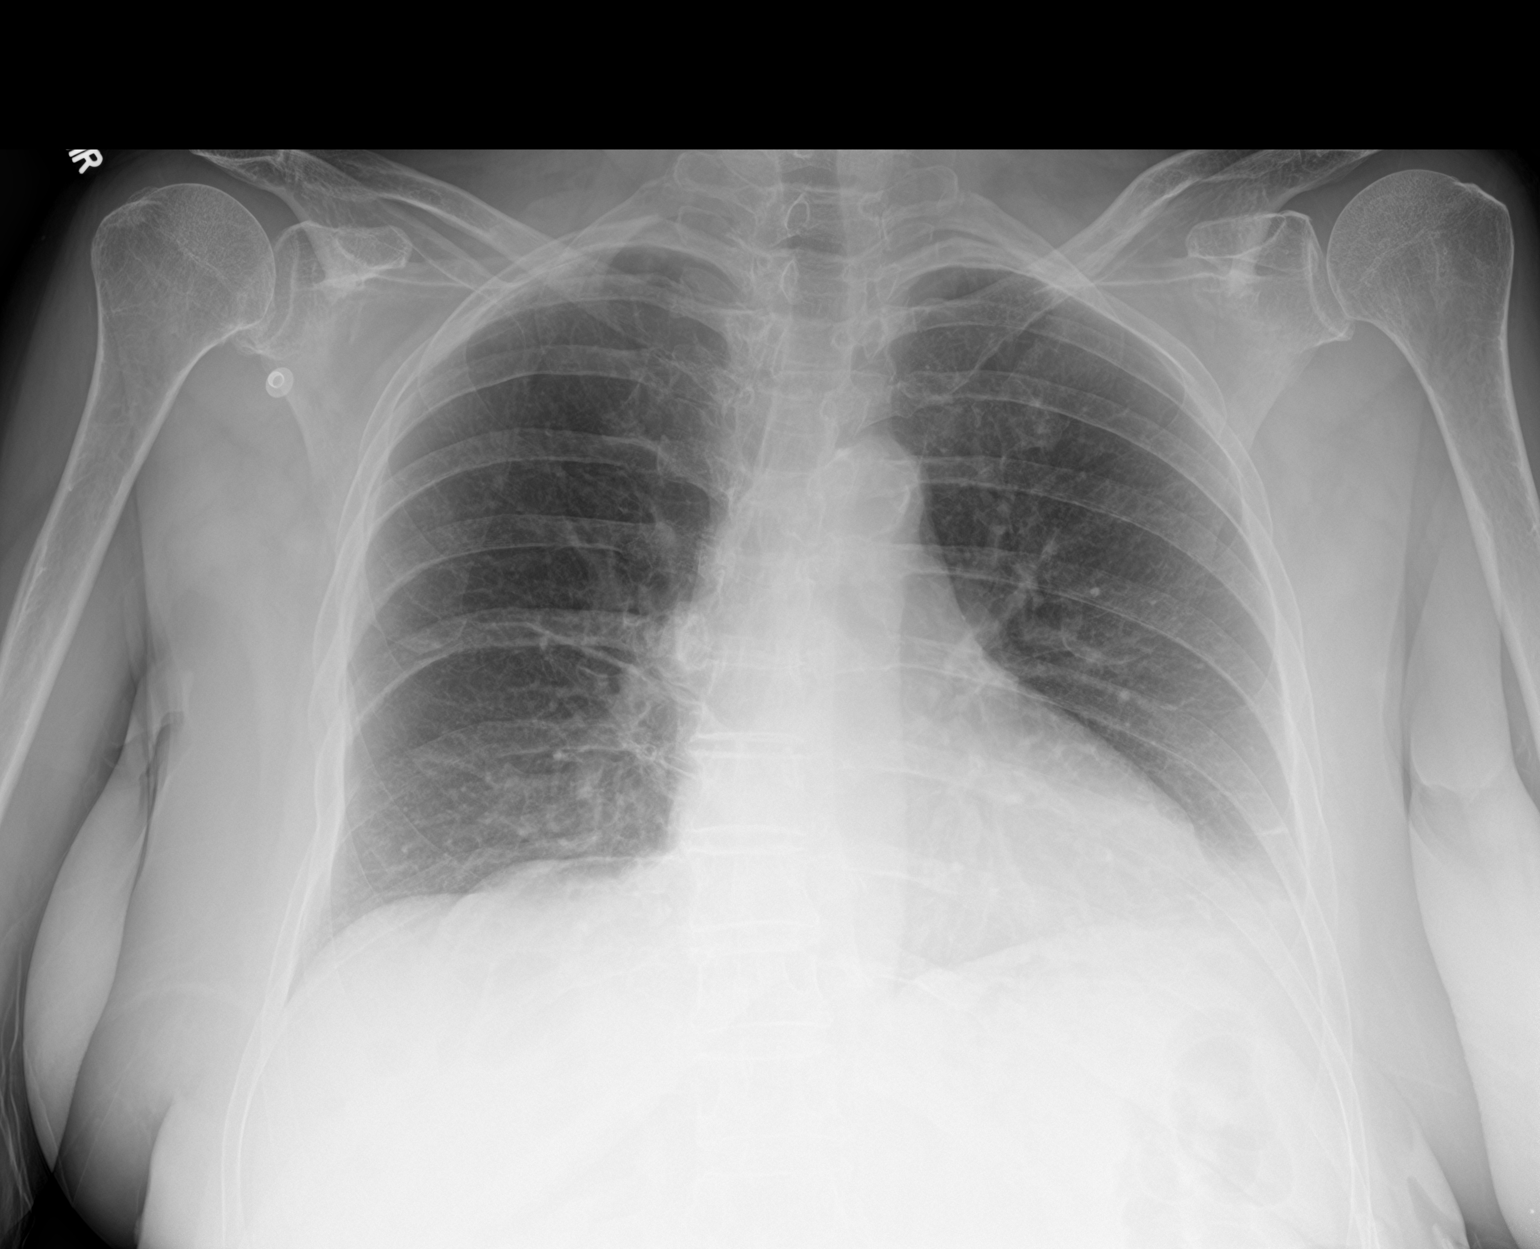

[2 of 2 positions shown; findings below may reference images not displayed]

FINDINGS: No focal opacity or pleural effusion. Normal cardiomediastinal
silhouette with aortic atherosclerosis. No pneumothorax.
IMPRESSION: No active cardiopulmonary disease.

## 2022-07-16 ENCOUNTER — Ambulatory Visit (HOSPITAL_COMMUNITY): Payer: Medicare Other

## 2022-07-31 ENCOUNTER — Encounter (HOSPITAL_BASED_OUTPATIENT_CLINIC_OR_DEPARTMENT_OTHER): Payer: Self-pay

## 2022-07-31 ENCOUNTER — Emergency Department (HOSPITAL_BASED_OUTPATIENT_CLINIC_OR_DEPARTMENT_OTHER): Payer: Medicare Other

## 2022-07-31 ENCOUNTER — Emergency Department (HOSPITAL_BASED_OUTPATIENT_CLINIC_OR_DEPARTMENT_OTHER)
Admission: EM | Admit: 2022-07-31 | Discharge: 2022-07-31 | Disposition: A | Discharge status: Home / Self Care | Payer: Medicare Other | Attending: Emergency Medicine | Admitting: Emergency Medicine

## 2022-07-31 DIAGNOSIS — M5441 Lumbago with sciatica, right side: Secondary | ICD-10-CM | POA: Diagnosis not present

## 2022-07-31 DIAGNOSIS — M79604 Pain in right leg: Secondary | ICD-10-CM | POA: Diagnosis present

## 2022-07-31 DIAGNOSIS — M5431 Sciatica, right side: Secondary | ICD-10-CM

## 2022-07-31 MED ORDER — OXYCODONE-ACETAMINOPHEN 5-325 MG PO TABS: 1.0000 | ORAL_TABLET | Freq: Four times a day (QID) | ORAL | 0 refills | Status: DC | PRN

## 2022-07-31 MED ORDER — DEXAMETHASONE SODIUM PHOSPHATE 10 MG/ML IJ SOLN
10.0000 mg | Freq: Once | INTRAMUSCULAR | Status: AC
  Administered 2022-07-31: 10 mg via INTRAVENOUS
  Filled 2022-07-31: qty 1

## 2022-07-31 MED ORDER — HYDROMORPHONE HCL 1 MG/ML IJ SOLN
1.0000 mg | Freq: Once | INTRAMUSCULAR | Status: AC
  Administered 2022-07-31: 1 mg via INTRAVENOUS
  Filled 2022-07-31: qty 1

## 2022-07-31 MED ORDER — METHYLPREDNISOLONE 4 MG PO TBPK: ORAL_TABLET | ORAL | 0 refills | Status: DC

## 2022-07-31 MED ORDER — KETOROLAC TROMETHAMINE 30 MG/ML IJ SOLN
30.0000 mg | Freq: Once | INTRAMUSCULAR | Status: AC
  Administered 2022-07-31: 30 mg via INTRAVENOUS
  Filled 2022-07-31: qty 1

## 2022-07-31 NOTE — Discharge Instructions (Addendum)
1.  You were given a steroid in the emergency department.  You had a shot called Decadron.  This will be working in the next 12 to 24 hours.  You will start a steroid taper pack tomorrow called Medrol Dosepak.  You do not need to take any additional ibuprofen\naproxen or Aleve with this steroid therapy. 2.  You have been prescribed Percocet to take for severe pain.  You may take 1 to 2 tablets every 6 hours.  When your pain is improving, you may transition to extra strength Tylenol.  You may also try 1 extra strength Tylenol tablet with 1 Percocet tablet to see if this is adequate for pain control.  Every Percocet tablet contains the equivalent of a regular strength 325 mg tablet of Tylenol. 3.  Percocet can cause constipation.  If you tend to get constipated, take an over-the-counter stool softener such as Colace.  If you are not having a normal bowel movement every several days, start taking MiraLAX daily. 4.  Return to the emergency department if you are getting weakness in your leg, difficulty with bowel or bladder function or other concerning changes.
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
## 2022-07-31 NOTE — ED Triage Notes (Signed)
Pt ambulatory with cane and noted limp to room. Reports posterior right leg pain that starts at buttocks and radiates down. Pain started after working out on wednesday. Pain better with sitting

## 2022-07-31 NOTE — ED Notes (Signed)
ED Provider at bedside. 
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
## 2022-08-14 ENCOUNTER — Ambulatory Visit: Payer: Medicare Other | Attending: Cardiology

## 2022-08-14 DIAGNOSIS — Z79899 Other long term (current) drug therapy: Secondary | ICD-10-CM

## 2022-08-14 DIAGNOSIS — E785 Hyperlipidemia, unspecified: Secondary | ICD-10-CM

## 2022-08-14 DIAGNOSIS — I2510 Atherosclerotic heart disease of native coronary artery without angina pectoris: Secondary | ICD-10-CM

## 2022-08-14 LAB — LIPID PANEL
Chol/HDL Ratio: 2.4 ratio (ref 0.0–4.4)
Cholesterol, Total: 177 mg/dL (ref 100–199)
HDL: 73 mg/dL (ref >39)
LDL Chol Calc (NIH): 77 mg/dL (ref 0–99)
Triglycerides: 161 mg/dL — ABNORMAL HIGH (ref 0–149)
VLDL Cholesterol Cal: 27 mg/dL (ref 5–40)

## 2022-08-17 ENCOUNTER — Telehealth: Payer: Self-pay | Admitting: *Deleted

## 2022-08-17 DIAGNOSIS — Z79899 Other long term (current) drug therapy: Secondary | ICD-10-CM

## 2022-08-17 DIAGNOSIS — I2510 Atherosclerotic heart disease of native coronary artery without angina pectoris: Secondary | ICD-10-CM

## 2022-08-17 DIAGNOSIS — E785 Hyperlipidemia, unspecified: Secondary | ICD-10-CM

## 2022-08-17 NOTE — Telephone Encounter (Signed)
-----  Message from Freada Bergeron, MD sent at 08/17/2022  1:18 PM EST ----- Her LDL is 77. Ideally, we would have her LDL<70 given her coronary Ca score. She can either continue taking the crestor 10mg  daily and work on lifestyle modifications or we can bump the crestor to 20mg  daily (or add zetia 10mg  daily) and see how she does. Will repeat lipids in 3 months for monitoring.

## 2022-08-17 NOTE — Telephone Encounter (Signed)
The patient has been notified of the result and verbalized understanding.  All questions (if any) were answered.  Pt states she would like to continue her current dose of crestor and with healthy lifestyle modification and return for repeat lipids in 3 months, and go from there.  Pt education provided about this.   Scheduled the pt for repeat lipids in 3 months on 11/18/22.  She is aware to come fasting to this lab appt.   Pt verbalized understanding and agrees with this plan.

## 2022-08-22 ENCOUNTER — Encounter (HOSPITAL_BASED_OUTPATIENT_CLINIC_OR_DEPARTMENT_OTHER): Payer: Self-pay | Admitting: Emergency Medicine

## 2022-08-22 ENCOUNTER — Emergency Department (HOSPITAL_BASED_OUTPATIENT_CLINIC_OR_DEPARTMENT_OTHER): Payer: Medicare Other

## 2022-08-22 ENCOUNTER — Emergency Department (HOSPITAL_BASED_OUTPATIENT_CLINIC_OR_DEPARTMENT_OTHER)
Admission: EM | Admit: 2022-08-22 | Discharge: 2022-08-22 | Disposition: A | Discharge status: Home / Self Care | Payer: Medicare Other | Attending: Emergency Medicine | Admitting: Emergency Medicine

## 2022-08-22 ENCOUNTER — Other Ambulatory Visit: Payer: Self-pay

## 2022-08-22 DIAGNOSIS — R079 Chest pain, unspecified: Secondary | ICD-10-CM

## 2022-08-22 DIAGNOSIS — R0789 Other chest pain: Secondary | ICD-10-CM | POA: Insufficient documentation

## 2022-08-22 DIAGNOSIS — J069 Acute upper respiratory infection, unspecified: Secondary | ICD-10-CM | POA: Insufficient documentation

## 2022-08-22 DIAGNOSIS — R1010 Upper abdominal pain, unspecified: Secondary | ICD-10-CM | POA: Diagnosis not present

## 2022-08-22 DIAGNOSIS — R059 Cough, unspecified: Secondary | ICD-10-CM | POA: Diagnosis present

## 2022-08-22 LAB — CBC WITH DIFFERENTIAL/PLATELET
Abs Immature Granulocytes: 0.02 10*3/uL (ref 0.00–0.07)
Basophils Absolute: 0 10*3/uL (ref 0.0–0.1)
Basophils Relative: 1 %
Eosinophils Absolute: 0.1 10*3/uL (ref 0.0–0.5)
Eosinophils Relative: 2 %
HCT: 39.8 % (ref 36.0–46.0)
Hemoglobin: 13.8 g/dL (ref 12.0–15.0)
Immature Granulocytes: 0 %
Lymphocytes Relative: 32 %
Lymphs Abs: 2.3 10*3/uL (ref 0.7–4.0)
MCH: 30.8 pg (ref 26.0–34.0)
MCHC: 34.7 g/dL (ref 30.0–36.0)
MCV: 88.8 fL (ref 80.0–100.0)
Monocytes Absolute: 0.5 10*3/uL (ref 0.1–1.0)
Monocytes Relative: 6 %
Neutro Abs: 4.2 10*3/uL (ref 1.7–7.7)
Neutrophils Relative %: 59 %
Platelets: 222 10*3/uL (ref 150–400)
RBC: 4.48 MIL/uL (ref 3.87–5.11)
RDW: 12.8 % (ref 11.5–15.5)
WBC: 7.1 10*3/uL (ref 4.0–10.5)
nRBC: 0 % (ref 0.0–0.2)

## 2022-08-22 LAB — COMPREHENSIVE METABOLIC PANEL
ALT: 18 U/L (ref 0–44)
AST: 21 U/L (ref 15–41)
Albumin: 3.9 g/dL (ref 3.5–5.0)
Alkaline Phosphatase: 129 U/L — ABNORMAL HIGH (ref 38–126)
Anion gap: 8 (ref 5–15)
BUN: 10 mg/dL (ref 8–23)
CO2: 25 mmol/L (ref 22–32)
Calcium: 8.6 mg/dL — ABNORMAL LOW (ref 8.9–10.3)
Chloride: 104 mmol/L (ref 98–111)
Creatinine, Ser: 0.64 mg/dL (ref 0.44–1.00)
GFR, Estimated: >60 mL/min (ref >60)
Glucose, Bld: 103 mg/dL — ABNORMAL HIGH (ref 70–99)
Potassium: 3.6 mmol/L (ref 3.5–5.1)
Sodium: 137 mmol/L (ref 135–145)
Total Bilirubin: 1.1 mg/dL (ref 0.3–1.2)
Total Protein: 6.6 g/dL (ref 6.5–8.1)

## 2022-08-22 LAB — LIPASE, BLOOD: Lipase: 88 U/L — ABNORMAL HIGH (ref 11–51)

## 2022-08-22 LAB — TROPONIN I (HIGH SENSITIVITY): Troponin I (High Sensitivity): 3 ng/L (ref <18)

## 2022-08-22 MED ORDER — ALUM & MAG HYDROXIDE-SIMETH 200-200-20 MG/5ML PO SUSP
30.0000 mL | Freq: Once | ORAL | Status: AC
  Administered 2022-08-22: 30 mL via ORAL
  Filled 2022-08-22: qty 30

## 2022-08-22 NOTE — ED Notes (Signed)
ED Provider at bedside. 

## 2022-08-22 NOTE — ED Triage Notes (Signed)
Pt reports chest pressure and shortness of breath since last night. No lightheadedness, or dizziness. Has had a dry cough and slight sore throat since last night. Was seen recently for sciatica.

## 2022-08-22 NOTE — Discharge Instructions (Signed)
Take tylenol 2 pills 4 times a day and motrin 4 pills 3 times a day.  Drink plenty of fluids.  Return for worsening shortness of breath, headache, confusion. Follow up with your family doctor.   

## 2022-08-22 NOTE — ED Provider Notes (Signed)
[place] [facility] [place] Provider Note   CSN: [id number] Arrival date & time: 08/22/22  0809     History  Chief Complaint  Patient presents with   Chest Pain    Megan Singleton is a 72 y.o. female.  72 yo F with a chief complaint of right upper chest discomfort.  This feels like a pressure.  Has had some shortness of breath with it as well.  She unfortunately been suffering from an upper respiratory illness cough congestion going on for about a half a week now.  She had come to the emergency department recently for back pain thought to be associated with this.  Has taken all of her narcotic pain medicine and felt like the symptoms got a little bit worse.  She cannot think of anything that makes it better or worse.  Denies it worse up and moving around.  Denies trauma.  Patient denies history of MI, denies hypertension diabetes or smoking.  Denies family history of MI. Hx of HLD  Patient denies history of PE or DVT denies hemoptysis denies unilateral lower extremity edema denies recent surgery immobilization hospitalization estrogen use or history of cancer.     Chest Pain      Home Medications Prior to Admission medications   Medication Sig Start Date End Date Taking? Authorizing Provider  benzonatate (TESSALON) 100 MG capsule Take 1 capsule (100 mg total) by mouth every 8 (eight) hours. Patient not taking: Reported on 06/02/2022 03/22/21   Truddie Hidden, MD  Cyanocobalamin (B-12 PO) Take by mouth.    [provider]  diltiazem (CARDIZEM CD) 120 MG 24 hr capsule Take 1 capsule (120 mg total) by mouth daily. 06/02/22   Freada Bergeron, MD  gabapentin (NEURONTIN) 600 MG tablet Take 1,200 mg by mouth at bedtime.    [provider]  hydrOXYzine (ATARAX/VISTARIL) 25 MG tablet Take 1 tablet (25 mg total) by mouth every 8 (eight) hours as needed for anxiety. 03/22/21   Truddie Hidden, MD  levETIRAcetam (KEPPRA) 100 MG/ML  solution Take 10 mLs by mouth in the morning and at bedtime. 05/10/20   [provider]  levothyroxine (SYNTHROID) 125 MCG tablet Take 125 mcg by mouth daily before breakfast.    [provider]  methylPREDNISolone (MEDROL DOSEPAK) 4 MG TBPK tablet Per dose pack instructions starting 08/01/2022 07/31/22   Charlesetta Shanks, MD  oxyCODONE-acetaminophen (PERCOCET/ROXICET) 5-325 MG tablet Take 1-2 tablets by mouth every 6 (six) hours as needed for severe pain. 07/31/22   Charlesetta Shanks, MD  pantoprazole (PROTONIX) 40 MG tablet TAKE 1 TABLET BY MOUTH EVERY DAY Patient not taking: Reported on 06/02/2022 10/05/15   Midge Minium, MD  Probiotic Product (ALIGN PO) Take by mouth.    [provider]  rosuvastatin (CRESTOR) 10 MG tablet Take 1 tablet (10 mg total) by mouth daily. 06/19/22   Freada Bergeron, MD  sucralfate (CARAFATE) 1 g tablet Take 1 tablet (1 g total) by mouth 4 (four) times daily -  with meals and at bedtime. Patient not taking: Reported on 06/02/2022 05/01/22   Ezequiel Essex, MD      Allergies    Morphine    Review of Systems   Review of Systems  Cardiovascular:  Positive for chest pain.    Physical Exam Updated Vital Signs BP (!) 144/75   Pulse 80   Temp 97.9 F (36.6 C) (Oral)   Resp 16   Ht 5\' 4"  (1.626  m)   Wt 70.1 kg   SpO2 98%   BMI 26.54 kg/m  Physical Exam Vitals and nursing note reviewed.  Constitutional:      General: She is not in acute distress.    Appearance: She is well-developed. She is not diaphoretic.  HENT:     Head: Normocephalic and atraumatic.  Eyes:     Pupils: Pupils are equal, round, and reactive to light.  Cardiovascular:     Rate and Rhythm: Normal rate and regular rhythm.     Heart sounds: No murmur heard.    No friction rub. No gallop.  Pulmonary:     Effort: Pulmonary effort is normal.     Breath sounds: No wheezing or rales.  Chest:     Comments: The patient winces when I push on her right chest  but says it does not hurt. Abdominal:     General: There is no distension.     Palpations: Abdomen is soft.     Tenderness: There is abdominal tenderness.     Comments: Pain diffusely to the upper abdomen  Musculoskeletal:        General: No tenderness.     Cervical back: Normal range of motion and neck supple.  Skin:    General: Skin is warm and dry.  Neurological:     Mental Status: She is alert and oriented to person, place, and time.  Psychiatric:        Behavior: Behavior normal.     ED Results / Procedures / Treatments   Labs (all labs ordered are listed, but only abnormal results are displayed) Labs Reviewed  COMPREHENSIVE METABOLIC PANEL - Abnormal; Notable for the following components:      Result Value   Glucose, Bld 103 (*)    Calcium 8.6 (*)    Alkaline Phosphatase 129 (*)    All other components within normal limits  LIPASE, BLOOD - Abnormal; Notable for the following components:   Lipase 88 (*)    All other components within normal limits  CBC WITH DIFFERENTIAL/PLATELET  TROPONIN I (HIGH SENSITIVITY)    EKG EKG Interpretation  Date/Time:  Saturday August 22 2022 08:16:49 EST Ventricular Rate:  74 PR Interval:  152 QRS Duration: 81 QT Interval:  392 QTC Calculation: 435 R Axis:   70 Text Interpretation: Sinus rhythm No significant change since last tracing Confirmed by Deno Etienne [phone]) on 08/22/2022 8:27:34 AM  Radiology DG Chest Port 1 View  Result Date: 08/22/2022 CLINICAL DATA:  Cough and chest pain EXAM: PORTABLE CHEST 1 VIEW COMPARISON:  05/01/2022 FINDINGS: Normal heart size and mediastinal contours. No acute infiltrate or edema. No effusion or pneumothorax. No acute osseous findings. Artifact from EKG leads. IMPRESSION: No active disease. Electronically Signed   By: Jorje Guild M.D.   On: 08/22/2022 08:46    Procedures Procedures    Medications Ordered in ED Medications  alum & mag hydroxide-simeth (MAALOX/MYLANTA) 200-200-20 MG/5ML  suspension 30 mL (30 mLs Oral Given 08/22/22 7846)    ED Course/ Medical Decision Making/ A&P                             Medical Decision Making Amount and/or Complexity of Data Reviewed Labs: ordered. Radiology: ordered.  Risk OTC drugs.   28 yoF with the chief complaints of right-sided chest discomfort.  Atypical in nature, going on since last night.  Not exertional.  Associated with an upper respiratory illness.  Will obtain a chest x-ray to assess for pneumonia.  Single troponin.  She does have some epigastric abdominal discomfort on exam will obtain liver function test and a lipase.  GI cocktail.  Reassess.  Troponin negative, no leukocytosis no anemia, no significant LFT elevation.  Patient's lipase is in the indiscriminate range at 88.  Patient symptoms not obviously consistent with pancreatitis.  Will treat supportively.  PCP follow-up.  9:28 AM:  I have discussed the diagnosis/risks/treatment options with the patient.  Evaluation and diagnostic testing in the emergency department does not suggest an emergent condition requiring admission or immediate intervention beyond what has been performed at this time.  They will follow up with PCP. We also discussed returning to the ED immediately if new or worsening sx occur. We discussed the sx which are most concerning (e.g., sudden worsening pain, fever, inability to tolerate by mouth) that necessitate immediate return. Medications administered to the patient during their visit and any new prescriptions provided to the patient are listed below.  Medications given during this visit Medications  alum & mag hydroxide-simeth (MAALOX/MYLANTA) 200-200-20 MG/5ML suspension 30 mL (30 mLs Oral Given 08/22/22 0835)     The patient appears reasonably screen and/or stabilized for discharge and I doubt any other medical condition or other [facility] requiring further screening, evaluation, or treatment in the ED at this time prior to discharge.           Final Clinical Impression(s) / ED Diagnoses Final diagnoses:  Viral URI with cough  Nonspecific chest pain    Rx / DC Orders ED Discharge Orders     None         Deno Etienne, DO 08/22/22 9702

## 2022-11-18 ENCOUNTER — Ambulatory Visit: Payer: Medicare Other | Attending: Cardiology

## 2022-11-18 DIAGNOSIS — Z79899 Other long term (current) drug therapy: Secondary | ICD-10-CM

## 2022-11-18 DIAGNOSIS — I2510 Atherosclerotic heart disease of native coronary artery without angina pectoris: Secondary | ICD-10-CM

## 2022-11-18 DIAGNOSIS — E785 Hyperlipidemia, unspecified: Secondary | ICD-10-CM

## 2022-11-18 LAB — LIPID PANEL
Chol/HDL Ratio: 2.1 ratio (ref 0.0–4.4)
Cholesterol, Total: 163 mg/dL (ref 100–199)
HDL: 76 mg/dL (ref >39)
LDL Chol Calc (NIH): 68 mg/dL (ref 0–99)
Triglycerides: 110 mg/dL (ref 0–149)
VLDL Cholesterol Cal: 19 mg/dL (ref 5–40)

## 2023-03-05 ENCOUNTER — Other Ambulatory Visit: Payer: Self-pay

## 2023-03-05 DIAGNOSIS — I2510 Atherosclerotic heart disease of native coronary artery without angina pectoris: Secondary | ICD-10-CM

## 2023-03-05 DIAGNOSIS — E785 Hyperlipidemia, unspecified: Secondary | ICD-10-CM

## 2023-03-05 DIAGNOSIS — Z79899 Other long term (current) drug therapy: Secondary | ICD-10-CM

## 2023-03-05 MED ORDER — ROSUVASTATIN CALCIUM 10 MG PO TABS: 10.0000 mg | ORAL_TABLET | Freq: Every day | ORAL | 1 refills | Status: DC

## 2023-03-16 ENCOUNTER — Encounter (HOSPITAL_BASED_OUTPATIENT_CLINIC_OR_DEPARTMENT_OTHER): Payer: Self-pay | Admitting: Emergency Medicine

## 2023-03-16 ENCOUNTER — Emergency Department (HOSPITAL_BASED_OUTPATIENT_CLINIC_OR_DEPARTMENT_OTHER)
Admission: EM | Admit: 2023-03-16 | Discharge: 2023-03-16 | Disposition: A | Discharge status: Home / Self Care | Payer: Medicare Other | Attending: Emergency Medicine | Admitting: Emergency Medicine

## 2023-03-16 ENCOUNTER — Other Ambulatory Visit: Payer: Self-pay

## 2023-03-16 DIAGNOSIS — F419 Anxiety disorder, unspecified: Secondary | ICD-10-CM | POA: Insufficient documentation

## 2023-03-16 DIAGNOSIS — R42 Dizziness and giddiness: Secondary | ICD-10-CM | POA: Diagnosis present

## 2023-03-16 DIAGNOSIS — Z79899 Other long term (current) drug therapy: Secondary | ICD-10-CM | POA: Insufficient documentation

## 2023-03-16 LAB — URINALYSIS, MICROSCOPIC (REFLEX)
RBC / HPF: NONE SEEN RBC/hpf (ref 0–5)
WBC, UA: NONE SEEN WBC/hpf (ref 0–5)

## 2023-03-16 LAB — URINALYSIS, ROUTINE W REFLEX MICROSCOPIC
Bilirubin Urine: NEGATIVE
Glucose, UA: NEGATIVE mg/dL
Ketones, ur: NEGATIVE mg/dL
Leukocytes,Ua: NEGATIVE
Nitrite: NEGATIVE
Protein, ur: NEGATIVE mg/dL
Specific Gravity, Urine: 1.015 (ref 1.005–1.030)
pH: 6 (ref 5.0–8.0)

## 2023-03-16 LAB — BASIC METABOLIC PANEL
Anion gap: 12 (ref 5–15)
BUN: 19 mg/dL (ref 8–23)
CO2: 21 mmol/L — ABNORMAL LOW (ref 22–32)
Calcium: 9.3 mg/dL (ref 8.9–10.3)
Chloride: 111 mmol/L (ref 98–111)
Creatinine, Ser: 0.66 mg/dL (ref 0.44–1.00)
GFR, Estimated: >60 mL/min (ref >60)
Glucose, Bld: 104 mg/dL — ABNORMAL HIGH (ref 70–99)
Potassium: 3.8 mmol/L (ref 3.5–5.1)
Sodium: 144 mmol/L (ref 135–145)

## 2023-03-16 LAB — CBC
HCT: 40.6 % (ref 36.0–46.0)
Hemoglobin: 14 g/dL (ref 12.0–15.0)
MCH: 31.2 pg (ref 26.0–34.0)
MCHC: 34.5 g/dL (ref 30.0–36.0)
MCV: 90.4 fL (ref 80.0–100.0)
Platelets: 236 10*3/uL (ref 150–400)
RBC: 4.49 MIL/uL (ref 3.87–5.11)
RDW: 12.8 % (ref 11.5–15.5)
WBC: 9.4 10*3/uL (ref 4.0–10.5)
nRBC: 0 % (ref 0.0–0.2)

## 2023-03-16 LAB — CBG MONITORING, ED: Glucose-Capillary: 96 mg/dL (ref 70–99)

## 2023-03-16 NOTE — ED Provider Notes (Signed)
[place] [facility] Provider Note   CSN: [id number] Arrival date & time: 03/16/23  1659     History  Chief Complaint  Patient presents with   Dizziness    Megan Singleton is a 72 y.o. female.   Dizziness Patient presents with dizziness for months.  States that whenever she takes a medicine for the morning.  Feels lightheaded and dizziness.  States she does not have medicines that are doing it.  States because of this she stopped taking all her medicines today.  Is on several different medicines that could cause dizziness.  No recent change.  Is on antiseizure medicine.  Patient states she did not know that she has had seizures.    Past Medical History:  Diagnosis Date   Alcohol abuse    Allergy    Depression    Fibromyalgia    GERD (gastroesophageal reflux disease)    Hashimoto's thyroiditis    Hyperlipidemia    IBS (irritable bowel syndrome)    Osteopenia    Prolonged QT interval    RLS (restless legs syndrome)    Seizure disorder (HCC)    she is on life long medication per Neuro and can not have driving privileges if she  stops meds   Seizures (HCC)    Thyroid disease     Home Medications Prior to Admission medications   Medication Sig Start Date End Date Taking? Authorizing Provider  benzonatate (TESSALON) 100 MG capsule Take 1 capsule (100 mg total) by mouth every 8 (eight) hours. Patient not taking: Reported on 06/02/2022 03/22/21   Pollyann Savoy, MD  Cyanocobalamin (B-12 PO) Take by mouth.    [provider]  diltiazem (CARDIZEM CD) 120 MG 24 hr capsule Take 1 capsule (120 mg total) by mouth daily. 06/02/22   Meriam Sprague, MD  gabapentin (NEURONTIN) 600 MG tablet Take 1,200 mg by mouth at bedtime.    [provider]  hydrOXYzine (ATARAX/VISTARIL) 25 MG tablet Take 1 tablet (25 mg total) by mouth every 8 (eight) hours as needed for anxiety. 03/22/21   Pollyann Savoy, MD  levETIRAcetam  (KEPPRA) 100 MG/ML solution Take 10 mLs by mouth in the morning and at bedtime. 05/10/20   [provider]  levothyroxine (SYNTHROID) 125 MCG tablet Take 125 mcg by mouth daily before breakfast.    [provider]  methylPREDNISolone (MEDROL DOSEPAK) 4 MG TBPK tablet Per dose pack instructions starting 08/01/2022 07/31/22   Arby Barrette, MD  oxyCODONE-acetaminophen (PERCOCET/ROXICET) 5-325 MG tablet Take 1-2 tablets by mouth every 6 (six) hours as needed for severe pain. 07/31/22   Arby Barrette, MD  pantoprazole (PROTONIX) 40 MG tablet TAKE 1 TABLET BY MOUTH EVERY DAY Patient not taking: Reported on 06/02/2022 10/05/15   Sheliah Hatch, MD  Probiotic Product (ALIGN PO) Take by mouth.    [provider]  rosuvastatin (CRESTOR) 10 MG tablet Take 1 tablet (10 mg total) by mouth daily. 03/05/23   Jake Bathe, MD  sucralfate (CARAFATE) 1 g tablet Take 1 tablet (1 g total) by mouth 4 (four) times daily -  with meals and at bedtime. Patient not taking: Reported on 06/02/2022 05/01/22   Glynn Octave, MD      Allergies    Morphine    Review of Systems   Review of Systems  Neurological:  Positive for dizziness.    Physical Exam Updated Vital Signs BP 136/76   Pulse 86   Temp 98.3  F (36.8 C)   Resp 17   Wt 63.5 kg   SpO2 93%   BMI 24.03 kg/m  Physical Exam Vitals reviewed.  Constitutional:      Appearance: Normal appearance.  Cardiovascular:     Rate and Rhythm: Regular rhythm.  Musculoskeletal:     Cervical back: Neck supple.  Skin:    General: Skin is warm.     Capillary Refill: Capillary refill takes less than 2 seconds.  Neurological:     Mental Status: She is alert and oriented to person, place, and time. Mental status is at baseline.     ED Results / Procedures / Treatments   Labs (all labs ordered are listed, but only abnormal results are displayed) Labs Reviewed  BASIC METABOLIC PANEL - Abnormal; Notable for the following  components:      Result Value   CO2 21 (*)    Glucose, Bld 104 (*)    All other components within normal limits  URINALYSIS, ROUTINE W REFLEX MICROSCOPIC - Abnormal; Notable for the following components:   Hgb urine dipstick TRACE (*)    All other components within normal limits  URINALYSIS, MICROSCOPIC (REFLEX) - Abnormal; Notable for the following components:   Bacteria, UA RARE (*)    All other components within normal limits  CBC  CBG MONITORING, ED    EKG EKG Interpretation Date/Time:  Tuesday March 16 2023 17:05:40 EDT Ventricular Rate:  83 PR Interval:  162 QRS Duration:  86 QT Interval:  402 QTC Calculation: 473 R Axis:   76  Text Interpretation: Sinus rhythm Low voltage, precordial leads Confirmed by Benjiman Core [phone]) on 03/16/2023 6:36:43 PM  Radiology No results found.  Procedures Procedures    Medications Ordered in ED Medications - No data to display  ED Course/ Medical Decision Making/ A&P                                 Medical Decision Making Amount and/or Complexity of Data Reviewed Labs: ordered.   Patient with dizziness.  History of same over months.  Worse with medicines.  Reviewed notes and has been seen for this similar in the past.  Patient denies seizures but reviewing the neurology notes has had partial seizures.  There is a note that states she needs to be on lifelong antiseizure medicine.  Blood work overall reassuring.  Does have anxiety also.  Her Myrbetriq potentially could be a cause of some dizziness.  I think is reasonable to stop this medicine for now since coming off it would likely only cause incontinence or urgency.  Will need to follow-up with PCP.  Reviewed TSH which 2 months ago was normal.        Final Clinical Impression(s) / ED Diagnoses Final diagnoses:  Lightheadedness    Rx / DC Orders ED Discharge Orders     None         Benjiman Core, MD 03/16/23 1916

## 2023-03-16 NOTE — Discharge Instructions (Signed)
Hold the Mybetriq for now.  Follow-up with her doctor to figure out other potential medicines that could be causing your dizziness.  Your workup today was overall reassuring.

## 2023-03-16 NOTE — ED Triage Notes (Addendum)
Pt reports dizziness worse than usual ,the dizziness she experiences daily is caused by her meds. Decided  this morning to not take all her meds in order to know which medication causes dizziness .  Dizziness got worse , felt anxious ,, mid chest pain this morning which was relieved prior to arrival .   Alert and oriented x 4 . Ambulatory to triage .  Adds had 2 shots of whiskey with ginger ale

## 2023-04-03 ENCOUNTER — Emergency Department (HOSPITAL_BASED_OUTPATIENT_CLINIC_OR_DEPARTMENT_OTHER): Payer: Medicare Other

## 2023-04-03 ENCOUNTER — Emergency Department (HOSPITAL_BASED_OUTPATIENT_CLINIC_OR_DEPARTMENT_OTHER)
Admission: EM | Admit: 2023-04-03 | Discharge: 2023-04-03 | Disposition: A | Discharge status: Home / Self Care | Payer: Medicare Other | Attending: Emergency Medicine | Admitting: Emergency Medicine

## 2023-04-03 ENCOUNTER — Other Ambulatory Visit: Payer: Self-pay

## 2023-04-03 DIAGNOSIS — R000 Tachycardia, unspecified: Secondary | ICD-10-CM | POA: Diagnosis not present

## 2023-04-03 DIAGNOSIS — R1084 Generalized abdominal pain: Secondary | ICD-10-CM | POA: Insufficient documentation

## 2023-04-03 DIAGNOSIS — R109 Unspecified abdominal pain: Secondary | ICD-10-CM | POA: Diagnosis present

## 2023-04-03 DIAGNOSIS — G2571 Drug induced akathisia: Secondary | ICD-10-CM | POA: Insufficient documentation

## 2023-04-03 DIAGNOSIS — I700 Atherosclerosis of aorta: Secondary | ICD-10-CM | POA: Insufficient documentation

## 2023-04-03 DIAGNOSIS — R932 Abnormal findings on diagnostic imaging of liver and biliary tract: Secondary | ICD-10-CM | POA: Insufficient documentation

## 2023-04-03 DIAGNOSIS — T434X5A Adverse effect of butyrophenone and thiothixene neuroleptics, initial encounter: Secondary | ICD-10-CM | POA: Insufficient documentation

## 2023-04-03 DIAGNOSIS — R079 Chest pain, unspecified: Secondary | ICD-10-CM | POA: Insufficient documentation

## 2023-04-03 DIAGNOSIS — Z79899 Other long term (current) drug therapy: Secondary | ICD-10-CM | POA: Diagnosis not present

## 2023-04-03 DIAGNOSIS — K5730 Diverticulosis of large intestine without perforation or abscess without bleeding: Secondary | ICD-10-CM | POA: Insufficient documentation

## 2023-04-03 DIAGNOSIS — Z98891 History of uterine scar from previous surgery: Secondary | ICD-10-CM | POA: Diagnosis not present

## 2023-04-03 LAB — LIPASE, BLOOD: Lipase: 29 U/L (ref 11–51)

## 2023-04-03 LAB — CBC WITH DIFFERENTIAL/PLATELET
Abs Immature Granulocytes: 0.03 10*3/uL (ref 0.00–0.07)
Basophils Absolute: 0.1 10*3/uL (ref 0.0–0.1)
Basophils Relative: 1 %
Eosinophils Absolute: 0 10*3/uL (ref 0.0–0.5)
Eosinophils Relative: 0 %
HCT: 42.3 % (ref 36.0–46.0)
Hemoglobin: 15 g/dL (ref 12.0–15.0)
Immature Granulocytes: 0 %
Lymphocytes Relative: 34 %
Lymphs Abs: 3.4 10*3/uL (ref 0.7–4.0)
MCH: 31.7 pg (ref 26.0–34.0)
MCHC: 35.5 g/dL (ref 30.0–36.0)
MCV: 89.4 fL (ref 80.0–100.0)
Monocytes Absolute: 0.5 10*3/uL (ref 0.1–1.0)
Monocytes Relative: 5 %
Neutro Abs: 6 10*3/uL (ref 1.7–7.7)
Neutrophils Relative %: 60 %
Platelets: 246 10*3/uL (ref 150–400)
RBC: 4.73 MIL/uL (ref 3.87–5.11)
RDW: 12.3 % (ref 11.5–15.5)
WBC: 10 10*3/uL (ref 4.0–10.5)
nRBC: 0 % (ref 0.0–0.2)

## 2023-04-03 LAB — URINALYSIS, MICROSCOPIC (REFLEX): Bacteria, UA: NONE SEEN

## 2023-04-03 LAB — COMPREHENSIVE METABOLIC PANEL
ALT: 17 U/L (ref 0–44)
AST: 19 U/L (ref 15–41)
Albumin: 4.7 g/dL (ref 3.5–5.0)
Alkaline Phosphatase: 78 U/L (ref 38–126)
Anion gap: 13 (ref 5–15)
BUN: 14 mg/dL (ref 8–23)
CO2: 21 mmol/L — ABNORMAL LOW (ref 22–32)
Calcium: 9.3 mg/dL (ref 8.9–10.3)
Chloride: 103 mmol/L (ref 98–111)
Creatinine, Ser: 0.74 mg/dL (ref 0.44–1.00)
GFR, Estimated: >60 mL/min (ref >60)
Glucose, Bld: 124 mg/dL — ABNORMAL HIGH (ref 70–99)
Potassium: 3.4 mmol/L — ABNORMAL LOW (ref 3.5–5.1)
Sodium: 137 mmol/L (ref 135–145)
Total Bilirubin: 1 mg/dL (ref 0.3–1.2)
Total Protein: 7.3 g/dL (ref 6.5–8.1)

## 2023-04-03 LAB — TSH: TSH: 6.104 u[IU]/mL — ABNORMAL HIGH (ref 0.350–4.500)

## 2023-04-03 LAB — URINALYSIS, ROUTINE W REFLEX MICROSCOPIC
Bilirubin Urine: NEGATIVE
Glucose, UA: NEGATIVE mg/dL
Ketones, ur: 80 mg/dL — AB
Nitrite: NEGATIVE
Protein, ur: NEGATIVE mg/dL
Specific Gravity, Urine: 1.01 (ref 1.005–1.030)
pH: 6.5 (ref 5.0–8.0)

## 2023-04-03 LAB — TROPONIN I (HIGH SENSITIVITY)
Troponin I (High Sensitivity): 3 ng/L (ref <18)
Troponin I (High Sensitivity): 4 ng/L (ref <18)

## 2023-04-03 MED ORDER — IOHEXOL 300 MG/ML  SOLN
100.0000 mL | Freq: Once | INTRAMUSCULAR | Status: AC | PRN
  Administered 2023-04-03: 100 mL via INTRAVENOUS

## 2023-04-03 MED ORDER — DIPHENHYDRAMINE HCL 50 MG/ML IJ SOLN
50.0000 mg | Freq: Once | INTRAMUSCULAR | Status: AC
  Administered 2023-04-03: 50 mg via INTRAVENOUS
  Filled 2023-04-03: qty 1

## 2023-04-03 MED ORDER — HALOPERIDOL LACTATE 5 MG/ML IJ SOLN
2.0000 mg | Freq: Once | INTRAMUSCULAR | Status: AC
  Administered 2023-04-03: 2 mg via INTRAVENOUS
  Filled 2023-04-03: qty 1

## 2023-04-03 NOTE — ED Triage Notes (Signed)
Patient presents to ED via POV from home. Here with abdominal pain that began this afternoon. Reports nausea. Denies vomiting.

## 2023-04-03 NOTE — ED Provider Notes (Signed)
[facility] Provider Note   CSN: [id number] Arrival date & time: 04/03/23  1726     History  Chief Complaint  Patient presents with   Abdominal Pain    INELLA KENDAL is a 72 y.o. female.  This is a 72 year old female is here today for abdominal pain which she describes as burning.  Symptoms began this morning.  She has not had any vomiting or diarrhea.  Only intra-abdominal surgeries are C-section.   Abdominal Pain      Home Medications Prior to Admission medications   Medication Sig Start Date End Date Taking? Authorizing Provider  benzonatate (TESSALON) 100 MG capsule Take 1 capsule (100 mg total) by mouth every 8 (eight) hours. Patient not taking: Reported on 06/02/2022 03/22/21   Pollyann Savoy, MD  Cyanocobalamin (B-12 PO) Take by mouth.    [provider]  diltiazem (CARDIZEM CD) 120 MG 24 hr capsule Take 1 capsule (120 mg total) by mouth daily. 06/02/22   Meriam Sprague, MD  gabapentin (NEURONTIN) 600 MG tablet Take 1,200 mg by mouth at bedtime.    [provider]  hydrOXYzine (ATARAX/VISTARIL) 25 MG tablet Take 1 tablet (25 mg total) by mouth every 8 (eight) hours as needed for anxiety. 03/22/21   Pollyann Savoy, MD  levETIRAcetam (KEPPRA) 100 MG/ML solution Take 10 mLs by mouth in the morning and at bedtime. 05/10/20   [provider]  levothyroxine (SYNTHROID) 125 MCG tablet Take 125 mcg by mouth daily before breakfast.    [provider]  methylPREDNISolone (MEDROL DOSEPAK) 4 MG TBPK tablet Per dose pack instructions starting 08/01/2022 07/31/22   Arby Barrette, MD  oxyCODONE-acetaminophen (PERCOCET/ROXICET) 5-325 MG tablet Take 1-2 tablets by mouth every 6 (six) hours as needed for severe pain. 07/31/22   Arby Barrette, MD  pantoprazole (PROTONIX) 40 MG tablet TAKE 1 TABLET BY MOUTH EVERY DAY Patient not taking: Reported on 06/02/2022 10/05/15   Sheliah Hatch, MD   Probiotic Product (ALIGN PO) Take by mouth.    [provider]  rosuvastatin (CRESTOR) 10 MG tablet Take 1 tablet (10 mg total) by mouth daily. 03/05/23   Jake Bathe, MD  sucralfate (CARAFATE) 1 g tablet Take 1 tablet (1 g total) by mouth 4 (four) times daily -  with meals and at bedtime. Patient not taking: Reported on 06/02/2022 05/01/22   Glynn Octave, MD      Allergies    Morphine and Haloperidol and related    Review of Systems   Review of Systems  Gastrointestinal:  Positive for abdominal pain.    Physical Exam Updated Vital Signs BP (!) 155/71 (BP Location: Right Arm)   Pulse 87   Temp 98 F (36.7 C) (Oral)   Resp 20   Ht 5\' 4"  (1.626 m)   Wt 63.5 kg   SpO2 98%   BMI 24.03 kg/m  Physical Exam Vitals reviewed.  Constitutional:      General: She is not in acute distress.    Appearance: She is not ill-appearing.  Eyes:     Extraocular Movements: Extraocular movements intact.  Cardiovascular:     Rate and Rhythm: Regular rhythm. Tachycardia present.     Heart sounds: No murmur heard. Pulmonary:     Effort: Pulmonary effort is normal. No respiratory distress.  Abdominal:     General: Abdomen is flat. There is no distension. There are no signs of injury.     Palpations: Abdomen  is soft.     Tenderness: There is no abdominal tenderness.     Hernia: No hernia is present.  Skin:    General: Skin is warm and dry.  Neurological:     General: No focal deficit present.     ED Results / Procedures / Treatments   Labs (all labs ordered are listed, but only abnormal results are displayed) Labs Reviewed  COMPREHENSIVE METABOLIC PANEL - Abnormal; Notable for the following components:      Result Value   Potassium 3.4 (*)    CO2 21 (*)    Glucose, Bld 124 (*)    All other components within normal limits  URINALYSIS, ROUTINE W REFLEX MICROSCOPIC - Abnormal; Notable for the following components:   Hgb urine dipstick SMALL (*)    Ketones, ur 80 (*)     Leukocytes,Ua TRACE (*)    All other components within normal limits  LIPASE, BLOOD  CBC WITH DIFFERENTIAL/PLATELET  URINALYSIS, MICROSCOPIC (REFLEX)  CBC WITH DIFFERENTIAL/PLATELET  TSH  TROPONIN I (HIGH SENSITIVITY)  TROPONIN I (HIGH SENSITIVITY)    EKG EKG Interpretation Date/Time:  Saturday April 03 2023 18:30:16 EDT Ventricular Rate:  99 PR Interval:  189 QRS Duration:  85 QT Interval:  370 QTC Calculation: 475 R Axis:   67  Text Interpretation: Sinus rhythm Confirmed by Anders Simmonds [phone]) on 04/03/2023 6:32:07 PM  Radiology CT ABDOMEN PELVIS W CONTRAST  Result Date: 04/03/2023 CLINICAL DATA:  Abdominal pain, nausea EXAM: CT ABDOMEN AND PELVIS WITH CONTRAST TECHNIQUE: Multidetector CT imaging of the abdomen and pelvis was performed using the standard protocol following bolus administration of intravenous contrast. RADIATION DOSE REDUCTION: This exam was performed according to the departmental dose-optimization program which includes automated exposure control, adjustment of the mA and/or kV according to patient size and/or use of iterative reconstruction technique. CONTRAST:  OMNIPAQUE IOHEXOL 300 MG/ML  SOLN COMPARISON:  CTA abdomen/pelvis dated 05/01/2022 FINDINGS: Lower chest: Lung bases are clear. Hepatobiliary: Liver is notable for a mildly nodular hepatic contour. Gallbladder is unremarkable. No intrahepatic or extrahepatic duct dilatation. Pancreas: Within normal limits. Spleen: Within normal limits. Adrenals/Urinary Tract: Adrenal glands are within normal limits. Left renal sinus cysts, benign. No follow-up is recommended. Kidneys are otherwise within normal limits. No hydronephrosis Bladder is within normal limits. Stomach/Bowel: Stomach is within normal limits. No evidence of bowel obstruction. Normal appendix (series 2/image 51). Scattered colonic diverticulosis, without evidence of diverticulitis. Vascular/Lymphatic: No evidence of abdominal aortic aneurysm.  Mild atherosclerotic calcifications abdominal aorta. No suspicious abdominopelvic lymphadenopathy. Reproductive: Uterus is within normal limits. Bilateral ovaries are within normal limits. Other: No abdominopelvic ascites. Musculoskeletal: Mild degenerative changes of the visualized thoracolumbar spine. IMPRESSION: No CT findings to account for the patient's abdominal pain. Scattered colonic diverticulosis, without evidence of diverticulitis. Mildly nodular hepatic contour, raising the possibility of cirrhosis. Electronically Signed   By: Charline Bills M.D.   On: 04/03/2023 20:30    Procedures Procedures    Medications Ordered in ED Medications  haloperidol lactate (HALDOL) injection 2 mg (2 mg Intravenous Given 04/03/23 1843)  diphenhydrAMINE (BENADRYL) injection 50 mg (50 mg Intravenous Given 04/03/23 1905)  iohexol (OMNIPAQUE) 300 MG/ML solution 100 mL (100 mLs Intravenous Contrast Given 04/03/23 2021)    ED Course/ Medical Decision Making/ A&P                                 Medical Decision Making This is a  72 year old female is here today for burning in her abdomen.  Differential diagnoses include gastritis, GERD, ACS, cystitis, less likely aortic pathology, medication changes.  Plan-EKG, troponin ordered on the patient case this is an unusual presentation of ischemia.  CT imaging abdomen pelvis ordered.  Patient's abdomen overall soft.  CBC and BMP ordered.  Urinalysis ordered.  Husband is with patient informing that she has made some changes in the timing that she is taking some of her home medications.  Could possibly contributing to the symptoms.  My independent review the patient's EKG shows no ST segment depressions or elevation, no evidence of acute ischemia.  Reassessment-patient CT imaging negative.  Reviewed patient's labs, no leukocytosis, normal urinalysis.  Symptoms are a bit improved.  She did have a mild akathisia reaction to Haldol.  Responded well to Benadryl.  Will  discharge patient, have her follow-up with her PCP.  Amount and/or Complexity of Data Reviewed Labs: ordered. Radiology: ordered.  Risk Prescription drug management.           Final Clinical Impression(s) / ED Diagnoses Final diagnoses:  Generalized abdominal pain    Rx / DC Orders ED Discharge Orders     None         Arletha Pili, DO 04/03/23 2144

## 2023-04-03 NOTE — Discharge Instructions (Addendum)
While you are in the emergency department, you had labs and imaging done that were normal.  At this time I do not have a clear cause for this causing your symptoms.  It may be related to your medications.  You should discuss this with your primary care doctor.  I would set up a follow-up appointment with them this week to discuss this.

## 2023-04-03 NOTE — ED Notes (Signed)
Patient transported to CT 

## 2023-04-29 ENCOUNTER — Other Ambulatory Visit: Payer: Self-pay

## 2023-04-29 MED ORDER — DILTIAZEM HCL ER COATED BEADS 120 MG PO CP24: 120.0000 mg | ORAL_CAPSULE | Freq: Every day | ORAL | 0 refills | Status: DC

## 2023-06-21 ENCOUNTER — Encounter: Payer: Self-pay | Admitting: Cardiovascular Disease

## 2023-06-21 NOTE — Progress Notes (Signed)
    Pt cancelle   This encounter was created in error - please disregard.

## 2023-06-22 ENCOUNTER — Ambulatory Visit: Payer: Medicare Other | Admitting: Cardiovascular Disease

## 2023-08-02 ENCOUNTER — Other Ambulatory Visit (HOSPITAL_BASED_OUTPATIENT_CLINIC_OR_DEPARTMENT_OTHER): Payer: Self-pay

## 2023-08-02 ENCOUNTER — Emergency Department (HOSPITAL_BASED_OUTPATIENT_CLINIC_OR_DEPARTMENT_OTHER): Payer: Medicare Other

## 2023-08-02 ENCOUNTER — Emergency Department (HOSPITAL_BASED_OUTPATIENT_CLINIC_OR_DEPARTMENT_OTHER)
Admission: EM | Admit: 2023-08-02 | Discharge: 2023-08-02 | Disposition: A | Discharge status: Home / Self Care | Payer: Medicare Other | Attending: Emergency Medicine | Admitting: Emergency Medicine

## 2023-08-02 ENCOUNTER — Encounter (HOSPITAL_BASED_OUTPATIENT_CLINIC_OR_DEPARTMENT_OTHER): Payer: Self-pay | Admitting: Emergency Medicine

## 2023-08-02 ENCOUNTER — Other Ambulatory Visit: Payer: Self-pay

## 2023-08-02 DIAGNOSIS — F419 Anxiety disorder, unspecified: Secondary | ICD-10-CM | POA: Insufficient documentation

## 2023-08-02 DIAGNOSIS — R079 Chest pain, unspecified: Secondary | ICD-10-CM | POA: Insufficient documentation

## 2023-08-02 DIAGNOSIS — F1029 Alcohol dependence with unspecified alcohol-induced disorder: Secondary | ICD-10-CM | POA: Diagnosis not present

## 2023-08-02 LAB — CBC WITH DIFFERENTIAL/PLATELET
Abs Immature Granulocytes: 0.05 10*3/uL (ref 0.00–0.07)
Basophils Absolute: 0 10*3/uL (ref 0.0–0.1)
Basophils Relative: 0 %
Eosinophils Absolute: 0 10*3/uL (ref 0.0–0.5)
Eosinophils Relative: 0 %
HCT: 43.2 % (ref 36.0–46.0)
Hemoglobin: 14.9 g/dL (ref 12.0–15.0)
Immature Granulocytes: 1 %
Lymphocytes Relative: 26 %
Lymphs Abs: 2.8 10*3/uL (ref 0.7–4.0)
MCH: 30.9 pg (ref 26.0–34.0)
MCHC: 34.5 g/dL (ref 30.0–36.0)
MCV: 89.6 fL (ref 80.0–100.0)
Monocytes Absolute: 0.5 10*3/uL (ref 0.1–1.0)
Monocytes Relative: 5 %
Neutro Abs: 7.3 10*3/uL (ref 1.7–7.7)
Neutrophils Relative %: 68 %
Platelets: 241 10*3/uL (ref 150–400)
RBC: 4.82 MIL/uL (ref 3.87–5.11)
RDW: 12.7 % (ref 11.5–15.5)
WBC: 10.7 10*3/uL — ABNORMAL HIGH (ref 4.0–10.5)
nRBC: 0 % (ref 0.0–0.2)

## 2023-08-02 LAB — COMPREHENSIVE METABOLIC PANEL
ALT: 17 U/L (ref 0–44)
AST: 22 U/L (ref 15–41)
Albumin: 4.5 g/dL (ref 3.5–5.0)
Alkaline Phosphatase: 85 U/L (ref 38–126)
Anion gap: 12 (ref 5–15)
BUN: 13 mg/dL (ref 8–23)
CO2: 21 mmol/L — ABNORMAL LOW (ref 22–32)
Calcium: 9.3 mg/dL (ref 8.9–10.3)
Chloride: 106 mmol/L (ref 98–111)
Creatinine, Ser: 0.65 mg/dL (ref 0.44–1.00)
GFR, Estimated: >60 mL/min (ref >60)
Glucose, Bld: 125 mg/dL — ABNORMAL HIGH (ref 70–99)
Potassium: 3.7 mmol/L (ref 3.5–5.1)
Sodium: 139 mmol/L (ref 135–145)
Total Bilirubin: 0.7 mg/dL (ref 0.0–1.2)
Total Protein: 7.2 g/dL (ref 6.5–8.1)

## 2023-08-02 LAB — TROPONIN I (HIGH SENSITIVITY): Troponin I (High Sensitivity): 3 ng/L (ref <18)

## 2023-08-02 MED ORDER — LORAZEPAM 2 MG/ML IJ SOLN
1.0000 mg | Freq: Once | INTRAMUSCULAR | Status: AC
  Administered 2023-08-02: 1 mg via INTRAVENOUS
  Filled 2023-08-02: qty 1

## 2023-08-02 MED ORDER — CHLORDIAZEPOXIDE HCL 25 MG PO CAPS
ORAL_CAPSULE | ORAL | 0 refills | Status: AC
  Filled 2023-08-02: qty 9, 3d supply, fill #0

## 2023-08-02 MED ORDER — LORAZEPAM 2 MG/ML IJ SOLN: 2.0000 mg | Freq: Once | INTRAMUSCULAR | Status: DC

## 2023-08-02 NOTE — Discharge Instructions (Signed)
 Take Librium  as prescribed.  Take the first dose when you get home.  Do not mix alcohol  or drugs or dangerous activities this medication is sedating.  I recommend close follow-up with your primary care doctor to see how you are doing with this taper.  I think he should consider also looking into rehab centers as we discussed.  Please return if symptoms worsen.

## 2023-08-02 NOTE — ED Triage Notes (Signed)
 States midsternal cp started yesterday had some nausea and thought she had sob has hx of panic attacks is shaky

## 2023-08-02 NOTE — ED Provider Notes (Signed)
 [facility] Provider Note   CSN: [id number] Arrival date & time: 08/02/23  9297     History  Chief Complaint  Patient presents with   Chest Pain    Megan Singleton is a 73 y.o. female.  Patient here chest pain, anxiety attack symptoms.  History of anxiety where she takes Vistaril  and some other medications but often uses alcohol .  She has a history of IBS fibromyalgia.  Last drink alcohol  last night.  She does not have any active chest pain nausea vomiting diarrhea.  She is very tremulous today.  Multiple triggers.  She feels safe otherwise.  Nothing makes it worse.  She says that benzodiazepines to help with the past.  The history is provided by the patient.       Home Medications Prior to Admission medications   Medication Sig Start Date End Date Taking? Authorizing Provider  chlordiazePOXIDE  (LIBRIUM ) 25 MG capsule 50mg  PO TID x 1D, then 25 mg PO BID X 1D, then 25 mg PO QD X 1D 08/02/23  Yes Arlen Legendre, DO  benzonatate  (TESSALON ) 100 MG capsule Take 1 capsule (100 mg total) by mouth every 8 (eight) hours. Patient not taking: Reported on 06/02/2022 03/22/21   Roselyn Carlin NOVAK, MD  Cyanocobalamin (B-12 PO) Take by mouth.    [provider]  diltiazem  (CARDIZEM  CD) 120 MG 24 hr capsule Take 1 capsule (120 mg total) by mouth daily. Please keep scheduled appointment for future refills. Thank you. 04/29/23   Nahser, Aleene JINNY, MD  gabapentin  (NEURONTIN ) 600 MG tablet Take 1,200 mg by mouth at bedtime.    [provider]  hydrOXYzine  (ATARAX /VISTARIL ) 25 MG tablet Take 1 tablet (25 mg total) by mouth every 8 (eight) hours as needed for anxiety. 03/22/21   Roselyn Carlin NOVAK, MD  levETIRAcetam  (KEPPRA ) 100 MG/ML solution Take 10 mLs by mouth in the morning and at bedtime. 05/10/20   [provider]  levothyroxine  (SYNTHROID ) 125 MCG tablet Take 125 mcg by mouth daily before breakfast.    [provider]  methylPREDNISolone  (MEDROL  DOSEPAK) 4 MG TBPK tablet Per dose pack instructions starting 08/01/2022 07/31/22   Armenta Canning, MD  oxyCODONE -acetaminophen  (PERCOCET/ROXICET) 5-325 MG tablet Take 1-2 tablets by mouth every 6 (six) hours as needed for severe pain. 07/31/22   Armenta Canning, MD  pantoprazole  (PROTONIX ) 40 MG tablet TAKE 1 TABLET BY MOUTH EVERY DAY Patient not taking: Reported on 06/02/2022 10/05/15   Tabori, Katherine E, MD  Probiotic Product (ALIGN PO) Take by mouth.    [provider]  rosuvastatin  (CRESTOR ) 10 MG tablet Take 1 tablet (10 mg total) by mouth daily. 03/05/23   Jeffrie Oneil BROCKS, MD  sucralfate  (CARAFATE ) 1 g tablet Take 1 tablet (1 g total) by mouth 4 (four) times daily -  with meals and at bedtime. Patient not taking: Reported on 06/02/2022 05/01/22   Carita Senior, MD      Allergies    Morphine and Haloperidol  and related    Review of Systems   Review of Systems  Physical Exam Updated Vital Signs BP (!) 149/77 (BP Location: Right Arm)   Pulse 100   Temp 98.6 F (37 C)   Resp 20   Ht 5' 4 (1.626 m)   Wt 63.5 kg   SpO2 96%   BMI 24.03 kg/m  Physical Exam Vitals and nursing note reviewed.  Constitutional:      General: She is not in acute distress.  Appearance: She is well-developed.  HENT:     Head: Normocephalic and atraumatic.  Eyes:     Extraocular Movements: Extraocular movements intact.     Conjunctiva/sclera: Conjunctivae normal.     Pupils: Pupils are equal, round, and reactive to light.  Cardiovascular:     Rate and Rhythm: Normal rate and regular rhythm.     Pulses:          Radial pulses are 2+ on the right side and 2+ on the left side.     Heart sounds: Normal heart sounds. No murmur heard. Pulmonary:     Effort: Pulmonary effort is normal. No respiratory distress.     Breath sounds: Normal breath sounds. No decreased breath sounds or wheezing.  Abdominal:     Palpations: Abdomen is soft.     Tenderness: There is  no abdominal tenderness.  Musculoskeletal:        General: No swelling.     Cervical back: Normal range of motion and neck supple.  Skin:    General: Skin is warm and dry.     Capillary Refill: Capillary refill takes less than 2 seconds.  Neurological:     General: No focal deficit present.     Mental Status: She is alert.  Psychiatric:        Mood and Affect: Mood is anxious.     ED Results / Procedures / Treatments   Labs (all labs ordered are listed, but only abnormal results are displayed) Labs Reviewed  CBC WITH DIFFERENTIAL/PLATELET - Abnormal; Notable for the following components:      Result Value   WBC 10.7 (*)    All other components within normal limits  COMPREHENSIVE METABOLIC PANEL - Abnormal; Notable for the following components:   CO2 21 (*)    Glucose, Bld 125 (*)    All other components within normal limits  TROPONIN I (HIGH SENSITIVITY)    EKG EKG Interpretation Date/Time:  Monday August 02 2023 07:11:40 EST Ventricular Rate:  100 PR Interval:    QRS Duration:  80 QT Interval:  355 QTC Calculation: 458 R Axis:   95  Text Interpretation: Normal sinus rhythm Right axis deviation Low voltage, precordial leads Confirmed by Ruthe Cornet [phone]) on 08/02/2023 7:14:55 AM  Radiology DG Chest Portable 1 View Result Date: 08/02/2023 CLINICAL DATA:  Chest pain. EXAM: PORTABLE CHEST 1 VIEW COMPARISON:  08/22/2022. FINDINGS: Linear areas of atelectasis/scarring again noted along the left lower lateral lung zone. Bilateral lung fields are otherwise clear. Bilateral costophrenic angles are clear. Normal cardio-mediastinal silhouette. No acute osseous abnormalities. The soft tissues are within normal limits. IMPRESSION: No active disease. Electronically Signed   By: Ree Molt M.D.   On: 08/02/2023 07:43    Procedures Procedures    Medications Ordered in ED Medications  LORazepam  (ATIVAN ) injection 1 mg (1 mg Intravenous Given 08/02/23 0740)    ED  Course/ Medical Decision Making/ A&P                                 Medical Decision Making Amount and/or Complexity of Data Reviewed Labs: ordered. Radiology: ordered.  Risk Prescription drug management.   [name] is here panic attack symptoms, chest pain.  History of the panic attacks, alcohol  use, fibromyalgia, IBS, seizures.  Sounds like she drinks alcohol  often as well.  Last drank alcohol  last night.  She has anxious on exam with some tremors.  Differential diagnosis likely panic attack versus could be alcohol  related withdrawal but did drink last night she has been having tremors even despite drinking and feels like she is having a panic attack.  She has taken benzodiazepines in the past for this.  Follows both with psychiatry and her primary care doctor.  She denies any active chest pain shortness of breath weakness numbness tingling.  She is not endorsing any self-harm.  Vital signs are unremarkable.  EKG shows sinus rhythm.  No ischemic changes.  Differential diagnosis likely panic attack but will evaluate for ACS electrolyte abnormality anemia infectious process with CBC BMP troponin chest x-ray.  Will give a dose IV Ativan .  We have talked about different meds to help with anxiety but given that she drinks what seems to be fairly often it could be somewhat difficult to manage her anxiety.  I suspect she probably benefit from a benzodiazepine as needed but little complicated with alcohol  use.  Ongoing to give her resources for alcohol  rehab and assuming workup is unremarkable have her follow-up with her primary care doctor to figure out how to manage these attacks but if she can proved to abstain from using alcohol  I think she did have a better response to these meds.  She also follows with psychiatrist as well which I think would be great for her to follow-up with as well.  Overall lab works unremarkable.  No significant anemia, electrolyte abnormality, kidney injury or  leukocytosis.  Chest x-ray negative for infection per my review and interpretation.  Troponin negative per my review and interpretation of labs.  We had discussion about alcohol  and withdrawal and rehabs.  She is motivated to try to stop drinking to help her anxiety and will put her on a Librium  taper and have her follow-up with primary care or consider going to a rehab center to get some more support as well.  Right now is not exhibiting any signs of alcohol  withdrawals.  Ativan  has helped her symptoms.  Patient discharged in good condition.  Understands return precautions.  Educated about Librium .   This chart was dictated using voice recognition software.  Despite best efforts to proofread,  errors can occur which can change the documentation meaning.         Final Clinical Impression(s) / ED Diagnoses Final diagnoses:  Nonspecific chest pain  Alcohol  dependence with unspecified alcohol -induced disorder (HCC)  Anxiety    Rx / DC Orders ED Discharge Orders          Ordered    chlordiazePOXIDE  (LIBRIUM ) 25 MG capsule        08/02/23 0839              Ruthe Cornet, DO 08/02/23 [phone]

## 2023-09-10 ENCOUNTER — Other Ambulatory Visit: Payer: Self-pay

## 2023-09-10 DIAGNOSIS — I2510 Atherosclerotic heart disease of native coronary artery without angina pectoris: Secondary | ICD-10-CM

## 2023-09-10 DIAGNOSIS — Z79899 Other long term (current) drug therapy: Secondary | ICD-10-CM

## 2023-09-10 DIAGNOSIS — E785 Hyperlipidemia, unspecified: Secondary | ICD-10-CM

## 2023-09-10 MED ORDER — ROSUVASTATIN CALCIUM 10 MG PO TABS
10.0000 mg | ORAL_TABLET | Freq: Every day | ORAL | 0 refills | Status: DC
Start: 2023-09-10 — End: 2024-05-08

## 2023-09-22 ENCOUNTER — Telehealth: Payer: Self-pay | Admitting: Pharmacy Technician

## 2023-09-22 NOTE — Telephone Encounter (Signed)
 Former Dr. Shari Prows patient, needs follow-up appt. Staff message sent to scheduling to contact patient to arrange appt for office visit with new provider.   Unable to refill medication until seen by provider.

## 2023-09-22 NOTE — Telephone Encounter (Signed)
 We received this refill request. I scanned it in media under cartia refill.

## 2023-09-23 ENCOUNTER — Telehealth: Payer: Self-pay | Admitting: Cardiovascular Disease

## 2023-09-23 MED ORDER — DILTIAZEM HCL ER COATED BEADS 120 MG PO CP24: 120.0000 mg | ORAL_CAPSULE | Freq: Every day | ORAL | 0 refills | Status: DC

## 2023-09-23 NOTE — Telephone Encounter (Signed)
*  STAT* If patient is at the pharmacy, call can be transferred to refill team.   1. Which medications need to be refilled? (please list name of each medication and dose if known) Cart[facility]   2. Would you like to learn more about the convenience, safety, & potential cost savings by using the [facility]?     3. Are you open to using the [facility] (Type [facility].    4. Which pharmacy/location (including street and city if local pharmacy) is medication to be sent to?[facility]   5. Do they need a 30 day or 90 day supply? 90 days and refill

## 2023-09-23 NOTE — Telephone Encounter (Signed)
 Pt's medication was sent to pt's pharmacy as requested. Confirmation received.

## 2023-10-08 ENCOUNTER — Telehealth: Payer: Self-pay | Admitting: Cardiovascular Disease

## 2023-10-08 MED ORDER — DILTIAZEM HCL ER COATED BEADS 120 MG PO CP24: 120.0000 mg | ORAL_CAPSULE | Freq: Every day | ORAL | 0 refills | Status: DC

## 2023-10-08 NOTE — Telephone Encounter (Signed)
*  STAT* If patient is at the pharmacy, call can be transferred to refill team.   1. Which medications need to be refilled? (please list name of each medication and dose if known)   diltiazem (CARDIZEM CD) 120 MG 24 hr capsule   2. Would you like to learn more about the convenience, safety, & potential cost savings by using the [facility]?   3. Are you open to using the [facility] (Type [facility]. ).  4. Which pharmacy/location (including street and city if local pharmacy) is medication to be sent to?  [facility] - [place], [place] - [address]   5. Do they need a 30 day or 90 day supply?   Patient stated she is completely out of this medication.  Patient has appointment scheduled with Dr. Elease Hashimoto on 4/11.

## 2023-10-08 NOTE — Telephone Encounter (Signed)
 Pt's medication was sent to pt's pharmacy as requested. Confirmation received.

## 2023-10-28 NOTE — Progress Notes (Deleted)
  Cardiology Office Note:  .   Date:  10/28/2023  ID:  [name], DOB 25-Oct-1950, MRN [id number] PCP: Verlon Au, MD  [facility] Providers Cardiologist:  None { Click to update primary MD,subspecialty MD or APP then REFRESH:1}   History of Present Illness: .    October 29, 2023 Megan Singleton is a 73 y.o. female  Previously seen by Dr. Shari Prows,  She is here for her 1 year visit   I am meeting her for the first time today  She was last seen in the office on Nov. 14, 2023  Hx of alcohol abuse, depression, fibromyalgia, HLD , seizure disorder  She was seen for episodes of chest pain  CAC score from Nov. 2023 was 25 ( 53rd percentile for age / sex matched controls   Echo from Dec. 6, 2023 : Normal LV systolic function with EF 60-65%, normal diastolic function Normal RV Normal MV, AV,  TV       ROS: ***  Studies Reviewed: .        *** Risk Assessment/Calculations:   {Does this patient have ATRIAL FIBRILLATION?:[phone]} No BP recorded.  {Refresh Note OR Click here to enter BP  :1}***       Physical Exam:   VS:  There were no vitals taken for this visit.   Wt Readings from Last 3 Encounters:  08/02/23 139 lb 15.9 oz (63.5 kg)  04/03/23 140 lb (63.5 kg)  03/16/23 140 lb (63.5 kg)    GEN: Well nourished, well developed in no acute distress NECK: No JVD; No carotid bruits CARDIAC: ***RRR, no murmurs, rubs, gallops RESPIRATORY:  Clear to auscultation without rales, wheezing or rhonchi  ABDOMEN: Soft, non-tender, non-distended EXTREMITIES:  No edema; No deformity   ASSESSMENT AND PLAN: .   ***    {Are you ordering a CV Procedure (e.g. stress test, cath, DCCV, TEE, etc)?   Press F2        :[id number]}  Dispo: ***  Signed, Kristeen Miss, MD

## 2023-10-29 ENCOUNTER — Ambulatory Visit: Attending: Cardiology | Admitting: Cardiovascular Disease

## 2023-11-01 ENCOUNTER — Encounter: Payer: Self-pay | Admitting: Cardiovascular Disease

## 2023-11-08 ENCOUNTER — Other Ambulatory Visit: Payer: Self-pay

## 2023-11-08 ENCOUNTER — Ambulatory Visit: Attending: Sports Medicine | Admitting: Physical Therapy

## 2023-11-08 ENCOUNTER — Encounter: Payer: Self-pay | Admitting: Physical Therapy

## 2023-11-08 DIAGNOSIS — M6281 Muscle weakness (generalized): Secondary | ICD-10-CM | POA: Diagnosis present

## 2023-11-08 DIAGNOSIS — G8929 Other chronic pain: Secondary | ICD-10-CM | POA: Insufficient documentation

## 2023-11-08 DIAGNOSIS — M25511 Pain in right shoulder: Secondary | ICD-10-CM | POA: Insufficient documentation

## 2023-11-08 DIAGNOSIS — M25611 Stiffness of right shoulder, not elsewhere classified: Secondary | ICD-10-CM | POA: Diagnosis present

## 2023-11-08 DIAGNOSIS — R296 Repeated falls: Secondary | ICD-10-CM | POA: Diagnosis present

## 2023-11-08 DIAGNOSIS — R293 Abnormal posture: Secondary | ICD-10-CM | POA: Diagnosis present

## 2023-11-08 NOTE — Therapy (Signed)
 OUTPATIENT PHYSICAL THERAPY SHOULDER EVALUATION   Patient Name: Megan Singleton MRN: [id number] DOB:10/23/1950, 73 y.o., female Today's Date: 11/08/2023  END OF SESSION:  PT End of Session - 11/08/23 1517     Visit Number 1    Number of Visits 13    Date for PT Re-Evaluation 12/22/23    Authorization Type [employer] MCR    Authorization Time Period 11/08/23 to 12/22/23    Progress Note Due on Visit 10    PT Start Time 1415    PT Stop Time 1457    PT Time Calculation (min) 42 min    Activity Tolerance Patient tolerated treatment well    Behavior During Therapy WFL for tasks assessed/performed             Past Medical History:  Diagnosis Date   Alcohol abuse    Allergy    Depression    Fibromyalgia    GERD (gastroesophageal reflux disease)    Hashimoto's thyroiditis    Hyperlipidemia    IBS (irritable bowel syndrome)    Osteopenia    Prolonged QT interval    RLS (restless legs syndrome)    Seizure disorder (HCC)    she is on life long medication per Neuro and can not have driving privileges if she  stops meds   Seizures (HCC)    Thyroid  disease    Past Surgical History:  Procedure Laterality Date   BUNIONECTOMY     Right and Hammertoe   CESAREAN SECTION  7829,5621   x's 2   ESOPHAGEAL MANOMETRY N/A 12/09/2016   Procedure: ESOPHAGEAL MANOMETRY (EM);  Surgeon: Jolinda Necessary, MD;  Location: [facility];  Service: Endoscopy;  Laterality: N/A;   ESOPHAGOGASTRODUODENOSCOPY (EGD) WITH PROPOFOL  N/A 05/23/2020   Procedure: ESOPHAGOGASTRODUODENOSCOPY (EGD) WITH PROPOFOL  with Botox ;  Surgeon: Felecia Hopper, MD;  Location: [facility];  Service: Gastroenterology;  Laterality: N/A;   FOOT SURGERY     Left--Neuroma, Bunion   LIPOSUCTION     abdominal   SUBMUCOSAL INJECTION  05/23/2020   Procedure: SUBMUCOSAL INJECTION;  Surgeon: Felecia Hopper, MD;  Location: [facility];  Service: Gastroenterology;;   TONSILLECTOMY     as a child   WRIST SURGERY     Right  wrist---Benign mass   Patient Active Problem List   Diagnosis Date Noted   Panic attacks 11/19/2014   Pap smear for cervical cancer screening 05/21/2014   Cough 09/27/2013   Thoracic back pain 08/18/2011   Conjunctivitis 07/16/2011   General medical examination 01/06/2011   DYSPAREUNIA 02/10/2010   VAGINITIS, ATROPHIC 02/10/2010   Pilonidal cyst 02/10/2010   PNEUMONIA 01/16/2010   INCONTINENCE, MIXED, URGE/STRESS 01/16/2010   CHEST PAIN UNSPECIFIED 10/04/2009   MALAISE AND FATIGUE 09/10/2009   DIARRHEA, CHRONIC 09/10/2009   Subjective visual disturbance 06/10/2009   GANGLION CYST, WRIST, LEFT 06/10/2009   Asymptomatic postmenopausal status 06/10/2009   PLANTAR FASCIITIS 12/13/2008   RESTLESS LEGS SYNDROME 09/05/2008   Impacted cerumen 07/23/2008   DEPRESSION 07/04/2008   Infective otitis externa 07/04/2008   Acute upper respiratory infection 07/04/2008   SWEATING 08/11/2007   Hyperlipidemia 08/02/2007   Alcohol abuse 08/02/2007   SINUSITIS- ACUTE-NOS 05/11/2007   Allergic rhinitis 05/11/2007   PAIN IN JOINT, HAND 03/09/2007   Hypothyroidism 12/15/2006   Hemorrhoids 12/15/2006   GERD 12/15/2006   Hematuria 12/15/2006   Disorder of bone and cartilage 12/15/2006   Seizure disorder (HCC) 12/15/2006    PCP: Jacqulyne Maxim MD   REFERRING PROVIDER: Shermon Divine, MD  REFERRING DIAG: Diagnosis M25.811 (ICD-10-CM) - Impingement of right shoulder  THERAPY DIAG:  Chronic right shoulder pain  Stiffness of right shoulder, not elsewhere classified  Muscle weakness (generalized)  Abnormal posture  Repeated falls  Rationale for Evaluation and Treatment: Rehabilitation  ONSET DATE: about 4 months ago   SUBJECTIVE:                                                                                                                                                                                      SUBJECTIVE STATEMENT:  Don't remember hurting the shoulder at all,  it started out of nowhere. Its been getting a little worse, I have trouble reaching overhead and twisting around and things like that. Had cortisone shot in the right shoulder but it didn't help.   Hand dominance: Right  PERTINENT HISTORY: See above   PAIN:  Are you having pain? Yes: NPRS scale: 5/10, at worst can get to 10/10 Pain location: right shoulder  Pain description: sharp  Aggravating factors: reaching overhead or for items  Relieving factors: not moving arm   PRECAUTIONS: None  RED FLAGS: None   WEIGHT BEARING RESTRICTIONS: No  FALLS:  Has patient fallen in last 6 months? Yes. Number of falls multiple falls on steps and into flower bed recently- "I don't pick my feet up enough, I'm old. 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    PATIENT SURVEYS:  Quick Dash 47.7%  COGNITION: Overall cognitive status:  reports STM impairment      SENSATION: Reports some tingling along shoulder blade, also intermittent tingling in hand but these started before shoulder pain   POSTURE: Rounded shoulders, forward head, R GH head seems extra internally rotated and anteriorly translated compared to L   UPPER EXTREMITY ROM:   Active ROM Right eval Left eval  Shoulder flexion 110* 130*  Shoulder extension    Shoulder abduction 97* 140*  Shoulder adduction    Shoulder internal rotation L1 L1  Shoulder external rotation C7 C7  Elbow flexion     Elbow extension    Wrist flexion  Wrist extension    Wrist ulnar deviation    Wrist radial deviation    Wrist pronation    Wrist supination    (Blank rows = not tested)  UPPER EXTREMITY MMT:  MMT Right eval Left eval  Shoulder flexion 3 pain 4  Shoulder extension    Shoulder abduction 3 pain  4-  Shoulder adduction    Shoulder internal rotation 4 pain 4+  Shoulder external rotation 4 4+  Middle trapezius    Lower trapezius    Elbow flexion 4 pain 4  Elbow extension 3+ pain 4  Wrist flexion    Wrist extension    Wrist ulnar deviation    Wrist radial deviation    Wrist pronation    Wrist supination    Grip strength (lbs)    (Blank rows = not tested)    PALPATION:  Very tender anterior R shoulder   FUNCTIONAL TESTS    11/08/23 0001  Standardized Balance Assessment  Standardized Balance Assessment Dynamic Gait Index  Dynamic Gait Index  Level Surface 3  Change in Gait Speed 3  Gait with Horizontal Head Turns 2  Gait with Vertical Head Turns 1  Gait and Pivot Turn 2  Step Over Obstacle 3  Step Around Obstacles 3  Steps 2  Total Score 19                                                                                                                               TREATMENT DATE:   11/08/23  Eval, POC, HEP   HEP practice and education as below      PATIENT EDUCATION: Education details: exam findings, POC, HEP, encouraged her to speak to MDs about feeling "stoned/snowed" from medications and falling due to being so out of it- or stopping by the pharmacist to see if they can recommend changes to medication doses or schedule to cut down on "stoned" feeling and falls  Person educated: Patient Education method: Explanation, Demonstration, and Handouts Education comprehension: verbalized understanding, returned demonstration, and needs further education  HOME EXERCISE PROGRAM: Access Code: [email or web address] URL: [email or web address] Date:  11/08/2023 Prepared by: Terrel Ferries  Exercises - Supine Shoulder Flexion with Dowel  - 1 x daily - 7 x weekly - 1-2 sets - 10 reps - Seated Scapular Retraction  - 1 x daily - 7 x weekly - 1-2 sets - 10 reps - Seated Backward Shoulder Rolls  - 1 x daily - 7 x weekly - 1-2 sets - 10 reps  ASSESSMENT:  CLINICAL IMPRESSION: Patient is a 73 y.o. F who was seen today for physical therapy evaluation and treatment for Diagnosis M25.811 (ICD-10-CM) - Impingement of right shoulder. Of note, she has had multiple falls potentially due to polypharmacy/taking multiple medications in the morning that she reports make her feed "stoned". Encouraged her to speak with MDs about dosing and timing recommendations. Shoulder limitations are quite severe due to pain. She does endorse that  she has been hesitant to start PT in the past and has refused it multiple times, does not enjoy exercise. Will make every effort to improve pain, functional, and fall risk.   OBJECTIVE IMPAIRMENTS: decreased activity tolerance, decreased balance, decreased ROM, decreased strength, impaired UE functional use, improper body mechanics, postural dysfunction, and pain.   ACTIVITY LIMITATIONS: carrying, lifting, sleeping, stairs, reach over head, hygiene/grooming, and locomotion level  PARTICIPATION LIMITATIONS: meal prep, cleaning, laundry, driving, shopping, community activity, and yard work  PERSONAL FACTORS: Age, Behavior pattern, Education, Fitness, Past/current experiences, Social background, and Time since onset of injury/illness/exacerbation are also affecting patient's functional outcome.   REHAB POTENTIAL: Fair limited buy into PT, polypharmacy potentially affecting some sx   CLINICAL DECISION MAKING: Evolving/moderate complexity  EVALUATION COMPLEXITY: Moderate   GOALS: Goals reviewed with patient? No  SHORT TERM GOALS: Target date: 11/29/2023    Will be compliant with appropriate progressive HEP  Baseline: Goal  status: INITIAL  2.  R shoulder AROM to be equal to that of L without increase in pain  Baseline:  Goal status: INITIAL  3.  Will demonstrate improved awareness of posture with all functional tasks  Baseline:  Goal status: INITIAL  4.  Will be able to name 3 ways to reduce fall risk at home and in the community  Baseline:  Goal status: INITIAL    LONG TERM GOALS: Target date: 12/20/2023    MMT to improve by 1 grade all weak groups  Baseline:  Goal status: INITIAL  2.  Pain R shoulder to be no more than 5/10 at worst  Baseline:  Goal status: INITIAL  3.  Will score at least 22 on DGI to show reduced fall risk  Baseline:  Goal status: INITIAL  4.  Will be able to sleep and perform all functional tasks around the home without increase from resting pain levels  Baseline:  Goal status: INITIAL  5.  QuickDASH score to have improved by at least 10 points to show subjective improvement  Baseline:  Goal status: INITIAL    PLAN:  PT FREQUENCY: 2x/week  PT DURATION: 6 weeks  PLANNED INTERVENTIONS: 97750- Physical Performance Testing, 97110-Therapeutic exercises, 97530- Therapeutic activity, 97112- Neuromuscular re-education, 97535- Self Care, 29562- Manual therapy, and 97116- Gait training  PLAN FOR NEXT SESSION: go slow and keep it simple for her- shoulder ROM and strength as tolerated, postural training, balance work   Terrel Ferries, PT, DPT 11/08/23 3:20 PM

## 2023-11-08 NOTE — Progress Notes (Signed)
   11/08/23 0001  Standardized Balance Assessment  Standardized Balance Assessment Dynamic Gait Index  Dynamic Gait Index  Level Surface 3  Change in Gait Speed 3  Gait with Horizontal Head Turns 2  Gait with Vertical Head Turns 1  Gait and Pivot Turn 2  Step Over Obstacle 3  Step Around Obstacles 3  Steps 2  Total Score 19

## 2023-11-10 ENCOUNTER — Ambulatory Visit: Admitting: Physical Therapy

## 2023-11-11 ENCOUNTER — Ambulatory Visit: Admitting: Physical Therapy

## 2023-11-16 ENCOUNTER — Other Ambulatory Visit: Payer: Self-pay

## 2023-12-08 ENCOUNTER — Other Ambulatory Visit: Payer: Self-pay | Admitting: Orthopaedic Surgery

## 2023-12-08 DIAGNOSIS — G8929 Other chronic pain: Secondary | ICD-10-CM

## 2024-01-01 ENCOUNTER — Other Ambulatory Visit: Payer: Self-pay

## 2024-01-01 ENCOUNTER — Encounter (HOSPITAL_BASED_OUTPATIENT_CLINIC_OR_DEPARTMENT_OTHER): Payer: Self-pay | Admitting: Emergency Medicine

## 2024-01-01 ENCOUNTER — Emergency Department (HOSPITAL_BASED_OUTPATIENT_CLINIC_OR_DEPARTMENT_OTHER)

## 2024-01-01 ENCOUNTER — Emergency Department (HOSPITAL_BASED_OUTPATIENT_CLINIC_OR_DEPARTMENT_OTHER)
Admission: EM | Admit: 2024-01-01 | Discharge: 2024-01-01 | Disposition: A | Discharge status: Home / Self Care | Attending: Emergency Medicine | Admitting: Emergency Medicine

## 2024-01-01 DIAGNOSIS — R079 Chest pain, unspecified: Secondary | ICD-10-CM | POA: Diagnosis present

## 2024-01-01 DIAGNOSIS — F419 Anxiety disorder, unspecified: Secondary | ICD-10-CM | POA: Diagnosis not present

## 2024-01-01 HISTORY — DX: Anxiety disorder, unspecified: F41.9

## 2024-01-01 LAB — COMPREHENSIVE METABOLIC PANEL WITH GFR
ALT: 16 U/L (ref 0–44)
AST: 20 U/L (ref 15–41)
Albumin: 4.8 g/dL (ref 3.5–5.0)
Alkaline Phosphatase: 94 U/L (ref 38–126)
Anion gap: 17 — ABNORMAL HIGH (ref 5–15)
BUN: 13 mg/dL (ref 8–23)
CO2: 20 mmol/L — ABNORMAL LOW (ref 22–32)
Calcium: 9.4 mg/dL (ref 8.9–10.3)
Chloride: 102 mmol/L (ref 98–111)
Creatinine, Ser: 0.67 mg/dL (ref 0.44–1.00)
GFR, Estimated: >60 mL/min (ref >60)
Glucose, Bld: 125 mg/dL — ABNORMAL HIGH (ref 70–99)
Potassium: 3.4 mmol/L — ABNORMAL LOW (ref 3.5–5.1)
Sodium: 140 mmol/L (ref 135–145)
Total Bilirubin: 0.5 mg/dL (ref 0.0–1.2)
Total Protein: 6.7 g/dL (ref 6.5–8.1)

## 2024-01-01 LAB — CBC WITH DIFFERENTIAL/PLATELET
Abs Immature Granulocytes: 0.02 10*3/uL (ref 0.00–0.07)
Basophils Absolute: 0.1 10*3/uL (ref 0.0–0.1)
Basophils Relative: 1 %
Eosinophils Absolute: 0 10*3/uL (ref 0.0–0.5)
Eosinophils Relative: 0 %
HCT: 40.8 % (ref 36.0–46.0)
Hemoglobin: 14.1 g/dL (ref 12.0–15.0)
Immature Granulocytes: 0 %
Lymphocytes Relative: 36 %
Lymphs Abs: 3.7 10*3/uL (ref 0.7–4.0)
MCH: 30.6 pg (ref 26.0–34.0)
MCHC: 34.6 g/dL (ref 30.0–36.0)
MCV: 88.5 fL (ref 80.0–100.0)
Monocytes Absolute: 0.5 10*3/uL (ref 0.1–1.0)
Monocytes Relative: 5 %
Neutro Abs: 6 10*3/uL (ref 1.7–7.7)
Neutrophils Relative %: 58 %
Platelets: 245 10*3/uL (ref 150–400)
RBC: 4.61 MIL/uL (ref 3.87–5.11)
RDW: 12.8 % (ref 11.5–15.5)
WBC: 10.3 10*3/uL (ref 4.0–10.5)
nRBC: 0 % (ref 0.0–0.2)

## 2024-01-01 LAB — LIPASE, BLOOD: Lipase: 30 U/L (ref 11–51)

## 2024-01-01 LAB — TROPONIN T, HIGH SENSITIVITY: Troponin T High Sensitivity: <15 ng/L (ref <19)

## 2024-01-01 MED ORDER — MIDAZOLAM HCL 2 MG/2ML IJ SOLN
1.0000 mg | Freq: Once | INTRAMUSCULAR | Status: AC
  Administered 2024-01-01: 1 mg via INTRAVENOUS
  Filled 2024-01-01: qty 2

## 2024-01-01 NOTE — Discharge Instructions (Signed)
 Your test here look okay.  Please return for sudden worsening symptoms especially upon exertion or if you cough up blood or pass out.

## 2024-01-01 NOTE — ED Notes (Signed)
 Placed on 2L after versed  administration

## 2024-01-01 NOTE — ED Provider Notes (Signed)
 [place] [facility] [place] Provider Note   CSN: [id number] Arrival date & time: 01/01/24  4132     Patient presents with: Anxiety   Megan Singleton is a 73 y.o. female.   73 yo F with a chief complaints of feeling anxious.  This started yesterday.  Patient had been found after that has been a bit at work shaking all over and very worried.  Complaining of chest pain off and on.  Patient tells me she does not remember what is been going on.  Denies any chest pain now.  Husband provides most of the history.  Denies coughing congestion.   Anxiety       Prior to Admission medications   Medication Sig Start Date End Date Taking? Authorizing Provider  busPIRone (BUSPAR) 10 MG tablet Take 10 mg by mouth 2 (two) times daily.   Yes [provider]  gabapentin (NEURONTIN) 600 MG tablet Take 1,200 mg by mouth at bedtime.   Yes [provider]  hydrOXYzine  (ATARAX /VISTARIL ) 25 MG tablet Take 1 tablet (25 mg total) by mouth every 8 (eight) hours as needed for anxiety. 03/22/21  Yes Charmayne Cooper, MD  hydrOXYzine  (VISTARIL ) 25 MG capsule Take 1 capsule by mouth. 01/02/23  Yes [provider]  levothyroxine  (SYNTHROID ) 125 MCG tablet Take 125 mcg by mouth daily before breakfast.   Yes [provider]  MYRBETRIQ 50 MG TB24 tablet Take 50 mg by mouth daily.   Yes [provider]  pramipexole  (MIRAPEX ) 1.5 MG tablet Take by mouth.   Yes [provider]  sertraline (ZOLOFT) 100 MG tablet Take 100 mg by mouth daily. 12/30/23  Yes [provider]  benzonatate  (TESSALON ) 100 MG capsule Take 1 capsule (100 mg total) by mouth every 8 (eight) hours. Patient not taking: Reported on 06/02/2022 03/22/21   Charmayne Cooper, MD  Cyanocobalamin (B-12 PO) Take by mouth.    [provider]  diltiazem  (CARDIZEM  CD) 120 MG 24 hr capsule Take 1 capsule (120 mg total) by mouth daily. 10/08/23   Nahser, Lela Purple,  MD  levETIRAcetam (KEPPRA) 100 MG/ML solution Take 10 mLs by mouth in the morning and at bedtime. 05/10/20   [provider]  methylPREDNISolone  (MEDROL  DOSEPAK) 4 MG TBPK tablet Per dose pack instructions starting 08/01/2022 07/31/22   Wynetta Heckle, MD  oxyCODONE -acetaminophen  (PERCOCET/ROXICET) 5-325 MG tablet Take 1-2 tablets by mouth every 6 (six) hours as needed for severe pain. 07/31/22   Wynetta Heckle, MD  pantoprazole  (PROTONIX ) 40 MG tablet TAKE 1 TABLET BY MOUTH EVERY DAY Patient not taking: Reported on 06/02/2022 10/05/15   Tabori, Katherine E, MD  pramipexole  (MIRAPEX ) 1 MG tablet 3 tabs Orally at bedtime    [provider]  Probiotic Product (ALIGN PO) Take by mouth.    [provider]  rosuvastatin  (CRESTOR ) 10 MG tablet Take 1 tablet (10 mg total) by mouth daily. 09/10/23   Marlyse Single T, PA-C  sucralfate  (CARAFATE ) 1 g tablet Take 1 tablet (1 g total) by mouth 4 (four) times daily -  with meals and at bedtime. Patient not taking: Reported on 06/02/2022 05/01/22   Earma Gloss, MD    Allergies: Morphine and Haloperidol  and related    Review of Systems  Updated Vital Signs BP (!) 146/79   Pulse 97   Temp 98.3 F (36.8 C) (Oral)   Resp (!) 21   Ht 5' 4 (1.626 m)   Wt 68 kg  SpO2 94%   BMI 25.75 kg/m   Physical Exam Vitals and nursing note reviewed.  Constitutional:      General: She is not in acute distress.    Appearance: She is well-developed. She is not diaphoretic.  HENT:     Head: Normocephalic and atraumatic.   Eyes:     Pupils: Pupils are equal, round, and reactive to light.    Cardiovascular:     Rate and Rhythm: Normal rate and regular rhythm.     Heart sounds: No murmur heard.    No friction rub. No gallop.  Pulmonary:     Effort: Pulmonary effort is normal.     Breath sounds: No wheezing or rales.  Abdominal:     General: There is no distension.     Palpations: Abdomen is soft.     Tenderness: There is no  abdominal tenderness.   Musculoskeletal:        General: No tenderness.     Cervical back: Normal range of motion and neck supple.     Comments: reflexes are 1+ in all extremities.  No clonus. No rigidity.   Skin:    General: Skin is warm and dry.   Neurological:     Mental Status: She is alert and oriented to person, place, and time.   Psychiatric:        Behavior: Behavior normal.     (all labs ordered are listed, but only abnormal results are displayed) Labs Reviewed  COMPREHENSIVE METABOLIC PANEL WITH GFR - Abnormal; Notable for the following components:      Result Value   Potassium 3.4 (*)    CO2 20 (*)    Glucose, Bld 125 (*)    Anion gap 17 (*)    All other components within normal limits  CBC WITH DIFFERENTIAL/PLATELET  LIPASE, BLOOD  TROPONIN T, HIGH SENSITIVITY    EKG: EKG Interpretation Date/Time:  Saturday January 01 2024 18:00:52 EDT Ventricular Rate:  115 PR Interval:  151 QRS Duration:  100 QT Interval:  354 QTC Calculation: 490 R Axis:   63  Text Interpretation: Sinus tachycardia Low voltage with right axis deviation Minimal ST depression, lateral leads Borderline prolonged QT interval No significant change since last tracing Confirmed by Albertus Hughs [phone]) on 01/01/2024 6:06:39 PM  Radiology: [facility] Query Chest Port 1 View Result Date: 01/01/2024 CLINICAL DATA:  chest pain Anxiety and shaking since yesterday. Ex smoker. EXAM: PORTABLE CHEST 1 VIEW COMPARISON:  Chest x-ray 08/02/2023 FINDINGS: Patient is rotated. The heart and mediastinal contours are unchanged. Atherosclerotic plaque. Left base atelectasis. No focal consolidation. No pulmonary edema. No pleural effusion. No pneumothorax. No acute osseous abnormality. IMPRESSION: 1. No active disease. 2.  Aortic Atherosclerosis (ICD10-I70.0). Electronically Signed   By: Morgane  Naveau M.D.   On: 01/01/2024 19:20     Procedures   Medications Ordered in the ED  midazolam  (VERSED ) injection 1 mg (1 mg  Intravenous Given 01/01/24 1838)                                    Medical Decision Making Amount and/or Complexity of Data Reviewed Labs: ordered. Radiology: ordered.  Risk Prescription drug management.   73 yo F with a chief complaints of feeling very anxious.  The husband states that this happens to her from time to time.  She has had to come to the ER for this before.  Going on for  about 24 hours not really eating and not sleeping.    Will give her medications here.  Screening for possible acute laboratory abnormality or MI.  No anemia, no leukocytosis, no significant electrolyte abnormalities.  Chest x-ray independently interpreted by me without focal infiltrate or pneumothorax.  Troponin is negative.  With symptoms going on greater than 6 hours without significant change do not feel delta is warranted.  I discussed results with the patient and family.  She is feeling much better after an IV dose of Versed .  Will discharge home.  PCP follow-up.  7:55 PM:  I have discussed the diagnosis/risks/treatment options with the patient and family.  Evaluation and diagnostic testing in the emergency department does not suggest an emergent condition requiring admission or immediate intervention beyond what has been performed at this time.  They will follow up with PCP. We also discussed returning to the ED immediately if new or worsening sx occur. We discussed the sx which are most concerning (e.g., sudden worsening pain, fever, inability to tolerate by mouth) that necessitate immediate return. Medications administered to the patient during their visit and any new prescriptions provided to the patient are listed below.  Medications given during this visit Medications  midazolam  (VERSED ) injection 1 mg (1 mg Intravenous Given 01/01/24 1838)     The patient appears reasonably screen and/or stabilized for discharge and I doubt any other medical condition or other [facility] requiring further screening,  evaluation, or treatment in the ED at this time prior to discharge.       Final diagnoses:  Nonspecific chest pain    ED Discharge Orders     None          Albertus Hughs, DO 01/01/24 1955

## 2024-01-01 NOTE — ED Triage Notes (Signed)
 Pt with anxiety and shaking since yesterday; husband sts it is worse now; Sertraline was increased yesterday, but these sxs were present before she took the increased dose; similar episodes in the past

## 2024-02-22 NOTE — Progress Notes (Signed)
 Orthopedic Surgery Office Note  Chief Complaint  Patient presents with  . New Patient    Pt is here for left knee/hip and right shoulder that has been an ongoing issue.  She does not recall a trigger this time around, but she does work in the yard often.    Left Leg - She does report that sometimes the pain does shoot down to the ankle area.    Right Shoulder - She does have OA and rotator cuff issues.      History of Present Illness: Megan Singleton is a very pleasant 73 year old female who comes in today accompanied by her husband with a chief complaint of left severe knee pain as well as left lateral hip pain for the past several weeks.  She states that the knee and hip pain have been going on for almost a year however it has been off and on and not as severe.  She states that over the past couple of weeks pain if been getting progressively worse.  She did have a cortisone injection to the left knee roughly 6 weeks ago.  She states that the pain did subside but unfortunately has returned.  She points to the anterior medial anterior lateral portions of the left knee as the areas of greatest amount of pain.  Pain occurs mostly with weightbearing activities such as standing and walking.  Patient is also experiencing pain to the lateral aspect of the left hip and points to the trochanteric bursa of the left lateral hip as the area of greatest amount of pain.  Similarly patient is having complaints of pain with the left hip with weightbearing.  Pressure on the left hip also causes pain such as rolling over in bed.  She has not had any recent injuries with the left knee or hip and denies nausea vomiting fever chills or paresthesias bilaterally.   History and Physical standard intake form from today's or previous visit is reviewed, pertinent information is noted and otherwise scanned into the patient's permanent medical record for future use.    Review of Systems:  Negative unless otherwise specified in  HPI   Physical Exam:   General:  Alert and oriented x 3, NAD, well kempt, and of stated age.   Vital Signs:  There were no vitals taken for this visit. , There is no height or weight on file to calculate BMI.   HEENT:  Head is normocephalic and atraumatic. Extraocular muscles are grossly intact. Pupils are equal, round, and reactive to light and accommodation. Nares appeared normal. Mucous membranes are moist.  Neck:  Supple, no visible swelling.  Cardiovascular:  Hemodynamically stable.  Respiratory:  No audible wheezing, unlabored respirations.  Neurological:  Cranial nerves II through XII appear grossly intact.   Skin:  Clean and dry, well kempt.  Psychiatric:  Good affect, stable, cooperative  Extremities:  Pink without cyanosis, bilaterally.  On physical exam left knee is well aligned with no varus or valgus deformity.  Tenderness to palpation noted to the anterior medial joint line of the left knee.  Small effusion observed without erythema or ecchymosis noted.  Range of motion is 5 degrees of extension lag and 90 of flexion.  Strength is limited with extension and flexion due to pain.  Dorsalis pedis pulses are 2+ bilaterally, distal toes are well perfused capillary refill less than 2 seconds.  Normal sensation is noted.  Skin is healthy and intact.  On physical exam the left hip is well aligned without  gross deformity.  There is positive tenderness to palpation to the left lateral hip at the area of the greater trochanter.  There is no tenderness to palpation to the anterior left hip.  Strength is 5 of 5 with flexion and extension of the right hip compared to the left as well as quadriceps flexion and extension equal bilaterally.  Deep tendon reflexes at the patellar tendons are equal bilaterally normal.  Logrolling of the left hip produces no pain.  Range of motion of the left hip is equal bilaterally.  Internal and external rotation of the left hip does not reproduce pain in the  right groin.  Skin healthy and intact no edema or ecchymosis noted on exam.     Assessment:    Osteoarthritis left knee, trochanteric bursitis left   Plan:   I had a detailed discussion with patient regarding continued treatment of the arthritis and bursitis of the left lower extremity.  Patient will continue conservative management including rest ice elevation and NSAIDs as needed.  Based on subjective history as well as physical exam further recommendation is made for x-rays of the bilateral knees as well as the bilateral hips as well as Medrol  Dosepak for pain relief and follow-up with Dr. Rubie for further evaluation and surgical consultation.  Patient agrees with my assessment and plan, all questions and concerns were addressed today during exam, she will follow-up as scheduled.   Test Results    Labs  Lab Results  Component Value Date   WBC 6.60 02/21/2024   HGB 13.2 02/21/2024   HCT 40.0 02/21/2024   PLT 250 02/21/2024    Lab Results  Component Value Date   NA 142 10/12/2023   K 4.3 10/12/2023   CL 107 10/12/2023   CO2 28 10/12/2023   BUN 21 10/12/2023   CREATININE 0.74 10/12/2023   GLUCOSE 90 10/12/2023   CALCIUM  9.1 10/12/2023   MG 2.2 09/14/2019    Lab Results  Component Value Date   BILITOT 0.4 10/12/2023   PROT 6.1 (L) 10/12/2023   ALBUMIN 4.6 10/12/2023   ALT 14 10/12/2023   AST 18 10/12/2023   ALP 85 10/12/2023    No results found for: LABPROT, INR, PTT  Allergies  Haloperidol  and Morphine   Medications    Current Medications[1]   Past Medical History  Past medical history reviewed and otherwise negative unless stated below.  Medical History[2]   Past Surgical History  Past surgical history reviewed and otherwise negative unless stated below.  Surgical History[3]   Family History  Family history reviewed and otherwise negative unless stated below.  Family History[4]   Social History:  Social history reviewed and  otherwise negative unless stated below.  Social History   Socioeconomic History  . Marital status: Married    Spouse name: Not on file  . Number of children: 3  . Years of education: 25  . Highest education level: Not on file  Occupational History  . Not on file  Tobacco Use  . Smoking status: Former    Current packs/day: 0.00    Average packs/day: 3.0 packs/day for 10.0 years (30.0 ttl pk-yrs)    Types: Cigarettes    Start date: 07/20/1976    Quit date: 07/20/1986    Years since quitting: 37.6    Passive exposure: Current  . Smokeless tobacco: Never  Vaping Use  . Vaping status: Never Used  Substance and Sexual Activity  . Alcohol use: Yes    Alcohol/week: 5.0 -  7.0 standard drinks of alcohol    Comment: one drink a day  . Drug use: No  . Sexual activity: Yes    Partners: Male  Other Topics Concern  . Not on file  Social History Narrative  . Not on file   Social Drivers of Health   Food Insecurity: Low Risk  (10/05/2023)   Food vital sign   . Within the past 12 months, you worried that your food would run out before you got money to buy more: Never true   . Within the past 12 months, the food you bought just didn't last and you didn't have money to get more: Never true  Transportation Needs: No Transportation Needs (10/05/2023)   Transportation   . In the past 12 months, has lack of reliable transportation kept you from medical appointments, meetings, work or from getting things needed for daily living? : No  Safety: Low Risk  (02/21/2024)   Safety   . How often does anyone, including family and friends, physically hurt you?: Never   . How often does anyone, including family and friends, insult or talk down to you?: Never   . How often does anyone, including family and friends, threaten you with harm?: Never   . How often does anyone, including family and friends, scream or curse at you?: Never  Living Situation: Low Risk  (10/05/2023)   Living Situation   . What is your  living situation today?: I have a steady place to live   . Think about the place you live. Do you have problems with any of the following? Choose all that apply:: None/None on this list         Problem List  Problem List[5]         [1] Current Outpatient Medications  Medication Sig Dispense Refill  . albuterol HFA (PROVENTIL HFA;VENTOLIN HFA;PROAIR HFA) 90 mcg/actuation inhaler INHALE 2 PUFFS BY MOUTH EVERY 4 TO 6 HOURS FOR COUGH    . busPIRone (BUSPAR) 10 mg tablet     . calcium  carbonate-vitamin D3 1,000 mg-20 mcg (800 unit) tab 1 tablet.    . celecoxib (CeleBREX) 200 mg capsule Take 1 capsule (200 mg total) by mouth 2 (two) times a day as needed for mild pain (1-3). (Patient not taking: Reported on 02/21/2024) 30 capsule 0  . cyanocobalamin, vitamin B-12, 1,000 mcg ER tablet Take  by mouth.    . diclofenac sodium (VOLTAREN) 1 % gel     . dilTIAZem  (CARDIZEM  CD) 120 mg 24 hr capsule  (Patient not taking: Reported on 02/21/2024)    . gabapentin (NEURONTIN) 600 mg tablet Take 1 tablet (600 mg total) by mouth 3 (three) times a day. 270 tablet 0  . hydrOXYzine  pamoate (VISTARIL ) 25 mg capsule Take 1 capsule by mouth 4 (four) times a day before meals and nightly. (Patient not taking: Reported on 02/21/2024)    . ibuprofen (MOTRIN) 600 mg tablet TAKE 1 TABLET BY MOUTH FOUR TIMES DAILY FOR 5 DAYS AS NEEDED FOR PAIN    . levETIRAcetam (KEPPRA) 750 mg tablet Take 1 tablet (750 mg total) by mouth 2 (two) times a day Indications: additional medication to treat partial seizures. 180 tablet 1  . levothyroxine  (SYNTHROID ) 125 mcg tablet TAKE 1 TABLET BY MOUTH EVERY MORNING (DOSE CHANGE) 28 tablet 0  . mirabegron (Myrbetriq) 50 mg Tb24 TAKE 1 TABLET BY MOUTH DAILY 28 tablet 0  . pramipexole  (MIRAPEX ) 1.5 mg tablet Take 2 tablets (3 mg total) by  mouth 2 (two) times a day. 360 tablet 0  . rosuvastatin  (CRESTOR ) 10 mg tablet TAKE 1 TABLET BY MOUTH DAILY (Patient not taking: Reported on 02/21/2024) 30  tablet 1  . sertraline (ZOLOFT) 100 mg tablet Take 100 mg by mouth daily.    SABRA thiamine (VITAMIN B1) 100 mg tablet Take 1 tablet (100 mg total) by mouth daily. 90 tablet 3  . Tyrvaya 0.03 mg/spray sprm  (Patient not taking: Reported on 02/21/2024)     No current facility-administered medications for this visit.  [2] Past Medical History: Diagnosis Date  . Acquired hypothyroidism   . Alcohol abuse   . Bipolar disorder with moderate depression    (CMD)   . Complex partial seizure disorder    (CMD)   . Esophageal stricture   . Gastroesophageal reflux disease with esophagitis without hemorrhage   . Generalized anxiety disorder   . Irritable bowel syndrome with diarrhea   . Memory deficits   . Mixed hyperlipidemia   . Nephrolithiasis   . Panic disorder without agoraphobia   . Pilonidal cyst   . Primary fibromyalgia syndrome   . Primary generalized (osteo)arthritis   . Recurrent major depressive disorder   . Restless legs syndrome (RLS)   . Suprascapular nerve entrapment   . Vitamin B12 deficiency   [3] Past Surgical History: Procedure Laterality Date  . BUNIONECTOMY Bilateral    Procedure: BUNIONECTOMY  . CATARACT EXTRACTION     Procedure: CATARACT EXTRACTION  . CATARACT EXTRACTION Left 08/14/2020   Procedure: YAG CAPSULOTOMY  . CATARACT EXTRACTION Right 08/21/2020   Procedure: YAG CAPSULOTOMY  . CATARACT EXTRACTION W/  INTRAOCULAR LENS IMPLANT Right 04/19/2019   Procedure: CATARACT EXTRACTION W/ INTRAOCULAR LENS IMPLANT; SN60WF 25.0  . CATARACT EXTRACTION W/  INTRAOCULAR LENS IMPLANT Left 04/26/2019   Procedure: CATARACT EXTRACTION W/ INTRAOCULAR LENS IMPLANT; SN60WF 24.5  . CESAREAN SECTION, UNSPECIFIED     Procedure: CESAREAN SECTION  . CORRECTION HAMMER TOE     Procedure: CORRECTION HAMMER TOE  . FOOT NEUROMA SURGERY Left    Procedure: FOOT NEUROMA SURGERY  . GANGLION CYST EXCISION     Procedure: GANGLION CYST EXCISION; Right Wrist  . LIPOSUCTION TRUNK     Procedure:  LIPOSUCTION TRUNK; Abdomen  . TONSILLECTOMY     Procedure: TONSILLECTOMY  . UPPER ENDOSCOPY W/ ESOPHAGEAL MANOMETRY     Procedure: UPPER ENDOSCOPY W/ ESOPHAGEAL MANOMETRY  [4] Family History Problem Relation Name Age of Onset  . Cancer Mother    . Hypertension Mother    . Hyperlipidemia Mother    . Chorea Father    . Cancer Father    . Hypertension Father    . Hyperlipidemia Father    . Stroke Maternal Grandmother    . Diabetes Maternal Grandmother    . Hypertension Maternal Grandmother    . Stroke Maternal Grandfather    . Heart disease Maternal Grandfather    . Hyperlipidemia Maternal Grandfather    . Hypertension Maternal Grandfather    . Seizures Neg Hx    [5] Patient Active Problem List Diagnosis  . Alcohol abuse  . Complex partial seizure disorder (HCC)  . Primary fibromyalgia syndrome  . Mixed hyperlipidemia  . Acquired hypothyroidism  . Irritable bowel syndrome with diarrhea  . Memory loss  . Suprascapular nerve entrapment  . Restless legs syndrome (RLS)  . Gastroesophageal reflux disease with esophagitis and hemorrhage  . Panic disorder without agoraphobia  . Vitamin B12 deficiency  . Generalized anxiety disorder with panic attacks  .  Bipolar disorder with moderate depression (HCC)  . Esophageal stricture  . Primary generalized (osteo)arthritis  . SOB (shortness of breath)  . Other constipation  . Closed fracture of proximal phalanx of right little finger  . OSA (obstructive sleep apnea)  . OAB (overactive bladder)  . Right lumbar radiculopathy  . Tinnitus of both ears  . Sensorineural hearing loss (SNHL) of both ears

## 2024-02-24 ENCOUNTER — Other Ambulatory Visit: Payer: Self-pay

## 2024-02-24 ENCOUNTER — Emergency Department (HOSPITAL_BASED_OUTPATIENT_CLINIC_OR_DEPARTMENT_OTHER)
Admission: EM | Admit: 2024-02-24 | Discharge: 2024-02-24 | Disposition: A | Discharge status: Home / Self Care | Attending: Emergency Medicine | Admitting: Emergency Medicine

## 2024-02-24 DIAGNOSIS — M25562 Pain in left knee: Secondary | ICD-10-CM | POA: Insufficient documentation

## 2024-02-24 DIAGNOSIS — G8929 Other chronic pain: Secondary | ICD-10-CM | POA: Insufficient documentation

## 2024-02-24 DIAGNOSIS — M25551 Pain in right hip: Secondary | ICD-10-CM | POA: Diagnosis present

## 2024-02-24 DIAGNOSIS — Z5321 Procedure and treatment not carried out due to patient leaving prior to being seen by health care provider: Secondary | ICD-10-CM | POA: Insufficient documentation

## 2024-02-24 NOTE — ED Triage Notes (Signed)
 Pt reports chronic pain soreness in the right hip & knee that worsened today.  Currently on steroids for same.

## 2024-03-07 NOTE — Progress Notes (Signed)
    FYI   Ortho finally sent pt something for pain and she is having an MRI done on this Friday!

## 2024-03-23 NOTE — Progress Notes (Signed)
 [address] - AMBULATORY [facility]  - FAMILY MEDICINE [facility] [address] [place] [address]    Date of service:  03/23/2024  Name:  Megan Singleton  Date of Birth:  Apr 14, 1951    SUBJECTIVE   Patient ID: Megan Singleton is a 73 y.o. (DOB Oct 08, 1950) female  Chief Complaint  Patient presents with  . Pre-op Exam    She  will be having Left Hip Replacement on TBD by Dr. Edna.   No known history of CAD, no syncope/presyncope, chest pain/tightness/pressure, SHOB or exertional dyspnea.  The patient performs all  ADL's including vacuuming, laundry, grooming without SHOB, dizziness or chest pain & has never had a stress test.   Non smoker, no history of lung disease.  Patient has adequate/help and assistance at home for post op period. Patient has one alcoholic drink a day. Recent issue with GERD takes OTC Pepcid with good results. GERD is a chronic problem but essentially symptom-free, seems to have had recent exacerbation, Pepcid is helping  Chronic hypothyroidism stable, she is on levothyroxine  125 mcg daily History of seizures stable for years on Keppra History of EtOH abuse, alcohol use has decreased, down to 1 drink per day Bipolar disorder  PreCare Preop Checklist: History of Difficult IV access:  No Does the patient have a coronary stent:  No History of difficult airway or positive predictors by physical exam:  No Objection to blood transfusion:  No Is patient on a Blood thinner?:  No Pacemaker/AICD/bladder/bowel/spinal cord stimulator/LVAD/BIPAP:  No Chronic pain: Yes, Left Hip Pain  Moderate risk surgery  Cardiac risk index High risk surgery:  No   History of coronary artery disease:  No Congestive heart failure:  No History of cerebrovascular disease:  No Insulin treatment for diabetes mellitus:  No Preop serum creatinine greater than 2:  No  0 points equals class I      (0.4% risk of complication)  Active Cardiac Condition: No, and denies cardiac signs or symptoms  METs equal to or more than 4: Yes,9.89 METS Has the patient had recent cardiac testing:  No Is a Cardiology consult or preoperative stress test necessary:  No  Review of Systems  All other pertinent systems reviewed and are negative.  The following portions of the patient's history were reviewed and updated as appropriate: allergies, current medications, past family history, past medical history, past social history, past surgical history and problem list.  OBJECTIVE   Vitals:   03/23/24 1035  BP: 136/80  BP Location: Left arm  Patient Position: Sitting  Pulse: 88  Temp: 97.5 F (36.4 C)  TempSrc: Temporal  SpO2: 96%  Weight: 68.5 kg (151 lb 2 oz)  Height: 1.6 m (5' 3)    Body mass index is 26.77 kg/m. Constitutional: Alert - NAD. Appears well-developed and well nourished. HEENT: Normocephalic, atraumatic.  EOMI, PERRL Neck: No cervical adenopathy. No thyromegaly.  Cardiovascular: Normal rate and rhythm.  Exam reveals no gallop and no friction rub.  No murmur heard.  Pulmonary/chest: Effort normal and breath sounds normal. No wheezes. No rales.  Skin: Warm and dry. No rashes noted. Patient is not diaphoretic. Extremities: No edema bilaterally. No clubbing/cyanosis. Neuro:  CN grossly intact Psychiatric: Normal mood and affect. Behavior normal. Judgment and thought content normal.   ASSESSMENT/PLAN   Problem List Items Addressed This Visit     Alcohol abuse   Better, she has been having 1 drink per day  Acquired hypothyroidism   TSH in June and within reference range, continue current dose of levothyroxine       Gastroesophageal reflux disease with esophagitis and hemorrhage   Stable, intermittent symptoms, continue famotidine as needed      Bipolar disorder with moderate depression (HCC)   Stable based upon symptoms and exam Continue current management per  behavioral health      Other Visit Diagnoses       Preop examination    -  Primary       I have seen and examined Megan Singleton today.   The purpose of the preoperative examination is to ensure all of the patient's medical problems are optimally managed prior to the surgery.   Chronic conditions are stable. I have examined this patient and certify that to the best of my knowledge, there is not a medical contraindication for undergoing elective surgery with a general and or regional anesthesia. Last office visit note was reviewed.  All issues under  Problem List Items Addressed This Visit  above were discussed and addressed during this visit. Risks, benefits, and alternatives of the medication(s) and treatment plan(s) were discussed, and she expressed understanding. Plan follow up as discussed or as needed. No barriers to treatment identified in this visit.   Return for Next Scheduled Follow Up.      Current Outpatient Medications  Medication Instructions  . acetaminophen  (TYLENOL  EXTRA STRENGTH ORAL) Take by mouth.  . busPIRone (BUSPAR) 10 mg tablet   . calcium  carbonate-vitamin D3 1,000 mg-20 mcg (800 unit) tab 1 tablet  . cyanocobalamin, vitamin B-12, 1,000 mcg ER tablet Take  by mouth.  . diclofenac sodium (VOLTAREN) 1 % gel   . gabapentin (NEURONTIN) 600 mg, oral, 3 times daily  . HYDROcodone-acetaminophen  (NORCO) 5-325 mg per tablet 1 tablet, Every 8 hours PRN  . hydrOXYzine  pamoate (VISTARIL ) 25 mg capsule 1 capsule, 4 times daily before meals and nightly  . levETIRAcetam (KEPPRA) 750 mg, oral, 2 times daily  . levothyroxine  (SYNTHROID ) 125 mcg tablet TAKE 1 TABLET BY MOUTH EVERY MORNING (DOSE CHANGE)  . Myrbetriq 50 mg, oral, Daily  . naproxen  (NAPROSYN ) 500 mg tablet 1 tablet, 2 times daily - Transplant  . pramipexole  (MIRAPEX ) 3 mg, oral, 2 times daily  . rosuvastatin  (CRESTOR ) 10 mg, oral, Daily  . sertraline (ZOLOFT) 100 mg, Daily  . thiamine (VITAMIN B1) 100 mg, oral,  Daily  . Tyrvaya 0.03 mg/spray sprm       This document serves as a record of services personally performed by Madelin Brought, MD.  It was created on their behalf by Rolin LOISE Ka, CMA, a trained medical scribe, and Certified Medical Assistant (CMA). During the course of documenting the history, physical exam and medical decision making, I was functioning as a Stage manager. The creation of this record is the provider's dictation and/or activities during the visit.  Electronically signed by Rolin LOISE Ka, CMA 03/23/2024 8:00 AM    I agree the documentation is accurate and complete.  Electronically signed by: Madelin Rachel Brought, MD 03/23/2024 1:22 PM

## 2024-04-20 NOTE — Therapy (Incomplete)
 OUTPATIENT PHYSICAL THERAPY LOWER EXTREMITY EVALUATION   Patient Name: Megan Singleton MRN: [id number] DOB:April 14, 1951, 73 y.o., female Today's Date: 04/20/2024  END OF SESSION:   Past Medical History:  Diagnosis Date   Alcohol abuse    Allergy    Anxiety    Depression    Fibromyalgia    GERD (gastroesophageal reflux disease)    Hashimoto's thyroiditis    Hyperlipidemia    IBS (irritable bowel syndrome)    Osteopenia    Prolonged QT interval    RLS (restless legs syndrome)    Seizure disorder (HCC)    she is on life long medication per Neuro and can not have driving privileges if she  stops meds   Seizures (HCC)    Thyroid  disease    Past Surgical History:  Procedure Laterality Date   BUNIONECTOMY     Right and Hammertoe   CESAREAN SECTION  8021,8009   x's 2   ESOPHAGEAL MANOMETRY N/A 12/09/2016   Procedure: ESOPHAGEAL MANOMETRY (EM);  Surgeon: Celestia Agent, MD;  Location: [facility];  Service: Endoscopy;  Laterality: N/A;   ESOPHAGOGASTRODUODENOSCOPY (EGD) WITH PROPOFOL  N/A 05/23/2020   Procedure: ESOPHAGOGASTRODUODENOSCOPY (EGD) WITH PROPOFOL  with Botox ;  Surgeon: Elicia Claw, MD;  Location: [facility];  Service: Gastroenterology;  Laterality: N/A;   FOOT SURGERY     Left--Neuroma, Bunion   LIPOSUCTION     abdominal   SUBMUCOSAL INJECTION  05/23/2020   Procedure: SUBMUCOSAL INJECTION;  Surgeon: Elicia Claw, MD;  Location: [facility];  Service: Gastroenterology;;   TONSILLECTOMY     as a child   WRIST SURGERY     Right wrist---Benign mass   Patient Active Problem List   Diagnosis Date Noted   Panic attacks 11/19/2014   Pap smear for cervical cancer screening 05/21/2014   Cough 09/27/2013   Thoracic back pain 08/18/2011   Conjunctivitis 07/16/2011   General medical examination 01/06/2011   DYSPAREUNIA 02/10/2010   VAGINITIS, ATROPHIC 02/10/2010   Pilonidal cyst 02/10/2010   PNEUMONIA 01/16/2010   INCONTINENCE, MIXED, URGE/STRESS  01/16/2010   CHEST PAIN UNSPECIFIED 10/04/2009   MALAISE AND FATIGUE 09/10/2009   DIARRHEA, CHRONIC 09/10/2009   Subjective visual disturbance 06/10/2009   GANGLION CYST, WRIST, LEFT 06/10/2009   Asymptomatic postmenopausal status 06/10/2009   PLANTAR FASCIITIS 12/13/2008   RESTLESS LEGS SYNDROME 09/05/2008   Impacted cerumen 07/23/2008   DEPRESSION 07/04/2008   Infective otitis externa 07/04/2008   Acute upper respiratory infection 07/04/2008   SWEATING 08/11/2007   Hyperlipidemia 08/02/2007   Alcohol abuse 08/02/2007   SINUSITIS- ACUTE-NOS 05/11/2007   Allergic rhinitis 05/11/2007   PAIN IN JOINT, HAND 03/09/2007   Hypothyroidism 12/15/2006   Hemorrhoids 12/15/2006   GERD 12/15/2006   Hematuria 12/15/2006   Disorder of bone and cartilage 12/15/2006   Seizure disorder (HCC) 12/15/2006    PCP: Madelin Brought  REFERRING PROVIDER: Toribio Higashi  REFERRING DIAG: L total hip replacement   THERAPY DIAG:  No diagnosis found.  Rationale for Evaluation and Treatment: Rehabilitation  ONSET DATE: 04/07/24  SUBJECTIVE:   SUBJECTIVE STATEMENT: ***  PERTINENT HISTORY: See above L THA 04/07/24   PAIN:  Are you having pain? {OPRCPAIN:27236}  PRECAUTIONS: {Therapy precautions:24002}  RED FLAGS: {PT Red Flags:29287}   WEIGHT BEARING RESTRICTIONS: {Yes ***/No:24003}  FALLS:  Has patient fallen in last 6 months? {fallsyesno:27318}  LIVING ENVIRONMENT: Lives with: {OPRC lives with:25569::lives with their family} Lives in: {Lives in:25570} Stairs: {opstairs:27293} Has following equipment at home: {Assistive devices:23999}  OCCUPATION: ***  PLOF: {PLOF:24004}  PATIENT GOALS: ***  NEXT MD VISIT: ***  OBJECTIVE:  Note: Objective measures were completed at Evaluation unless otherwise noted.  DIAGNOSTIC FINDINGS: ***  PATIENT SURVEYS:  {rehab surveys:24030}  COGNITION: Overall cognitive status: {cognition:24006}     SENSATION: {sensation:27233}  EDEMA:   {edema:24020}  MUSCLE LENGTH: Hamstrings: Right *** deg; Left *** deg Debby test: Right *** deg; Left *** deg  POSTURE: {posture:25561}  PALPATION: ***  LOWER EXTREMITY ROM:  {AROM/PROM:27142} ROM Right eval Left eval  Hip flexion    Hip extension    Hip abduction    Hip adduction    Hip internal rotation    Hip external rotation    Knee flexion    Knee extension    Ankle dorsiflexion    Ankle plantarflexion    Ankle inversion    Ankle eversion     (Blank rows = not tested)  LOWER EXTREMITY MMT:  MMT Right eval Left eval  Hip flexion    Hip extension    Hip abduction    Hip adduction    Hip internal rotation    Hip external rotation    Knee flexion    Knee extension    Ankle dorsiflexion    Ankle plantarflexion    Ankle inversion    Ankle eversion     (Blank rows = not tested)  LOWER EXTREMITY SPECIAL TESTS:  {LEspecialtests:26242}  FUNCTIONAL TESTS:  {Functional tests:24029}  GAIT: Distance walked: *** Assistive device utilized: {Assistive devices:23999} Level of assistance: {Levels of assistance:24026} Comments: ***                                                                                                                                TREATMENT DATE: ***    PATIENT EDUCATION:  Education details: *** Person educated: {Person educated:25204} Education method: {Education Method:25205} Education comprehension: {Education Comprehension:25206}  HOME EXERCISE PROGRAM: ***  ASSESSMENT:  CLINICAL IMPRESSION: Patient is a *** y.o. *** who was seen today for physical therapy evaluation and treatment for ***.   OBJECTIVE IMPAIRMENTS: {opptimpairments:25111}.   ACTIVITY LIMITATIONS: {activitylimitations:27494}  PARTICIPATION LIMITATIONS: {participationrestrictions:25113}  PERSONAL FACTORS: {Personal factors:25162} are also affecting patient's functional outcome.   REHAB POTENTIAL: {rehabpotential:25112}  CLINICAL DECISION MAKING:  {clinical decision making:25114}  EVALUATION COMPLEXITY: {Evaluation complexity:25115}   GOALS: Goals reviewed with patient? {yes/no:20286}  SHORT TERM GOALS: Target date: *** *** Baseline: Goal status: INITIAL  2.  *** Baseline:  Goal status: INITIAL  3.  *** Baseline:  Goal status: INITIAL  4.  *** Baseline:  Goal status: INITIAL  5.  *** Baseline:  Goal status: INITIAL  6.  *** Baseline:  Goal status: INITIAL  LONG TERM GOALS: Target date: ***  *** Baseline:  Goal status: INITIAL  2.  *** Baseline:  Goal status: INITIAL  3.  *** Baseline:  Goal status: INITIAL  4.  *** Baseline:  Goal status: INITIAL  5.  *** Baseline:  Goal status: INITIAL  6.  *** Baseline:  Goal status: INITIAL   PLAN:  PT FREQUENCY: {rehab frequency:25116}  PT DURATION: {rehab duration:25117}  PLANNED INTERVENTIONS: {rehab planned interventions:25118::97110-Therapeutic exercises,97530- Therapeutic [phone]- Neuromuscular re-education,97535- Self Rjmz,02859- Manual therapy,Patient/Family education}  PLAN FOR NEXT SESSION: ***   Almetta Fam, PT 04/20/2024, 9:51 AM

## 2024-04-21 ENCOUNTER — Ambulatory Visit: Attending: Orthopedic Surgery

## 2024-04-25 ENCOUNTER — Other Ambulatory Visit: Payer: Self-pay

## 2024-04-25 ENCOUNTER — Encounter (HOSPITAL_COMMUNITY): Payer: Self-pay | Admitting: Emergency Medicine

## 2024-04-25 ENCOUNTER — Emergency Department (HOSPITAL_COMMUNITY)

## 2024-04-25 ENCOUNTER — Observation Stay (HOSPITAL_COMMUNITY)
Admission: EM | Admit: 2024-04-25 | Discharge: 2024-04-26 | Disposition: A | Discharge status: Home / Self Care | Attending: Orthopedic Surgery | Admitting: Orthopedic Surgery

## 2024-04-25 DIAGNOSIS — Z87891 Personal history of nicotine dependence: Secondary | ICD-10-CM | POA: Diagnosis not present

## 2024-04-25 DIAGNOSIS — Z79899 Other long term (current) drug therapy: Secondary | ICD-10-CM | POA: Diagnosis not present

## 2024-04-25 DIAGNOSIS — W19XXXA Unspecified fall, initial encounter: Secondary | ICD-10-CM | POA: Diagnosis not present

## 2024-04-25 DIAGNOSIS — F1010 Alcohol abuse, uncomplicated: Secondary | ICD-10-CM | POA: Diagnosis not present

## 2024-04-25 DIAGNOSIS — Z7982 Long term (current) use of aspirin: Secondary | ICD-10-CM | POA: Insufficient documentation

## 2024-04-25 DIAGNOSIS — S72001A Fracture of unspecified part of neck of right femur, initial encounter for closed fracture: Secondary | ICD-10-CM | POA: Diagnosis present

## 2024-04-25 DIAGNOSIS — M9702XA Periprosthetic fracture around internal prosthetic left hip joint, initial encounter: Principal | ICD-10-CM

## 2024-04-25 DIAGNOSIS — S92102A Unspecified fracture of left talus, initial encounter for closed fracture: Principal | ICD-10-CM | POA: Insufficient documentation

## 2024-04-25 MED ORDER — ZOLPIDEM TARTRATE 5 MG PO TABS
5.0000 mg | ORAL_TABLET | Freq: Every day | ORAL | Status: DC
  Administered 2024-04-25: 5 mg via ORAL
  Filled 2024-04-25: qty 1

## 2024-04-25 MED ORDER — PRAMIPEXOLE DIHYDROCHLORIDE 0.25 MG PO TABS
2.0000 mg | ORAL_TABLET | Freq: Two times a day (BID) | ORAL | Status: DC
  Administered 2024-04-25: 2 mg via ORAL
  Filled 2024-04-25: qty 8

## 2024-04-25 MED ORDER — HYDROXYZINE PAMOATE 25 MG PO CAPS: 25.0000 mg | ORAL_CAPSULE | Freq: Every day | ORAL | Status: DC

## 2024-04-25 MED ORDER — ASPIRIN 81 MG PO TBEC
81.0000 mg | DELAYED_RELEASE_TABLET | Freq: Two times a day (BID) | ORAL | Status: DC
  Administered 2024-04-25 – 2024-04-26 (×2): 81 mg via ORAL
  Filled 2024-04-25 (×2): qty 1

## 2024-04-25 MED ORDER — HYDROMORPHONE HCL 1 MG/ML IJ SOLN: 0.5000 mg | INTRAMUSCULAR | Status: DC | PRN

## 2024-04-25 MED ORDER — PRAMIPEXOLE DIHYDROCHLORIDE 1 MG PO TABS: 3.0000 mg | ORAL_TABLET | Freq: Every day | ORAL | Status: DC

## 2024-04-25 MED ORDER — LEVETIRACETAM 250 MG PO TABS
750.0000 mg | ORAL_TABLET | Freq: Two times a day (BID) | ORAL | Status: DC
  Administered 2024-04-25 – 2024-04-26 (×2): 750 mg via ORAL
  Filled 2024-04-25 (×2): qty 1

## 2024-04-25 MED ORDER — ROSUVASTATIN CALCIUM 20 MG PO TABS: 10.0000 mg | ORAL_TABLET | Freq: Every day | ORAL | Status: DC

## 2024-04-25 MED ORDER — VITAMIN D 25 MCG (1000 UNIT) PO TABS
1000.0000 [IU] | ORAL_TABLET | Freq: Every day | ORAL | Status: DC
  Administered 2024-04-26: 1000 [IU] via ORAL
  Filled 2024-04-25: qty 1

## 2024-04-25 MED ORDER — BUSPIRONE HCL 10 MG PO TABS
10.0000 mg | ORAL_TABLET | Freq: Two times a day (BID) | ORAL | Status: DC
Start: 2024-04-25 — End: 2024-04-26
  Administered 2024-04-25 – 2024-04-26 (×2): 10 mg via ORAL
  Filled 2024-04-25: qty 2
  Filled 2024-04-25 (×2): qty 1
  Filled 2024-04-25: qty 2

## 2024-04-25 MED ORDER — METHOCARBAMOL 500 MG PO TABS
500.0000 mg | ORAL_TABLET | Freq: Four times a day (QID) | ORAL | Status: DC | PRN
  Administered 2024-04-25 – 2024-04-26 (×2): 500 mg via ORAL
  Filled 2024-04-25 (×2): qty 1

## 2024-04-25 MED ORDER — LEVOTHYROXINE SODIUM 25 MCG PO TABS
125.0000 ug | ORAL_TABLET | Freq: Every day | ORAL | Status: DC
  Administered 2024-04-26: 125 ug via ORAL
  Filled 2024-04-25: qty 1

## 2024-04-25 MED ORDER — LEVETIRACETAM 100 MG/ML PO SOLN: 1000.0000 mg | Freq: Two times a day (BID) | ORAL | Status: DC

## 2024-04-25 MED ORDER — ACETAMINOPHEN 500 MG PO TABS
1000.0000 mg | ORAL_TABLET | Freq: Three times a day (TID) | ORAL | Status: DC
  Administered 2024-04-25 – 2024-04-26 (×3): 1000 mg via ORAL
  Filled 2024-04-25 (×3): qty 2

## 2024-04-25 MED ORDER — KETOROLAC TROMETHAMINE 30 MG/ML IJ SOLN
15.0000 mg | Freq: Three times a day (TID) | INTRAMUSCULAR | Status: DC
  Administered 2024-04-25 – 2024-04-26 (×3): 15 mg via INTRAVENOUS
  Filled 2024-04-25 (×3): qty 1

## 2024-04-25 MED ORDER — POLYETHYLENE GLYCOL 3350 17 G PO PACK: 17.0000 g | PACK | Freq: Every day | ORAL | Status: DC | PRN

## 2024-04-25 MED ORDER — OXYCODONE HCL 5 MG PO TABS
5.0000 mg | ORAL_TABLET | ORAL | Status: DC | PRN
  Administered 2024-04-26: 10 mg via ORAL
  Administered 2024-04-26: 5 mg via ORAL
  Filled 2024-04-25: qty 1
  Filled 2024-04-25: qty 2

## 2024-04-25 MED ORDER — DILTIAZEM HCL ER COATED BEADS 120 MG PO CP24: 120.0000 mg | ORAL_CAPSULE | Freq: Every day | ORAL | Status: DC

## 2024-04-25 MED ORDER — BENZONATATE 100 MG PO CAPS: 100.0000 mg | ORAL_CAPSULE | Freq: Three times a day (TID) | ORAL | Status: DC

## 2024-04-25 MED ORDER — METHOCARBAMOL 1000 MG/10ML IJ SOLN: 500.0000 mg | Freq: Four times a day (QID) | INTRAMUSCULAR | Status: DC | PRN

## 2024-04-25 MED ORDER — SERTRALINE HCL 50 MG PO TABS
100.0000 mg | ORAL_TABLET | Freq: Every day | ORAL | Status: DC
  Administered 2024-04-26: 100 mg via ORAL
  Filled 2024-04-25: qty 2

## 2024-04-25 MED ORDER — OXYCODONE HCL 5 MG PO TABS
5.0000 mg | ORAL_TABLET | Freq: Once | ORAL | Status: AC
  Administered 2024-04-25: 5 mg via ORAL
  Filled 2024-04-25: qty 1

## 2024-04-25 MED ORDER — HYDROXYZINE HCL 25 MG PO TABS
25.0000 mg | ORAL_TABLET | Freq: Three times a day (TID) | ORAL | Status: DC | PRN
  Administered 2024-04-25: 25 mg via ORAL
  Filled 2024-04-25: qty 1

## 2024-04-25 MED ORDER — GABAPENTIN 400 MG PO CAPS
1200.0000 mg | ORAL_CAPSULE | Freq: Every day | ORAL | Status: DC
  Administered 2024-04-25: 1200 mg via ORAL
  Filled 2024-04-25: qty 3

## 2024-04-25 NOTE — ED Provider Notes (Signed)
 [place] EMERGENCY DEPARTMENT AT [facility] Provider Note   CSN: [id number] Arrival date & time: 04/25/24  9148     Patient presents with: Megan Singleton   Megan Singleton is a 73 y.o. female.   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 The patient states that early this AM, she was ambulating to bedside commode when her left leg gave out and she fell onto the ground. Patient reports her pain right now is 1/10 at rest, but 8/10 when moving hip.  She is unable to stand or ambulate at this time. Since her surgery 2 weeks ago, she has been having left hip pain and knee pain, but much less severe.  She has been able to ambulate with walker. Patient denies hitting head, LOC, dizziness, headache.  Denies dysarthria, weakness, confusion. She states she does not think she is on blood thinners, verified with husband when he brought meds list. Patient is taking oxycodone  and methocarbamol for pain at home. Husband notes that patient had been recovering well from her surgery and was able to ambulate up the stairs in their 2 story house, but had been increasingly weak for the past 24 hours.  Husband expresses concern that he may not be able to take care of the patient well at this point.   Fall Pertinent negatives include no chest pain, no abdominal pain, no headaches and no shortness of breath.     Prior to Admission medications   Medication Sig Start Date End Date Taking? Authorizing Provider  busPIRone (BUSPAR) 10 MG tablet Take 10 mg by mouth 2 (two) times daily.   Yes [provider]  gabapentin (NEURONTIN) 600 MG tablet Take 1,800 mg by mouth daily.   Yes [provider]  levothyroxine  (SYNTHROID ) 125 MCG tablet Take 125 mcg by mouth daily before breakfast.   Yes [provider]  MYRBETRIQ 50 MG TB24 tablet Take  50 mg by mouth daily.   Yes [provider]  pramipexole  (MIRAPEX ) 1.5 MG tablet Take 6 mg by mouth See admin instructions. 2 tabs in AM and 2 tabs in PM   Yes [provider]  sertraline (ZOLOFT) 100 MG tablet Take 100 mg by mouth daily. 12/30/23  Yes [provider]  benzonatate  (TESSALON ) 100 MG capsule Take 1 capsule (100 mg total) by mouth every 8 (eight) hours. Patient not taking: Reported on 06/02/2022 03/22/21   Megan Carlin NOVAK, MD  Cyanocobalamin (B-12 PO) Take by mouth.    [provider]  diltiazem  (CARDIZEM  CD) 120 MG 24 hr capsule Take 1 capsule (120 mg total) by mouth daily. 10/08/23   Nahser, Aleene PARAS, MD  hydrOXYzine  (ATARAX /VISTARIL ) 25 MG tablet Take 1 tablet (25 mg total) by mouth every 8 (eight) hours as needed for anxiety. 03/22/21   Megan Carlin NOVAK, MD  hydrOXYzine  (VISTARIL ) 25 MG capsule Take 1 capsule by mouth. 01/02/23   [provider]  levETIRAcetam (KEPPRA) 100 MG/ML solution Take 10 mLs by mouth in the morning and at bedtime. 05/10/20   [provider]  methylPREDNISolone  (MEDROL  DOSEPAK) 4 MG TBPK tablet Per dose pack instructions starting 08/01/2022 07/31/22   Armenta Canning, MD  oxyCODONE -acetaminophen  (PERCOCET/ROXICET) 5-325 MG tablet Take 1-2 tablets by mouth every 6 (six) hours as needed for severe pain. 07/31/22   Armenta Canning, MD  pantoprazole  (PROTONIX ) 40  MG tablet TAKE 1 TABLET BY MOUTH EVERY DAY Patient not taking: Reported on 06/02/2022 10/05/15   Tabori, Katherine E, MD  pramipexole  (MIRAPEX ) 1 MG tablet 3 tabs Orally at bedtime    [provider]  Probiotic Product (ALIGN PO) Take by mouth.    [provider]  rosuvastatin  (CRESTOR ) 10 MG tablet Take 1 tablet (10 mg total) by mouth daily. 09/10/23   Megan Hamilton T, PA-C    Allergies: Morphine and Haloperidol  and related    Review of Systems  Constitutional:  Negative for chills and fever.  HENT:  Negative for ear pain and sore  throat.   Eyes:  Negative for pain and visual disturbance.  Respiratory:  Negative for cough and shortness of breath.   Cardiovascular:  Negative for chest pain and palpitations.  Gastrointestinal:  Negative for abdominal pain and vomiting.  Genitourinary:  Negative for dysuria and hematuria.  Musculoskeletal:  Negative for arthralgias and back pain.  Skin:  Negative for color change and rash.  Neurological:  Negative for dizziness, seizures, syncope, facial asymmetry, speech difficulty, weakness, light-headedness and headaches.  All other systems reviewed and are negative.   Updated Vital Signs BP 138/68 (BP Location: Left Arm)   Pulse 85   Temp 98.5 F (36.9 C)   Resp 16   SpO2 100%   Physical Exam Vitals and nursing note reviewed.  Constitutional:      General: She is not in acute distress.    Appearance: Normal appearance. She is well-developed.  HENT:     Head: Normocephalic and atraumatic.  Eyes:     General: No visual field deficit.    Conjunctiva/sclera: Conjunctivae normal.     Pupils: Pupils are equal, round, and reactive to light.  Cardiovascular:     Rate and Rhythm: Normal rate and regular rhythm.     Heart sounds: No murmur heard. Pulmonary:     Effort: Pulmonary effort is normal. No respiratory distress.     Breath sounds: Normal breath sounds.  Abdominal:     Palpations: Abdomen is soft.     Tenderness: There is no abdominal tenderness.  Musculoskeletal:        General: Tenderness (left inguinal and SI joint tenderenss, negative log roll on left side; L knee extension and flexion normal, hip flexion limited by pain) present. No swelling or deformity.     Cervical back: Neck supple.     Right lower leg: Normal.     Left lower leg: No lacerations. No edema.  Skin:    General: Skin is warm and dry.     Capillary Refill: Capillary refill takes less than 2 seconds.  Neurological:     Mental Status: She is alert and oriented to person, place, and time.      GCS: GCS eye subscore is 4. GCS verbal subscore is 5. GCS motor subscore is 6.     Cranial Nerves: Cranial nerves 2-12 are intact. No cranial nerve deficit, dysarthria or facial asymmetry.     Sensory: Sensation is intact. No sensory deficit.     Motor: Motor function is intact.     Coordination: Coordination is intact.  Psychiatric:        Mood and Affect: Mood normal.    (all labs ordered are listed, but only abnormal results are displayed) Labs Reviewed - No data to display  EKG: None  Radiology: CT Hip Left Wo Contrast Result Date: 04/25/2024 CLINICAL DATA:  Hip replacement, periprosthetic fracture suspected. History of fall  several weeks ago. EXAM: CT OF THE LEFT HIP WITHOUT CONTRAST TECHNIQUE: Multidetector CT imaging of the left hip was performed according to the standard protocol. Multiplanar CT image reconstructions were also generated. RADIATION DOSE REDUCTION: This exam was performed according to the departmental dose-optimization program which includes automated exposure control, adjustment of the mA and/or kV according to patient size and/or use of iterative reconstruction technique. COMPARISON:  Left hip radiographs dated 04/25/2024. MRI of the left hip dated 03/10/2024. FINDINGS: Bones/Joint/Cartilage Status post left total hip arthroplasty with associated streak artifact, which limits detailed evaluation of the surrounding bone and soft tissues. Longitudinal linear lucency extending approximately 4.5 cm along the anterior medial cortex of the left proximal femoral diaphysis, adjacent to the proximal stem of the femoral prosthesis (series 5, images 56-69 and series 7, images 64-66), concerning for nondisplaced periprosthetic fracture. Left hip arthroplasty is anatomically aligned with intact hardware. Visualized left sacroiliac joint and pubic symphysis are anatomically aligned with mild degenerative arthropathy. Ligaments Ligaments are suboptimally evaluated by CT. Muscles and  Tendons No appreciable acute abnormality. Soft tissue Postsurgical changes and soft tissue swelling along the left lateral hip. No loculated fluid collection. Other: Sigmoid colonic diverticulosis. No enlarged lymph nodes identified in the field of view. IMPRESSION: Status post left total hip arthroplasty with longitudinal linear lucency extending along the anterior medial cortex of the left proximal femoral diaphysis adjacent to the proximal stem of the femoral prosthesis, concerning for nondisplaced periprosthetic fracture. Electronically Signed   By: Harrietta Sherry M.D.   On: 04/25/2024 11:20   DG Hip Unilat W or Wo Pelvis 1 View Left Result Date: 04/25/2024 CLINICAL DATA:  Left hip pain after fall several weeks ago. EXAM: DG HIP (WITH OR WITHOUT PELVIS) 1V*L* COMPARISON:  None Available. FINDINGS: Status post left total hip arthroplasty. No fracture or dislocation is noted. IMPRESSION: No acute abnormality seen. Electronically Signed   By: Lynwood Landy Raddle M.D.   On: 04/25/2024 09:47   DG Pelvis 1-2 Views Result Date: 04/25/2024 CLINICAL DATA:  Left hip pain after fall. EXAM: PELVIS - 1-2 VIEW COMPARISON:  None Available. FINDINGS: There is no evidence of pelvic fracture or diastasis. No pelvic bone lesions are seen. Status post left hip arthroplasty. IMPRESSION: No acute abnormality seen. Electronically Signed   By: Lynwood Landy Raddle M.D.   On: 04/25/2024 09:46     Procedures   Medications Ordered in the ED  rosuvastatin  (CRESTOR ) tablet 10 mg (has no administration in time range)  busPIRone (BUSPAR) tablet 10 mg (has no administration in time range)  diltiazem  (CARDIZEM  CD) 24 hr capsule 120 mg (has no administration in time range)  hydrOXYzine  (ATARAX ) tablet 25 mg (has no administration in time range)  hydrOXYzine  (VISTARIL ) capsule 25 mg (has no administration in time range)  sertraline (ZOLOFT) tablet 100 mg (has no administration in time range)  levothyroxine  (SYNTHROID ) tablet 125 mcg  (has no administration in time range)  gabapentin (NEURONTIN) tablet 1,200 mg (has no administration in time range)  levETIRAcetam (KEPPRA) 100 MG/ML solution 1,000 mg (has no administration in time range)  pramipexole  (MIRAPEX ) tablet 3 mg (has no administration in time range)  benzonatate  (TESSALON ) capsule 100 mg (has no administration in time range)  aspirin  EC tablet 81 mg (has no administration in time range)  oxyCODONE  (Oxy IR/ROXICODONE ) immediate release tablet 5-10 mg (has no administration in time range)  HYDROmorphone  (DILAUDID ) injection 0.5 mg (has no administration in time range)  methocarbamol (ROBAXIN) tablet 500 mg (has no  administration in time range)    Or  methocarbamol (ROBAXIN) injection 500 mg (has no administration in time range)  polyethylene glycol (MIRALAX / GLYCOLAX) packet 17 g (has no administration in time range)  acetaminophen  (TYLENOL ) tablet 1,000 mg (has no administration in time range)  ketorolac  (TORADOL ) 30 MG/ML injection 15 mg (has no administration in time range)  oxyCODONE  (Oxy IR/ROXICODONE ) immediate release tablet 5 mg (5 mg Oral Given 04/25/24 1023)                                    Medical Decision Making Charita Lindenberger is a 73 year old female with recent left hip replacement presenting after fall at home and with left hip pain.  Patient did not hit head and is not on blood thinners, no indication for CT head at this time.  No concern for stroke given unremarkable neurological exam and no history of dysarthria, facial droop, or unilateral weakness recently.  Suspect patient's fall was mechanical in nature.  Less likely hip dislocation given that hip is not held in internal/external rotation and does have some range of motion. Given recent hip surgery and severity of pain radiography of left hip and pelvis were obtained, showing no acute pathology. Patient was still unable to ambulate after oxycodone  for pain control, so CT L hip was obtained,  showing longitudinal linear lucency extending extending along femoral prosthesis concerning for nondisplaced periprosthetic fracture.  Called [facility] on-call physician, who connected me with Dr. Edna, the patient's surgeon.  Dr. Edna plans to admit for pain control, though does not expect surgical intervention will be indicated.  Amount and/or Complexity of Data Reviewed Radiology: ordered and independent interpretation performed. Decision-making details documented in ED Course. ECG/medicine tests: ordered and independent interpretation performed. Decision-making details documented in ED Course.  Risk Prescription drug management. Decision regarding hospitalization.      Final diagnoses:  Periprosthetic fracture around internal prosthetic left hip joint, initial encounter [facility])    ED Discharge Orders     None        Willer Osorno, MD 04/25/24 1501    Dasie Faden, MD 04/26/24 [phone]

## 2024-04-25 NOTE — H&P (Signed)
 ORTHOPAEDIC H&P   Chief Complaint: left hip fracture  HPI: Megan Singleton is a 73 y.o. female who complains of  Underwent left total hip arthroplasty 2 weeks ago on 04/07/2024.  She had been doing well at home until yesterday.  She started having significant difficulty walking and suffered a fall.  Was unable to ambulate and brought into the emergency room.  She has significant pain discomfort localized to the left hip and groin area.  Denies pain in her joints or extremities.  Past Medical History:  Diagnosis Date   Alcohol abuse    Allergy    Anxiety    Depression    Fibromyalgia    GERD (gastroesophageal reflux disease)    Hashimoto's thyroiditis    Hyperlipidemia    IBS (irritable bowel syndrome)    Osteopenia    Prolonged QT interval    RLS (restless legs syndrome)    Seizure disorder (HCC)    she is on life long medication per Neuro and can not have driving privileges if she  stops meds   Seizures (HCC)    Thyroid  disease    Past Surgical History:  Procedure Laterality Date   BUNIONECTOMY     Right and Hammertoe   CESAREAN SECTION  8021,8009   x's 2   ESOPHAGEAL MANOMETRY N/A 12/09/2016   Procedure: ESOPHAGEAL MANOMETRY (EM);  Surgeon: Celestia Agent, MD;  Location: [facility];  Service: Endoscopy;  Laterality: N/A;   ESOPHAGOGASTRODUODENOSCOPY (EGD) WITH PROPOFOL  N/A 05/23/2020   Procedure: ESOPHAGOGASTRODUODENOSCOPY (EGD) WITH PROPOFOL  with Botox ;  Surgeon: Elicia Claw, MD;  Location: [facility];  Service: Gastroenterology;  Laterality: N/A;   FOOT SURGERY     Left--Neuroma, Bunion   LIPOSUCTION     abdominal   SUBMUCOSAL INJECTION  05/23/2020   Procedure: SUBMUCOSAL INJECTION;  Surgeon: Elicia Claw, MD;  Location: [facility];  Service: Gastroenterology;;   TONSILLECTOMY     as a child   WRIST SURGERY     Right wrist---Benign mass   Social History   Socioeconomic History   Marital status: Married    Spouse name: Not on file   Number of  children: Not on file   Years of education: Not on file   Highest education level: Not on file  Occupational History   Not on file  Tobacco Use   Smoking status: Former    Current packs/day: 0.00    Types: Cigarettes    Quit date: 07/21/1979    Years since quitting: 44.7   Smokeless tobacco: Never  Vaping Use   Vaping status: Never Used  Substance and Sexual Activity   Alcohol use: Yes    Alcohol/week: 21.0 standard drinks of alcohol    Types: 21 Shots of liquor per week   Drug use: No   Sexual activity: Not on file  Other Topics Concern   Not on file  Social History Narrative   Not on file   Social Drivers of Health   Financial Resource Strain: Low Risk  (04/15/2021)   Received from [facility]   Overall Financial Resource Strain (CARDIA)    Difficulty of Paying Living Expenses: Not hard at all  Food Insecurity: Low Risk  (10/05/2023)   Received from [facility]   Hunger Vital Sign    Within the past 12 months, you worried that your food would run out before you got money to buy more: Never true    Within the past 12 months, the food you bought just didn't last and  you didn't have money to get more. : Never true  Transportation Needs: No Transportation Needs (10/05/2023)   Received from [facility]   Transportation    In the past 12 months, has lack of reliable transportation kept you from medical appointments, meetings, work or from getting things needed for daily living? : No  Physical Activity: Inactive (04/15/2021)   Received from [facility]   Exercise Vital Sign    On average, how many days per week do you engage in moderate to strenuous exercise (like a brisk walk)?: 1 day    On average, how many minutes do you engage in exercise at this level?: 0 min  Stress: Stress Concern Present (04/15/2021)   Received from [facility] - Occupational Stress Questionnaire    Feeling of Stress : To some extent  Social  Connections: Unknown (11/21/2021)   Received from [facility]   Social Network    Social Network: Not on file   Family History  Problem Relation Age of Onset   Breast cancer Mother    Hyperlipidemia Mother    Prostate cancer Father    Hyperlipidemia Father    Depression Father    Cancer Cousin        Colon Cancer   Diabetes Other        Maternal Grandparent   Kidney disease Other        Maternal Grandparent   Stroke Other        Grandparent   Heart disease Other        Grand Parent   Allergies  Allergen Reactions   Morphine Rash    Other reaction(s): Other (See Comments)   Haloperidol  And Related Other (See Comments)    Akathisia     Positive ROS: All other systems have been reviewed and were otherwise negative with the exception of those mentioned in the HPI and as above.  Physical Exam: General: Alert, no acute distress Cardiovascular: No pedal edema Respiratory: No cyanosis, no use of accessory musculature Skin: No lesions in the area of chief complaint Neurologic: Sensation intact distally   IMAGING: X-rays and CT scan reviewed demonstrate a nondisplaced periprosthetic left femur fracture through the calcar  Assessment: Nondisplaced periprosthetic left calcar fracture  Plan: There is no evidence of stem subsidence and there is a unicortical crack in the calcar.  I am hopeful with protected weightbearing this will heal and his femoral ingrowth as expected.  Would recommend 20% partial weightbearing and admission for pain control and physical therapy.  Hopeful she will be able to mobilize in the next couple of days and be safe for discharge back home.    TORIBIO DELENA HIGASHI, MD  Contact information:   [id number] 7am-5pm epic message Dr. HIGASHI, or call office for patient follow up: [phone] After hours and holidays please check [email or web address] for group call information for [facility]

## 2024-04-25 NOTE — ED Triage Notes (Signed)
 BIB EMS from home.  Had left hip replacement 2 1/2 weeks ago.  Clemens this morning falling on bottom and clipped left hip. Shortening to left leg noted by EMS. Tenderness to palpation.  Cannot bear weight.  Incision looks clean.  No redness.  Called her physician and attempted to come POV but was not able to get into vehicle so EMS was called.

## 2024-04-25 NOTE — ED Provider Notes (Signed)
 I saw and evaluated the patient, reviewed the resident's note and I agree with the findings and plan.  Patient presents with mechanical fall at home.  No head injury.  Complains of pain to her left hip.  On exam, no shortening rotation noted.  Patient able to raise her leg off the bed.  Is tender over her left lateral hip.  Will x-ray       Megan Faden, MD 04/25/24 [phone]

## 2024-04-26 MED ORDER — OXYCODONE HCL 5 MG PO TABS: 5.0000 mg | ORAL_TABLET | ORAL | 0 refills | Status: DC | PRN

## 2024-04-26 NOTE — Plan of Care (Signed)
   Problem: Clinical Measurements: Goal: Ability to maintain clinical measurements within normal limits will improve Outcome: Progressing Goal: Will remain free from infection Outcome: Progressing

## 2024-04-26 NOTE — Progress Notes (Signed)
   04/26/24 1043  TOC Brief Assessment  Insurance and Status Reviewed  Patient has primary care physician Yes  Home environment has been reviewed home  Prior level of function: independent  Prior/Current Home Services No current home services  Social Drivers of Health Review SDOH reviewed no interventions necessary  Readmission risk has been reviewed Yes  Transition of care needs no transition of care needs at this time

## 2024-04-26 NOTE — Progress Notes (Signed)
     Subjective:  Patient reports pain as mild.  Pain better controlled today. Did get up to commode. Plan for PT today. Possible discharge home later today. Patient is requesting walker.  Yesterday's total administered Morphine Milligram Equivalents: 7.5   Objective:   VITALS:   Vitals:   04/25/24 1418 04/25/24 2015 04/26/24 0116 04/26/24 0646  BP: 138/68 132/60 125/69 133/74  Pulse: 85 75 72 63  Resp: 16 16 17 16   Temp: 98.5 F (36.9 C) 98.7 F (37.1 C) 98 F (36.7 C) 98.1 F (36.7 C)  TempSrc:      SpO2: 100% 96% 93% 96%  Weight: 68.9 kg     Height: 5' 4 (1.626 m)       Sensation intact distally Intact pulses distally Dorsiflexion/Plantar flexion intact Compartment soft No pain with gentle left hip rom Incisin is healing well.  Lab Results  Component Value Date   WBC 10.3 01/01/2024   HGB 14.1 01/01/2024   HCT 40.8 01/01/2024   MCV 88.5 01/01/2024   PLT 245 01/01/2024   BMET    Component Value Date/Time   NA 140 01/01/2024 1830   K 3.4 (L) 01/01/2024 1830   CL 102 01/01/2024 1830   CO2 20 (L) 01/01/2024 1830   GLUCOSE 125 (H) 01/01/2024 1830   GLUCOSE 121 01/16/2009 0000   BUN 13 01/01/2024 1830   CREATININE 0.67 01/01/2024 1830   CALCIUM  9.4 01/01/2024 1830   GFRNONAA >60 01/01/2024 1830    Assessment/Plan:     Principal Problem:   Periprosthetic fracture around internal prosthetic left hip joint, initial encounter (HCC)  Nondisplaced periprosthetic left total hip arthroplasty  Plan for protected weightbearing, toe-touch weightbearing for likely 6 weeks.  Patient will work with physical therapy today on gait training.  Will likely benefit form having a wheelchair at home.  May discharge home today versus tomorrow pending progress with therapy and pain control.  Follow up in office in 1 week    Tobin Witucki A Taelyn Nemes 04/26/2024, 8:25 AM   Toribio Higashi, MD  Contact information:   [phone] 7am-5pm epic message Dr. Higashi, or call  office for patient follow up: [phone] After hours and holidays please check [email or web address] for group call information for [facility]

## 2024-04-26 NOTE — Progress Notes (Signed)
 Reviewed written d/c instructions w pt and all questions answered. She verbalized understanding. D/C via w/c w all belongings in stable condition.

## 2024-04-26 NOTE — Plan of Care (Signed)

## 2024-04-26 NOTE — Evaluation (Signed)
 Physical Therapy Evaluation Patient Details Name: Megan Singleton MRN: [id number] DOB: [birth year] Today's Date: 04/26/2024  History of Present Illness  73 y.o. female who complains of  Underwent left total hip arthroplasty 2 weeks ago on 04/07/2024.  She had been doing well at home until 04/24/24.  She started having significant difficulty walking and suffered a fall.  Was unable to ambulate and brought into the emergency room. Dx of L periprosthetic fx L proximal femur. PMH: EtOH, fibromyalgia, anxiety, IBS, seizures.  Clinical Impression  Pt admitted with above diagnosis. Pt ambulated 27' with RW with frequent verbal cues for toe touch weight bearing, no loss of balance. Pt was able to ascend/descend a step with RW with min A to steady the RW and verbal cues for sequencing. Pt reports her son is home during the day and can assist her as needed. From a PT standpoint she is ready to DC home. HHPT recommended.  Pt currently with functional limitations due to the deficits listed below (see PT Problem List). Pt will benefit from acute skilled PT to increase their independence and safety with mobility to allow discharge.           If plan is discharge home, recommend the following: A little help with walking and/or transfers;A little help with bathing/dressing/bathroom;Assistance with cooking/housework;Assist for transportation;Help with stairs or ramp for entrance   Can travel by private vehicle        Equipment Recommendations None recommended by PT  Recommendations for Other Services       Functional Status Assessment Patient has had a recent decline in their functional status and demonstrates the ability to make significant improvements in function in a reasonable and predictable amount of time.     Precautions / Restrictions Precautions Precautions: Fall Recall of Precautions/Restrictions: Intact Restrictions Weight Bearing Restrictions Per Provider Order: Yes LLE Weight Bearing Per  Provider Order: Touchdown weight bearing Other Position/Activity Restrictions: 20% TTWB      Mobility  Bed Mobility Overal bed mobility: Needs Assistance Bed Mobility: Sit to Supine       Sit to supine: Contact guard assist   General bed mobility comments: instructed pt to assist LLE with RLE for sit to supine    Transfers Overall transfer level: Needs assistance Equipment used: Rolling walker (2 wheels) Transfers: Sit to/from Stand Sit to Stand: Contact guard assist           General transfer comment: VCs for hand placement    Ambulation/Gait Ambulation/Gait assistance: Contact guard assist Gait Distance (Feet): 70 Feet Assistive device: Rolling walker (2 wheels) Gait Pattern/deviations: Step-to pattern Gait velocity: decr     General Gait Details: repeated VCs for TTWB, no loss of balance, distance limited by pain/fatigue  Stairs Stairs: Yes   Stair Management: No rails, Backwards, Step to pattern, With walker Number of Stairs: 1 General stair comments: VCs sequencing, min A to steady RW  Wheelchair Mobility     Tilt Bed    Modified Rankin (Stroke Patients Only)       Balance Overall balance assessment: History of Falls, Needs assistance   Sitting balance-Leahy Scale: Good     Standing balance support: Bilateral upper extremity supported, During functional activity, Reliant on assistive device for balance Standing balance-Leahy Scale: Poor                               Pertinent Vitals/Pain Pain Assessment Pain Assessment: 0-10 Pain Score: 7  Pain Location: L hip with walking Pain Descriptors / Indicators: Sore, Grimacing Pain Intervention(s): Limited activity within patient's tolerance, Monitored during session, Premedicated before session, Repositioned (pt declined ice)    Home Living Family/patient expects to be discharged to:: Private residence Living Arrangements: Spouse/significant other;Children Available Help at  Discharge: Family;Available 24 hours/day Type of Home: House Home Access: Stairs to enter Entrance Stairs-Rails: None Entrance Stairs-Number of Steps: 1 + 1   Home Layout: Two level;Able to live on main level with bedroom/bathroom Home Equipment: Agricultural consultant (2 wheels);BSC/3in1 Additional Comments: spouse works, son is home and can assist prn; pt plans to stay on 1st level in dining room    Prior Function Prior Level of Function : Independent/Modified Independent             Mobility Comments: walked with RW; fell just prior to admission       Extremity/Trunk Assessment   Upper Extremity Assessment Upper Extremity Assessment: Overall WFL for tasks assessed    Lower Extremity Assessment Lower Extremity Assessment: LLE deficits/detail LLE Deficits / Details: ankle WNL, knee ext at least 3/5, pt able to advance LLE to walk LLE: Unable to fully assess due to pain LLE Sensation: WNL    Cervical / Trunk Assessment Cervical / Trunk Assessment: Normal  Communication   Communication Communication: No apparent difficulties    Cognition Arousal: Alert Behavior During Therapy: WFL for tasks assessed/performed   PT - Cognitive impairments: Problem solving, Safety/Judgement, No family/caregiver present to determine baseline                         Following commands: Impaired Following commands impaired: Follows one step commands with increased time     Cueing Cueing Techniques: Verbal cues, Gestural cues     General Comments      Exercises     Assessment/Plan    PT Assessment All further PT needs can be met in the next venue of care  PT Problem List Decreased activity tolerance;Decreased balance;Decreased mobility;Pain       PT Treatment Interventions      PT Goals (Current goals can be found in the Care Plan section)  Acute Rehab PT Goals Patient Stated Goal: to go home PT Goal Formulation: With patient Time For Goal Achievement:  05/10/24 Potential to Achieve Goals: Good    Frequency       Co-evaluation               AM-PAC PT 6 Clicks Mobility  Outcome Measure Help needed turning from your back to your side while in a flat bed without using bedrails?: None Help needed moving from lying on your back to sitting on the side of a flat bed without using bedrails?: A Little Help needed moving to and from a bed to a chair (including a wheelchair)?: None Help needed standing up from a chair using your arms (e.g., wheelchair or bedside chair)?: None Help needed to walk in hospital room?: A Little Help needed climbing 3-5 steps with a railing? : A Little 6 Click Score: 21    End of Session Equipment Utilized During Treatment: Gait belt Activity Tolerance: Patient tolerated treatment well Patient left: in bed;with bed alarm set;with call bell/phone within reach Nurse Communication: Mobility status PT Visit Diagnosis: Difficulty in walking, not elsewhere classified (R26.2);Pain Pain - Right/Left: Left Pain - part of body: Hip    Time: 1101-1120 PT Time Calculation (min) (ACUTE ONLY): 19 min   Charges:  PT Evaluation $PT Eval Moderate Complexity: 1 Mod   PT General Charges $$ ACUTE PT VISIT: 1 Visit         Sylvan Delon Copp PT 04/26/2024  Acute Rehabilitation Services  Office [phone]

## 2024-04-27 NOTE — Discharge Summary (Signed)
 Physician Discharge Summary  Patient ID: Megan Singleton MRN: [id number] DOB/AGE: Jul 14, 1951 73 y.o.  Admit date: 04/25/2024 Discharge date: 04/27/2024  Admission Diagnoses:  Periprosthetic fracture around internal prosthetic left hip joint, initial encounter [facility])  Discharge Diagnoses:  Principal Problem:   Periprosthetic fracture around internal prosthetic left hip joint, initial encounter [facility])   Past Medical History:  Diagnosis Date   Alcohol abuse    Allergy    Anxiety    Depression    Fibromyalgia    GERD (gastroesophageal reflux disease)    Hashimoto's thyroiditis    Hyperlipidemia    IBS (irritable bowel syndrome)    Osteopenia    Prolonged QT interval    RLS (restless legs syndrome)    Seizure disorder (HCC)    she is on life long medication per Neuro and can not have driving privileges if she  stops meds   Seizures (HCC)    Thyroid  disease     Surgeries:  on * No surgery found *   Consultants (if any):   Discharged Condition: Improved  Hospital Course: Megan Singleton is an 73 y.o. female who was admitted 04/25/2024 with a diagnosis of Periprosthetic fracture around internal prosthetic left hip joint, initial encounter (HCC) .  She had undergone left total of arthroplasty for osteoarthritis 2 weeks prior.  She is having increasing pain in the left hip and thigh at home.  She sustained a fall.  Imaging demonstrated a nondisplaced periprosthetic femur fracture.  She was admitted for pain control and physical therapy.  This fracture was deemed stable and anticipated to heal with nonsurgical treatment.  The patient was able to mobilize and had improved pain control Hospital day 1 and was safely discharged home with home health physical therapy and a wheelchair.  She was given perioperative antibiotics:  Anti-infectives (From admission, onward)    None     .  She was given sequential compression devices, early ambulation, and aspirin  for DVT  prophylaxis.  She benefited maximally from the hospital stay and there were no complications.    Recent vital signs:  Vitals:   04/26/24 0921 04/26/24 1355  BP: (!) 143/71 (!) 145/82  Pulse: 75 86  Resp: 18 18  Temp: 98.2 F (36.8 C) (!) 97.4 F (36.3 C)  SpO2: 98% 98%    Recent laboratory studies:  Lab Results  Component Value Date   HGB 14.1 01/01/2024   HGB 14.9 08/02/2023   HGB 15.0 04/03/2023   Lab Results  Component Value Date   WBC 10.3 01/01/2024   PLT 245 01/01/2024   No results found for: INR Lab Results  Component Value Date   NA 140 01/01/2024   K 3.4 (L) 01/01/2024   CL 102 01/01/2024   CO2 20 (L) 01/01/2024   BUN 13 01/01/2024   CREATININE 0.67 01/01/2024   GLUCOSE 125 (H) 01/01/2024    Discharge Medications:   Allergies as of 04/26/2024       Reactions   Morphine Rash   Other reaction(s): Other (See Comments)   Haloperidol  And Related Other (See Comments)   Akathisia        Medication List     TAKE these medications    aspirin  81 MG chewable tablet Chew 162 mg by mouth See admin instructions. 1 tab in AM and 1 tab in PM   B-12 PO Take by mouth.   benzonatate  100 MG capsule Commonly known as: TESSALON  Take 1 capsule (100 mg total) by mouth every  8 (eight) hours.   busPIRone 10 MG tablet Commonly known as: BUSPAR Take 10 mg by mouth 2 (two) times daily.   diltiazem  120 MG 24 hr capsule Commonly known as: Cardizem  CD Take 1 capsule (120 mg total) by mouth daily.   gabapentin 600 MG tablet Commonly known as: NEURONTIN Take 1,800 mg by mouth daily.   hydrOXYzine  25 MG capsule Commonly known as: VISTARIL  Take 1 capsule by mouth.   hydrOXYzine  25 MG tablet Commonly known as: ATARAX  Take 1 tablet (25 mg total) by mouth every 8 (eight) hours as needed for anxiety.   levETIRAcetam 750 MG tablet Commonly known as: KEPPRA Take 750 mg by mouth 2 (two) times daily.   levETIRAcetam 100 MG/ML solution Commonly known as:  KEPPRA Take 10 mLs by mouth in the morning and at bedtime.   levothyroxine  125 MCG tablet Commonly known as: SYNTHROID  Take 125 mcg by mouth daily before breakfast.   methocarbamol 500 MG tablet Commonly known as: ROBAXIN Take 500 mg by mouth every 6 (six) hours as needed for muscle spasms.   Myrbetriq 50 MG Tb24 tablet Generic drug: mirabegron ER Take 50 mg by mouth daily.   oxyCODONE  5 MG immediate release tablet Commonly known as: Roxicodone  Take 1 tablet (5 mg total) by mouth every 4 (four) hours as needed for up to 7 days for severe pain (pain score 7-10) or moderate pain (pain score 4-6). What changed:  when to take this reasons to take this   pramipexole  1 MG tablet Commonly known as: MIRAPEX  3 tabs Orally at bedtime   pramipexole  1.5 MG tablet Commonly known as: MIRAPEX  Take 6 mg by mouth See admin instructions. 2 tabs in AM and 2 tabs in PM   rosuvastatin  10 MG tablet Commonly known as: CRESTOR  Take 1 tablet (10 mg total) by mouth daily.   sertraline 100 MG tablet Commonly known as: ZOLOFT Take 100 mg by mouth daily.        Diagnostic Studies: CT Hip Left Wo Contrast Result Date: 04/25/2024 CLINICAL DATA:  Hip replacement, periprosthetic fracture suspected. History of fall several weeks ago. EXAM: CT OF THE LEFT HIP WITHOUT CONTRAST TECHNIQUE: Multidetector CT imaging of the left hip was performed according to the standard protocol. Multiplanar CT image reconstructions were also generated. RADIATION DOSE REDUCTION: This exam was performed according to the departmental dose-optimization program which includes automated exposure control, adjustment of the mA and/or kV according to patient size and/or use of iterative reconstruction technique. COMPARISON:  Left hip radiographs dated 04/25/2024. MRI of the left hip dated 03/10/2024. FINDINGS: Bones/Joint/Cartilage Status post left total hip arthroplasty with associated streak artifact, which limits detailed evaluation  of the surrounding bone and soft tissues. Longitudinal linear lucency extending approximately 4.5 cm along the anterior medial cortex of the left proximal femoral diaphysis, adjacent to the proximal stem of the femoral prosthesis (series 5, images 56-69 and series 7, images 64-66), concerning for nondisplaced periprosthetic fracture. Left hip arthroplasty is anatomically aligned with intact hardware. Visualized left sacroiliac joint and pubic symphysis are anatomically aligned with mild degenerative arthropathy. Ligaments Ligaments are suboptimally evaluated by CT. Muscles and Tendons No appreciable acute abnormality. Soft tissue Postsurgical changes and soft tissue swelling along the left lateral hip. No loculated fluid collection. Other: Sigmoid colonic diverticulosis. No enlarged lymph nodes identified in the field of view. IMPRESSION: Status post left total hip arthroplasty with longitudinal linear lucency extending along the anterior medial cortex of the left proximal femoral diaphysis adjacent to the  proximal stem of the femoral prosthesis, concerning for nondisplaced periprosthetic fracture. Electronically Signed   By: Harrietta Sherry M.D.   On: 04/25/2024 11:20   DG Hip Unilat W or Wo Pelvis 1 View Left Result Date: 04/25/2024 CLINICAL DATA:  Left hip pain after fall several weeks ago. EXAM: DG HIP (WITH OR WITHOUT PELVIS) 1V*L* COMPARISON:  None Available. FINDINGS: Status post left total hip arthroplasty. No fracture or dislocation is noted. IMPRESSION: No acute abnormality seen. Electronically Signed   By: Lynwood Landy Raddle M.D.   On: 04/25/2024 09:47   DG Pelvis 1-2 Views Result Date: 04/25/2024 CLINICAL DATA:  Left hip pain after fall. EXAM: PELVIS - 1-2 VIEW COMPARISON:  None Available. FINDINGS: There is no evidence of pelvic fracture or diastasis. No pelvic bone lesions are seen. Status post left hip arthroplasty. IMPRESSION: No acute abnormality seen. Electronically Signed   By: Lynwood Landy Raddle  M.D.   On: 04/25/2024 09:46    Disposition: Discharge disposition: 01-Home or Self Care       Discharge Instructions     Call MD / Call 911   Complete by: As directed    If you experience chest pain or shortness of breath, CALL 911 and be transported to the hospital emergency room.  If you develope a fever above 101 F, pus (white drainage) or increased drainage or redness at the wound, or calf pain, call your surgeon's office.   Constipation Prevention   Complete by: As directed    Drink plenty of fluids.  Prune juice may be helpful.  You may use a stool softener, such as Colace (over the counter) 100 mg twice a day.  Use MiraLax (over the counter) for constipation as needed.   Diet - low sodium heart healthy   Complete by: As directed    Increase activity slowly as tolerated   Complete by: As directed    Post-operative opioid taper instructions:   Complete by: As directed    POST-OPERATIVE OPIOID TAPER INSTRUCTIONS: It is important to wean off of your opioid medication as soon as possible. If you do not need pain medication after your surgery it is ok to stop day one. Opioids include: Codeine, Hydrocodone(Norco, Vicodin), Oxycodone (Percocet, oxycontin ) and hydromorphone  amongst others.  Long term and even short term use of opiods can cause: Increased pain response Dependence Constipation Depression Respiratory depression And more.  Withdrawal symptoms can include Flu like symptoms Nausea, vomiting And more Techniques to manage these symptoms Hydrate well Eat regular healthy meals Stay active Use relaxation techniques(deep breathing, meditating, yoga) Do Not substitute Alcohol to help with tapering If you have been on opioids for less than two weeks and do not have pain than it is ok to stop all together.  Plan to wean off of opioids This plan should start within one week post op of your joint replacement. Maintain the same interval or time between taking each dose and  first decrease the dose.  Cut the total daily intake of opioids by one tablet each day Next start to increase the time between doses. The last dose that should be eliminated is the evening dose.      Walker standard   Complete by: As directed    Wheelchair   Complete by: As directed         Contact information for follow-up providers     Edna Toribio LABOR, MD Follow up in 1 week(s).   Specialty: Orthopedic Surgery Contact information: [address]  [address] [place] [address] [phone]              Contact information for after-discharge care     Home Medical Care     [facility]) .   Service: Home Health Services Contact information: [address] [place]  [address] [phone]                      Discharge Instructions   None     Signed: TORIBIO LABOR Leann Mayweather 04/27/2024, 5:25 AM

## 2024-04-27 NOTE — Progress Notes (Signed)
 [facility] Transitional Care Management     Situation   Megan Singleton is a 73 y.o. female who was contacted today for a transitional care outreach.  Admission Date: 04/25/24  Discharge Date: 04/26/24   Institution: [facility]  Diagnosis:  Periprosthetic fracture around internal prosthetic left hip joint, initial encounter   Is this visit eligible for TCM? Yes  Background   Since Discharge: the patient reports she is on bedrest right now. She reports she is in pain--but has medications to take to help. She reports PT came out today to see her. She reports she has not received her wheelchair yet--but she did not want me to follow up on this right now. She reports she will call me back if she needs further assistance and reports her husband is working on the situation. She reports she has all her current medications at home. She reports her son and husband are helping her at home. She denies any issues with transportation or food insecurity. She reports she has had a couple recent falls before her hospital visit. She reports she will call PCP back later about setting up a visit with her. She did not want me to set up an appointment at this time with PCP. She denies any other needs right now and reports she is doing okay.   Primary Care Provider on Record: Tammy Rachel Brought, MD   Assessment    General Assessment     Type of Visit Telephone   Assessment Completed With Patient   Interpreter Used No   Preferred Language English   Living Arrangement Family members   Support System Family; Children; Spouse   Home Care Services Yes   Does Patient Have DME Yes   DME List Wheelchair   Does Patient Need DME Yes   DME Needs Wheelchair * pt reports she will call me back if she doesn't recieve it soon   Bed or Wheelchair Confined Yes   Fall History Yes      Flowsheet Row Most Recent Value  Engagement   Call number 3  Two calls made/Contact successful Yes  Is the  patient eligible? Yes  HIPAA Verified Yes  Medications   Medications reviewed with patient/caregiver? Yes  Is the patient having any side effects they believe may be caused by any medication additions or changes? No  Does the patient have all medications ordered at discharge? Yes  Nursing Interventions No intervention needed  Appointments   Does the patient have a primary care provider? Yes  Does the patient have an appointment with their PCP within 7 days of discharge? No  What is preventing the patient from scheduling follow up appointments within 7 days of discharge? -- * [pt does not want to set up a visit with PCP at this time due to mobility issues]  Does the patient have reliable transportation to and from appointments Yes  Self Management   What is the [facility] health agency? [facility]  Has home health visited the patient within 72 hours of discharge? Yes  What Durable Medical Equipment (DME) was ordered? Wheelchair  Has all Horticulturist, commercial (DME) been delivered? No  DME Interventions -- * [pt reports she will call me back if she doesn't get it soon--she did not want me to call about it at this time]  Patient Teaching   Wrap Up   Two calls made/Contact successful Yes      Recommendation    PCP/specialist notified: Yes  Referral Made:  No  Referrals made to other disciplines: None   Future Appointments  Date Time Provider Department Center  08/18/2024  2:30 PM [address] [place], PA-C [facility] [place] [facility] [address]  10/19/2024 10:00 AM Tammy Rachel Brought, MD [facility] [phone] [address]        Electronically signed by: Laymon JINNY Bring, RN 04/27/2024 3:40 PM    *Some images could not be shown.

## 2024-04-28 NOTE — Progress Notes (Signed)
 Okay, thank you!

## 2024-05-02 ENCOUNTER — Inpatient Hospital Stay (HOSPITAL_COMMUNITY)
Admission: AD | Admit: 2024-05-02 | Discharge: 2024-05-08 | DRG: 467 | Disposition: A | Discharge status: Home / Self Care | Source: Ambulatory Visit | Attending: Internal Medicine | Admitting: Internal Medicine

## 2024-05-02 ENCOUNTER — Other Ambulatory Visit: Payer: Self-pay

## 2024-05-02 ENCOUNTER — Encounter (HOSPITAL_COMMUNITY): Payer: Self-pay

## 2024-05-02 ENCOUNTER — Encounter (HOSPITAL_COMMUNITY): Payer: Self-pay | Admitting: Orthopedic Surgery

## 2024-05-02 DIAGNOSIS — K219 Gastro-esophageal reflux disease without esophagitis: Secondary | ICD-10-CM | POA: Diagnosis present

## 2024-05-02 DIAGNOSIS — G9340 Encephalopathy, unspecified: Secondary | ICD-10-CM | POA: Diagnosis present

## 2024-05-02 DIAGNOSIS — Z79899 Other long term (current) drug therapy: Secondary | ICD-10-CM | POA: Diagnosis not present

## 2024-05-02 DIAGNOSIS — Z23 Encounter for immunization: Secondary | ICD-10-CM | POA: Diagnosis not present

## 2024-05-02 DIAGNOSIS — F1090 Alcohol use, unspecified, uncomplicated: Secondary | ICD-10-CM | POA: Diagnosis present

## 2024-05-02 DIAGNOSIS — Z885 Allergy status to narcotic agent status: Secondary | ICD-10-CM

## 2024-05-02 DIAGNOSIS — D638 Anemia in other chronic diseases classified elsewhere: Secondary | ICD-10-CM | POA: Diagnosis present

## 2024-05-02 DIAGNOSIS — G40909 Epilepsy, unspecified, not intractable, without status epilepticus: Secondary | ICD-10-CM | POA: Diagnosis present

## 2024-05-02 DIAGNOSIS — Z888 Allergy status to other drugs, medicaments and biological substances status: Secondary | ICD-10-CM | POA: Diagnosis not present

## 2024-05-02 DIAGNOSIS — S7292XA Unspecified fracture of left femur, initial encounter for closed fracture: Secondary | ICD-10-CM | POA: Diagnosis present

## 2024-05-02 DIAGNOSIS — E785 Hyperlipidemia, unspecified: Secondary | ICD-10-CM | POA: Diagnosis present

## 2024-05-02 DIAGNOSIS — Z87891 Personal history of nicotine dependence: Secondary | ICD-10-CM

## 2024-05-02 DIAGNOSIS — F411 Generalized anxiety disorder: Secondary | ICD-10-CM | POA: Diagnosis present

## 2024-05-02 DIAGNOSIS — Z7982 Long term (current) use of aspirin: Secondary | ICD-10-CM | POA: Diagnosis not present

## 2024-05-02 DIAGNOSIS — Z818 Family history of other mental and behavioral disorders: Secondary | ICD-10-CM

## 2024-05-02 DIAGNOSIS — F319 Bipolar disorder, unspecified: Secondary | ICD-10-CM | POA: Diagnosis present

## 2024-05-02 DIAGNOSIS — R569 Unspecified convulsions: Secondary | ICD-10-CM | POA: Diagnosis not present

## 2024-05-02 DIAGNOSIS — Z604 Social exclusion and rejection: Secondary | ICD-10-CM | POA: Diagnosis present

## 2024-05-02 DIAGNOSIS — D72829 Elevated white blood cell count, unspecified: Secondary | ICD-10-CM | POA: Diagnosis present

## 2024-05-02 DIAGNOSIS — M797 Fibromyalgia: Secondary | ICD-10-CM | POA: Diagnosis present

## 2024-05-02 DIAGNOSIS — M978XXA Periprosthetic fracture around other internal prosthetic joint, initial encounter: Secondary | ICD-10-CM | POA: Diagnosis not present

## 2024-05-02 DIAGNOSIS — I10 Essential (primary) hypertension: Secondary | ICD-10-CM | POA: Diagnosis not present

## 2024-05-02 DIAGNOSIS — Z79890 Hormone replacement therapy (postmenopausal): Secondary | ICD-10-CM | POA: Diagnosis not present

## 2024-05-02 DIAGNOSIS — M9702XA Periprosthetic fracture around internal prosthetic left hip joint, initial encounter: Principal | ICD-10-CM | POA: Diagnosis present

## 2024-05-02 DIAGNOSIS — R4182 Altered mental status, unspecified: Secondary | ICD-10-CM | POA: Diagnosis not present

## 2024-05-02 DIAGNOSIS — G9349 Other encephalopathy: Secondary | ICD-10-CM | POA: Diagnosis not present

## 2024-05-02 DIAGNOSIS — G629 Polyneuropathy, unspecified: Secondary | ICD-10-CM | POA: Diagnosis present

## 2024-05-02 DIAGNOSIS — W1830XA Fall on same level, unspecified, initial encounter: Secondary | ICD-10-CM | POA: Diagnosis present

## 2024-05-02 DIAGNOSIS — I2510 Atherosclerotic heart disease of native coronary artery without angina pectoris: Secondary | ICD-10-CM

## 2024-05-02 DIAGNOSIS — M25552 Pain in left hip: Secondary | ICD-10-CM | POA: Diagnosis present

## 2024-05-02 DIAGNOSIS — G2581 Restless legs syndrome: Secondary | ICD-10-CM | POA: Diagnosis present

## 2024-05-02 DIAGNOSIS — E039 Hypothyroidism, unspecified: Secondary | ICD-10-CM | POA: Diagnosis not present

## 2024-05-02 LAB — SURGICAL PCR SCREEN
MRSA, PCR: NEGATIVE
Staphylococcus aureus: NEGATIVE

## 2024-05-02 MED ORDER — ONDANSETRON HCL 4 MG PO TABS
4.0000 mg | ORAL_TABLET | Freq: Four times a day (QID) | ORAL | Status: DC | PRN
Start: 2024-05-02 — End: 2024-05-08

## 2024-05-02 MED ORDER — OXYCODONE HCL 5 MG PO TABS
5.0000 mg | ORAL_TABLET | ORAL | Status: DC | PRN
  Administered 2024-05-02 – 2024-05-06 (×4): 10 mg via ORAL
  Administered 2024-05-06: 5 mg via ORAL
  Administered 2024-05-07: 10 mg via ORAL
  Filled 2024-05-02 (×4): qty 2
  Filled 2024-05-02: qty 1
  Filled 2024-05-02 (×4): qty 2

## 2024-05-02 MED ORDER — ACETAMINOPHEN 325 MG PO TABS
325.0000 mg | ORAL_TABLET | Freq: Four times a day (QID) | ORAL | Status: DC | PRN
  Administered 2024-05-05 – 2024-05-08 (×5): 650 mg via ORAL
  Filled 2024-05-02 (×5): qty 2

## 2024-05-02 MED ORDER — DIPHENHYDRAMINE HCL 12.5 MG/5ML PO ELIX
12.5000 mg | ORAL_SOLUTION | ORAL | Status: DC | PRN
  Administered 2024-05-03: 25 mg via ORAL
  Filled 2024-05-02: qty 10

## 2024-05-02 MED ORDER — METHOCARBAMOL 500 MG PO TABS
500.0000 mg | ORAL_TABLET | Freq: Four times a day (QID) | ORAL | Status: DC | PRN
  Administered 2024-05-02 – 2024-05-06 (×6): 500 mg via ORAL
  Filled 2024-05-02 (×6): qty 1

## 2024-05-02 MED ORDER — HYDROMORPHONE HCL 1 MG/ML IJ SOLN
0.5000 mg | INTRAMUSCULAR | Status: DC | PRN
  Administered 2024-05-03: 1 mg via INTRAVENOUS
  Filled 2024-05-02: qty 1

## 2024-05-02 MED ORDER — LEVETIRACETAM 500 MG PO TABS
750.0000 mg | ORAL_TABLET | Freq: Two times a day (BID) | ORAL | Status: DC
  Administered 2024-05-02 – 2024-05-04 (×4): 750 mg via ORAL
  Filled 2024-05-02 (×4): qty 1

## 2024-05-02 MED ORDER — SERTRALINE HCL 100 MG PO TABS
100.0000 mg | ORAL_TABLET | Freq: Every day | ORAL | Status: DC
  Administered 2024-05-04: 100 mg via ORAL
  Filled 2024-05-02: qty 1

## 2024-05-02 MED ORDER — GABAPENTIN 300 MG PO CAPS
1800.0000 mg | ORAL_CAPSULE | Freq: Every day | ORAL | Status: DC
  Administered 2024-05-04: 1800 mg via ORAL
  Filled 2024-05-02: qty 6

## 2024-05-02 MED ORDER — BUSPIRONE HCL 10 MG PO TABS
10.0000 mg | ORAL_TABLET | Freq: Two times a day (BID) | ORAL | Status: DC
  Administered 2024-05-02 – 2024-05-04 (×3): 10 mg via ORAL
  Filled 2024-05-02 (×3): qty 1

## 2024-05-02 MED ORDER — KETOROLAC TROMETHAMINE 30 MG/ML IJ SOLN
15.0000 mg | Freq: Three times a day (TID) | INTRAMUSCULAR | Status: DC
Start: 2024-05-02 — End: 2024-05-05
  Administered 2024-05-02 – 2024-05-04 (×6): 15 mg via INTRAVENOUS
  Filled 2024-05-02 (×6): qty 1

## 2024-05-02 MED ORDER — ZOLPIDEM TARTRATE 5 MG PO TABS
5.0000 mg | ORAL_TABLET | Freq: Every evening | ORAL | Status: DC | PRN
  Administered 2024-05-02 – 2024-05-03 (×2): 5 mg via ORAL
  Filled 2024-05-02 (×2): qty 1

## 2024-05-02 MED ORDER — MIRABEGRON ER 25 MG PO TB24
50.0000 mg | ORAL_TABLET | Freq: Every day | ORAL | Status: DC
  Administered 2024-05-04 – 2024-05-08 (×4): 50 mg via ORAL
  Filled 2024-05-02 (×5): qty 2

## 2024-05-02 MED ORDER — POLYETHYLENE GLYCOL 3350 17 G PO PACK
17.0000 g | PACK | Freq: Every day | ORAL | Status: DC | PRN
Start: 2024-05-02 — End: 2024-05-08

## 2024-05-02 MED ORDER — LEVOTHYROXINE SODIUM 25 MCG PO TABS
125.0000 ug | ORAL_TABLET | Freq: Every day | ORAL | Status: DC
  Administered 2024-05-03 – 2024-05-08 (×6): 125 ug via ORAL
  Filled 2024-05-02 (×6): qty 1

## 2024-05-02 MED ORDER — ONDANSETRON HCL 4 MG/2ML IJ SOLN
4.0000 mg | Freq: Four times a day (QID) | INTRAMUSCULAR | Status: DC | PRN
  Administered 2024-05-03 – 2024-05-07 (×4): 4 mg via INTRAVENOUS
  Filled 2024-05-02 (×4): qty 2

## 2024-05-02 MED ORDER — METHOCARBAMOL 1000 MG/10ML IJ SOLN
500.0000 mg | Freq: Four times a day (QID) | INTRAMUSCULAR | Status: DC | PRN
  Administered 2024-05-03 – 2024-05-07 (×2): 500 mg via INTRAVENOUS
  Filled 2024-05-02 (×2): qty 10

## 2024-05-02 MED ORDER — ACETAMINOPHEN 500 MG PO TABS
1000.0000 mg | ORAL_TABLET | Freq: Four times a day (QID) | ORAL | Status: DC
  Administered 2024-05-02 (×2): 1000 mg via ORAL
  Filled 2024-05-02 (×2): qty 2

## 2024-05-02 MED ORDER — OXYCODONE HCL 5 MG PO TABS
10.0000 mg | ORAL_TABLET | ORAL | Status: DC | PRN
  Administered 2024-05-02 – 2024-05-04 (×3): 10 mg via ORAL
  Administered 2024-05-04: 15 mg via ORAL
  Administered 2024-05-05 – 2024-05-07 (×4): 10 mg via ORAL
  Administered 2024-05-08: 15 mg via ORAL
  Filled 2024-05-02 (×2): qty 2
  Filled 2024-05-02 (×2): qty 3
  Filled 2024-05-02 (×3): qty 2

## 2024-05-02 NOTE — H&P (Signed)
 ORTHOPAEDIC H&P   Chief Complaint: Left hip pain after total hip arthroplasty  HPI: Megan Singleton is a 73 y.o. female who  Underwent left total hip arthroplasty 3 weeks ago on 04/07/2024.  She presented to the hospital last week and was found to have a nondisplaced periprosthetic fracture.  She was able to get her pain under control and mobilized with therapy with weightbearing precautions and was discharged home the next day.  She presents today for another follow-up.  However, endorses worsening pain at home.  She does feel like when she stepped onto her legs since being home felt something shift in her left hip.  Pain has been more poorly controlled.  Denies any new falls or trauma.  Denies pain other joints or extremities.  Past Medical History:  Diagnosis Date   Alcohol abuse    Allergy    Anxiety    Depression    Fibromyalgia    GERD (gastroesophageal reflux disease)    Hashimoto's thyroiditis    Hyperlipidemia    IBS (irritable bowel syndrome)    Osteopenia    Prolonged QT interval    RLS (restless legs syndrome)    Seizure disorder (HCC)    she is on life long medication per Neuro and can not have driving privileges if she  stops meds   Seizures (HCC)    Thyroid  disease    Past Surgical History:  Procedure Laterality Date   BUNIONECTOMY     Right and Hammertoe   CESAREAN SECTION  8021,8009   x's 2   ESOPHAGEAL MANOMETRY N/A 12/09/2016   Procedure: ESOPHAGEAL MANOMETRY (EM);  Surgeon: Celestia Agent, MD;  Location: [facility];  Service: Endoscopy;  Laterality: N/A;   ESOPHAGOGASTRODUODENOSCOPY (EGD) WITH PROPOFOL  N/A 05/23/2020   Procedure: ESOPHAGOGASTRODUODENOSCOPY (EGD) WITH PROPOFOL  with Botox ;  Surgeon: Elicia Claw, MD;  Location: [facility];  Service: Gastroenterology;  Laterality: N/A;   FOOT SURGERY     Left--Neuroma, Bunion   LIPOSUCTION     abdominal   SUBMUCOSAL INJECTION  05/23/2020   Procedure: SUBMUCOSAL INJECTION;  Surgeon: Elicia Claw, MD;  Location: [facility];  Service: Gastroenterology;;   TONSILLECTOMY     as a child   WRIST SURGERY     Right wrist---Benign mass   Social History   Socioeconomic History   Marital status: Married    Spouse name: Not on file   Number of children: Not on file   Years of education: Not on file   Highest education level: Not on file  Occupational History   Not on file  Tobacco Use   Smoking status: Former    Current packs/day: 0.00    Types: Cigarettes    Quit date: 07/21/1979    Years since quitting: 44.8   Smokeless tobacco: Never  Vaping Use   Vaping status: Never Used  Substance and Sexual Activity   Alcohol use: Yes    Alcohol/week: 21.0 standard drinks of alcohol    Types: 21 Shots of liquor per week   Drug use: No   Sexual activity: Not on file  Other Topics Concern   Not on file  Social History Narrative   Not on file   Social Drivers of Health   Financial Resource Strain: Low Risk  (04/15/2021)   Received from [facility]   Overall Financial Resource Strain (CARDIA)    Difficulty of Paying Living Expenses: Not hard at all  Food Insecurity: No Food Insecurity (04/25/2024)   Hunger Vital Sign  Worried About Programme researcher, broadcasting/film/video in the Last Year: Never true    Ran Out of Food in the Last Year: Never true  Transportation Needs: No Transportation Needs (04/25/2024)   PRAPARE - Administrator, Civil Service (Medical): No    Lack of Transportation (Non-Medical): No  Physical Activity: Inactive (04/15/2021)   Received from [facility]   Exercise Vital Sign    On average, how many days per week do you engage in moderate to strenuous exercise (like a brisk walk)?: 1 day    On average, how many minutes do you engage in exercise at this level?: 0 min  Stress: Stress Concern Present (04/15/2021)   Received from [facility] of Occupational Health - Occupational Stress Questionnaire    Feeling of Stress : To some extent   Social Connections: Socially Isolated (04/25/2024)   Social Connection and Isolation Panel    Frequency of Communication with Friends and Family: Once a week    Frequency of Social Gatherings with Friends and Family: Never    Attends Religious Services: Never    Database administrator or Organizations: No    Attends Engineer, structural: Never    Marital Status: Married   Family History  Problem Relation Age of Onset   Breast cancer Mother    Hyperlipidemia Mother    Prostate cancer Father    Hyperlipidemia Father    Depression Father    Cancer Cousin        Colon Cancer   Diabetes Other        Maternal Grandparent   Kidney disease Other        Maternal Grandparent   Stroke Other        Grandparent   Heart disease Other        Grand Parent   Allergies  Allergen Reactions   Morphine Rash    Other reaction(s): Other (See Comments)   Haloperidol  And Related Other (See Comments)    Akathisia     Positive ROS: All other systems have been reviewed and were otherwise negative with the exception of those mentioned in the HPI and as above.  Physical Exam: General: Alert, no acute distress Cardiovascular: No pedal edema Respiratory: No cyanosis, no use of accessory musculature Skin: No lesions in the area of chief complaint Neurologic: Sensation intact distally Psychiatric: Patient is competent for consent with normal mood and affect  MUSCULOSKELETAL:  LLE well-healing lateral left hip incision   groin pain with log roll  No knee or ankle effusion  Sens DPN, SPN, TN intact  Motor EHL, ext, flex 5/5  DP 2+, PT 2+, No significant edema   IMAGING: X-rays in the office pelvis left hip 2 view demonstrate mildly displaced periprosthetic left femur fracture  Assessment: Periprosthetic left femur fracture  Plan: Unfortunately repeat x-rays today demonstrate some displacement of her previously nondisplaced periprosthetic left femur fracture.  Anticipate at this  point she would benefit from revision of her femoral component to a longer diaphyseal engaging stem and cerclage cables.  Will have her direct admitted to the hospital today with plan for surgery tomorrow morning.   TORIBIO DELENA HIGASHI, MD  Contact information:   [id number] 7am-5pm epic message Dr. HIGASHI, or call office for patient follow up: [phone] After hours and holidays please check [email or web address] for group call information for [facility]

## 2024-05-02 NOTE — Plan of Care (Signed)
   Problem: Activity: Goal: Risk for activity intolerance will decrease Outcome: Progressing   Problem: Nutrition: Goal: Adequate nutrition will be maintained Outcome: Progressing   Problem: Pain Managment: Goal: General experience of comfort will improve and/or be controlled Outcome: Progressing   Problem: Safety: Goal: Ability to remain free from injury will improve Outcome: Progressing

## 2024-05-03 ENCOUNTER — Inpatient Hospital Stay (HOSPITAL_COMMUNITY): Admitting: Certified Registered Nurse Anesthetist

## 2024-05-03 ENCOUNTER — Ambulatory Visit: Admit: 2024-05-03 | Admitting: Orthopedic Surgery

## 2024-05-03 ENCOUNTER — Inpatient Hospital Stay (HOSPITAL_COMMUNITY)

## 2024-05-03 ENCOUNTER — Encounter (HOSPITAL_COMMUNITY): Payer: Self-pay | Admitting: Orthopedic Surgery

## 2024-05-03 ENCOUNTER — Encounter (HOSPITAL_COMMUNITY): Admission: AD | Disposition: A | Payer: Self-pay | Source: Ambulatory Visit | Attending: Orthopedic Surgery

## 2024-05-03 DIAGNOSIS — M978XXA Periprosthetic fracture around other internal prosthetic joint, initial encounter: Secondary | ICD-10-CM | POA: Diagnosis not present

## 2024-05-03 DIAGNOSIS — R569 Unspecified convulsions: Secondary | ICD-10-CM | POA: Diagnosis not present

## 2024-05-03 DIAGNOSIS — E039 Hypothyroidism, unspecified: Secondary | ICD-10-CM

## 2024-05-03 HISTORY — PX: TOTAL HIP REVISION: SHX763

## 2024-05-03 LAB — BASIC METABOLIC PANEL WITH GFR
Anion gap: 12 (ref 5–15)
BUN: 16 mg/dL (ref 8–23)
CO2: 24 mmol/L (ref 22–32)
Calcium: 9.1 mg/dL (ref 8.9–10.3)
Chloride: 107 mmol/L (ref 98–111)
Creatinine, Ser: 0.5 mg/dL (ref 0.44–1.00)
GFR, Estimated: >60 mL/min (ref >60)
Glucose, Bld: 94 mg/dL (ref 70–99)
Potassium: 4 mmol/L (ref 3.5–5.1)
Sodium: 143 mmol/L (ref 135–145)

## 2024-05-03 LAB — CBC
HCT: 37.1 % (ref 36.0–46.0)
Hemoglobin: 12 g/dL (ref 12.0–15.0)
MCH: 30.8 pg (ref 26.0–34.0)
MCHC: 32.3 g/dL (ref 30.0–36.0)
MCV: 95.4 fL (ref 80.0–100.0)
Platelets: 301 K/uL (ref 150–400)
RBC: 3.89 MIL/uL (ref 3.87–5.11)
RDW: 13.2 % (ref 11.5–15.5)
WBC: 6.8 K/uL (ref 4.0–10.5)
nRBC: 0 % (ref 0.0–0.2)

## 2024-05-03 LAB — ABO/RH: ABO/RH(D): O POS

## 2024-05-03 LAB — TYPE AND SCREEN
ABO/RH(D): O POS
Antibody Screen: NEGATIVE

## 2024-05-03 SURGERY — TOTAL HIP REVISION
Anesthesia: General | Site: Hip | Laterality: Left

## 2024-05-03 MED ORDER — TRANEXAMIC ACID-NACL 1000-0.7 MG/100ML-% IV SOLN
1000.0000 mg | INTRAVENOUS | Status: AC
  Administered 2024-05-03: 1000 mg via INTRAVENOUS
  Filled 2024-05-03: qty 100

## 2024-05-03 MED ORDER — BUPIVACAINE LIPOSOME 1.3 % IJ SUSP
INTRAMUSCULAR | Status: AC
  Filled 2024-05-03: qty 20

## 2024-05-03 MED ORDER — ACETAMINOPHEN 10 MG/ML IV SOLN
INTRAVENOUS | Status: AC
  Filled 2024-05-03: qty 100

## 2024-05-03 MED ORDER — ROCURONIUM BROMIDE 10 MG/ML (PF) SYRINGE
PREFILLED_SYRINGE | INTRAVENOUS | Status: DC | PRN
  Administered 2024-05-03: 50 mg via INTRAVENOUS
  Administered 2024-05-03 (×2): 20 mg via INTRAVENOUS

## 2024-05-03 MED ORDER — ISOPROPYL ALCOHOL 70 % SOLN
Status: DC | PRN
  Administered 2024-05-03: 1 via TOPICAL

## 2024-05-03 MED ORDER — MIDAZOLAM HCL 5 MG/5ML IJ SOLN
INTRAMUSCULAR | Status: DC | PRN
Start: 2024-05-03 — End: 2024-05-03
  Administered 2024-05-03: 2 mg via INTRAVENOUS

## 2024-05-03 MED ORDER — FENTANYL CITRATE (PF) 50 MCG/ML IJ SOSY: 25.0000 ug | PREFILLED_SYRINGE | INTRAMUSCULAR | Status: DC | PRN

## 2024-05-03 MED ORDER — LABETALOL HCL 5 MG/ML IV SOLN
INTRAVENOUS | Status: AC
  Filled 2024-05-03: qty 4

## 2024-05-03 MED ORDER — EPHEDRINE SULFATE-NACL 50-0.9 MG/10ML-% IV SOSY
PREFILLED_SYRINGE | INTRAVENOUS | Status: DC | PRN
Start: 2024-05-03 — End: 2024-05-03
  Administered 2024-05-03: 10 mg via INTRAVENOUS
  Administered 2024-05-03 (×2): 5 mg via INTRAVENOUS
  Administered 2024-05-03: 10 mg via INTRAVENOUS
  Administered 2024-05-03 (×2): 5 mg via INTRAVENOUS

## 2024-05-03 MED ORDER — HYDROMORPHONE HCL 1 MG/ML IJ SOLN
INTRAMUSCULAR | Status: AC
  Filled 2024-05-03: qty 1

## 2024-05-03 MED ORDER — BUPIVACAINE-EPINEPHRINE (PF) 0.25% -1:200000 IJ SOLN
INTRAMUSCULAR | Status: AC
  Filled 2024-05-03: qty 30

## 2024-05-03 MED ORDER — SUGAMMADEX SODIUM 200 MG/2ML IV SOLN
INTRAVENOUS | Status: DC | PRN
  Administered 2024-05-03: 200 mg via INTRAVENOUS

## 2024-05-03 MED ORDER — CEFADROXIL 500 MG PO CAPS
500.0000 mg | ORAL_CAPSULE | Freq: Two times a day (BID) | ORAL | Status: DC
Start: 2024-05-06 — End: 2024-05-08
  Administered 2024-05-07 – 2024-05-08 (×3): 500 mg via ORAL
  Filled 2024-05-03 (×5): qty 1

## 2024-05-03 MED ORDER — ALBUMIN HUMAN 5 % IV SOLN
INTRAVENOUS | Status: AC
  Filled 2024-05-03: qty 250

## 2024-05-03 MED ORDER — CEFAZOLIN SODIUM-DEXTROSE 2-4 GM/100ML-% IV SOLN
2.0000 g | INTRAVENOUS | Status: AC
  Administered 2024-05-03: 2 g via INTRAVENOUS
  Filled 2024-05-03: qty 100

## 2024-05-03 MED ORDER — FENTANYL CITRATE (PF) 100 MCG/2ML IJ SOLN
INTRAMUSCULAR | Status: DC | PRN
  Administered 2024-05-03 (×4): 50 ug via INTRAVENOUS

## 2024-05-03 MED ORDER — WATER FOR IRRIGATION, STERILE IR SOLN
Status: DC | PRN
  Administered 2024-05-03: 2000 mL

## 2024-05-03 MED ORDER — HYDROMORPHONE HCL 2 MG/ML IJ SOLN
INTRAMUSCULAR | Status: AC
  Filled 2024-05-03: qty 1

## 2024-05-03 MED ORDER — ALBUMIN HUMAN 5 % IV SOLN: INTRAVENOUS | Status: DC | PRN

## 2024-05-03 MED ORDER — MIDAZOLAM HCL 2 MG/2ML IJ SOLN
INTRAMUSCULAR | Status: AC
  Filled 2024-05-03: qty 2

## 2024-05-03 MED ORDER — LABETALOL HCL 5 MG/ML IV SOLN
INTRAVENOUS | Status: DC | PRN
  Administered 2024-05-03: 2.5 mg via INTRAVENOUS

## 2024-05-03 MED ORDER — LIDOCAINE HCL (PF) 2 % IJ SOLN
INTRAMUSCULAR | Status: AC
  Filled 2024-05-03: qty 5

## 2024-05-03 MED ORDER — ACETAMINOPHEN 10 MG/ML IV SOLN
1000.0000 mg | Freq: Once | INTRAVENOUS | Status: AC
Start: 2024-05-03 — End: 2024-05-04
  Administered 2024-05-03: 1000 mg via INTRAVENOUS

## 2024-05-03 MED ORDER — CHLORHEXIDINE GLUCONATE 4 % EX SOLN
60.0000 mL | Freq: Once | CUTANEOUS | Status: AC
  Administered 2024-05-03: 4 via TOPICAL
  Filled 2024-05-03: qty 15

## 2024-05-03 MED ORDER — LACTATED RINGERS IV SOLN: INTRAVENOUS | Status: DC

## 2024-05-03 MED ORDER — POVIDONE-IODINE 10 % EX SWAB: 2.0000 | Freq: Once | CUTANEOUS | Status: DC

## 2024-05-03 MED ORDER — PROPOFOL 10 MG/ML IV BOLUS
INTRAVENOUS | Status: AC
  Filled 2024-05-03: qty 20

## 2024-05-03 MED ORDER — 0.9 % SODIUM CHLORIDE (POUR BTL) OPTIME
TOPICAL | Status: DC | PRN
  Administered 2024-05-03: 1000 mL

## 2024-05-03 MED ORDER — ROCURONIUM BROMIDE 10 MG/ML (PF) SYRINGE
PREFILLED_SYRINGE | INTRAVENOUS | Status: AC
Start: 2024-05-03 — End: 2024-05-03
  Filled 2024-05-03: qty 10

## 2024-05-03 MED ORDER — ONDANSETRON HCL 4 MG/2ML IJ SOLN
INTRAMUSCULAR | Status: AC
  Filled 2024-05-03: qty 2

## 2024-05-03 MED ORDER — OXYCODONE HCL 5 MG/5ML PO SOLN: 5.0000 mg | Freq: Once | ORAL | Status: DC | PRN

## 2024-05-03 MED ORDER — OXYCODONE HCL 5 MG PO TABS: 5.0000 mg | ORAL_TABLET | Freq: Once | ORAL | Status: DC | PRN

## 2024-05-03 MED ORDER — ACETAMINOPHEN 500 MG PO TABS
1000.0000 mg | ORAL_TABLET | Freq: Once | ORAL | Status: AC
  Administered 2024-05-03: 1000 mg via ORAL
  Filled 2024-05-03: qty 2

## 2024-05-03 MED ORDER — DEXAMETHASONE SOD PHOSPHATE PF 10 MG/ML IJ SOLN
INTRAMUSCULAR | Status: DC | PRN
  Administered 2024-05-03: 10 mg via INTRAVENOUS

## 2024-05-03 MED ORDER — KETAMINE HCL 50 MG/5ML IJ SOSY
PREFILLED_SYRINGE | INTRAMUSCULAR | Status: AC
  Filled 2024-05-03: qty 5

## 2024-05-03 MED ORDER — KETAMINE HCL 10 MG/ML IJ SOLN
INTRAMUSCULAR | Status: DC | PRN
  Administered 2024-05-03: 35 mg via INTRAVENOUS

## 2024-05-03 MED ORDER — FENTANYL CITRATE (PF) 100 MCG/2ML IJ SOLN
INTRAMUSCULAR | Status: AC
  Filled 2024-05-03: qty 2

## 2024-05-03 MED ORDER — CEFAZOLIN SODIUM-DEXTROSE 2-4 GM/100ML-% IV SOLN
2.0000 g | Freq: Three times a day (TID) | INTRAVENOUS | Status: AC
  Administered 2024-05-03 – 2024-05-06 (×9): 2 g via INTRAVENOUS
  Filled 2024-05-03 (×9): qty 100

## 2024-05-03 MED ORDER — SUGAMMADEX SODIUM 200 MG/2ML IV SOLN
INTRAVENOUS | Status: AC
  Filled 2024-05-03: qty 4

## 2024-05-03 MED ORDER — LIDOCAINE 2% (20 MG/ML) 5 ML SYRINGE
INTRAMUSCULAR | Status: DC | PRN
  Administered 2024-05-03: 100 mg via INTRAVENOUS

## 2024-05-03 MED ORDER — EPHEDRINE 5 MG/ML INJ
INTRAVENOUS | Status: AC
Start: 2024-05-03 — End: 2024-05-03
  Filled 2024-05-03: qty 5

## 2024-05-03 MED ORDER — HYDROMORPHONE HCL 1 MG/ML IJ SOLN
0.2500 mg | INTRAMUSCULAR | Status: DC | PRN
  Administered 2024-05-03 (×4): 0.5 mg via INTRAVENOUS

## 2024-05-03 MED ORDER — FENTANYL CITRATE (PF) 50 MCG/ML IJ SOSY
50.0000 ug | PREFILLED_SYRINGE | Freq: Once | INTRAMUSCULAR | Status: AC
  Administered 2024-05-03: 50 ug via INTRAVENOUS
  Filled 2024-05-03: qty 1

## 2024-05-03 MED ORDER — ONDANSETRON HCL 4 MG/2ML IJ SOLN
INTRAMUSCULAR | Status: DC | PRN
  Administered 2024-05-03: 4 mg via INTRAVENOUS

## 2024-05-03 MED ORDER — SODIUM CHLORIDE 0.9 % IR SOLN
Status: DC | PRN
  Administered 2024-05-03: 3000 mL

## 2024-05-03 MED ORDER — AMISULPRIDE (ANTIEMETIC) 5 MG/2ML IV SOLN
10.0000 mg | Freq: Once | INTRAVENOUS | Status: DC | PRN
Start: 2024-05-03 — End: 2024-05-03

## 2024-05-03 MED ORDER — HYDROMORPHONE HCL 1 MG/ML IJ SOLN
INTRAMUSCULAR | Status: DC | PRN
  Administered 2024-05-03 (×3): .5 mg via INTRAVENOUS

## 2024-05-03 MED ORDER — PROPOFOL 10 MG/ML IV BOLUS
INTRAVENOUS | Status: DC | PRN
  Administered 2024-05-03: 150 mg via INTRAVENOUS

## 2024-05-03 MED ORDER — ASPIRIN 81 MG PO TBEC
81.0000 mg | DELAYED_RELEASE_TABLET | Freq: Two times a day (BID) | ORAL | Status: DC
  Administered 2024-05-04 – 2024-05-08 (×7): 81 mg via ORAL
  Filled 2024-05-03 (×8): qty 1

## 2024-05-03 SURGICAL SUPPLY — 71 items
BAG COUNTER SPONGE SURGICOUNT (BAG) IMPLANT
BAG ZIPLOCK 12X15 (MISCELLANEOUS) ×1 IMPLANT
BLADE SAW SAG 25X90X1.19 (BLADE) IMPLANT
BLADE SAW SGTL 81X20 HD (BLADE) IMPLANT
BUR OVAL CARBIDE 4.0 (BURR) IMPLANT
CANISTER WOUND CARE 500ML ATS (WOUND CARE) IMPLANT
CHLORAPREP W/TINT 26 (MISCELLANEOUS) ×2 IMPLANT
CNTNR URN SCR LID CUP LEK RST (MISCELLANEOUS) ×4 IMPLANT
COVER SURGICAL LIGHT HANDLE (MISCELLANEOUS) ×1 IMPLANT
DERMABOND ADVANCED .7 DNX12 (GAUZE/BANDAGES/DRESSINGS) IMPLANT
DRAPE C-ARM 42X120 X-RAY (DRAPES) IMPLANT
DRAPE C-ARMOR (DRAPES) IMPLANT
DRAPE HIP W/POCKET STRL (MISCELLANEOUS) ×1 IMPLANT
DRAPE INCISE IOBAN 85X60 (DRAPES) ×1 IMPLANT
DRAPE POUCH INSTRU U-SHP 10X18 (DRAPES) ×1 IMPLANT
DRAPE SHEET LG 3/4 BI-LAMINATE (DRAPES) ×3 IMPLANT
DRAPE U-SHAPE 47X51 STRL (DRAPES) ×2 IMPLANT
DRESSING AQUACEL AG SP 3.5X10 (GAUZE/BANDAGES/DRESSINGS) IMPLANT
DRESSING PEEL AND PLAC PRVNA20 (GAUZE/BANDAGES/DRESSINGS) IMPLANT
DRSG AQUACEL AG ADV 3.5X10 (GAUZE/BANDAGES/DRESSINGS) IMPLANT
ELECT BLADE TIP CTD 4 INCH (ELECTRODE) ×1 IMPLANT
ELECT PT RETURN MONO 15F ADT (MISCELLANEOUS) ×1 IMPLANT
ELECT REM PT RETURN 15FT ADLT (MISCELLANEOUS) ×1 IMPLANT
FACESHIELD WRAPAROUND OR TEAM (MASK) ×1 IMPLANT
GAUZE SPONGE 4X4 12PLY STRL (GAUZE/BANDAGES/DRESSINGS) ×1 IMPLANT
GAUZE SPONGE 4X4 12PLY STRL LF (GAUZE/BANDAGES/DRESSINGS) ×1 IMPLANT
GLOVE BIO SURGEON STRL SZ 6.5 (GLOVE) ×2 IMPLANT
GLOVE BIOGEL PI IND STRL 6.5 (GLOVE) ×1 IMPLANT
GLOVE BIOGEL PI IND STRL 8 (GLOVE) ×1 IMPLANT
GLOVE SURG ORTHO 8.0 STRL STRW (GLOVE) ×2 IMPLANT
GOWN STRL REUS W/ TWL XL LVL3 (GOWN DISPOSABLE) ×2 IMPLANT
HEAD CERAMIC FEMORAL 36MM (Head) IMPLANT
HOLDER FOLEY CATH W/STRAP (MISCELLANEOUS) ×1 IMPLANT
HOOD PEEL AWAY T7 (MISCELLANEOUS) ×3 IMPLANT
KIT BASIN OR (CUSTOM PROCEDURE TRAY) ×1 IMPLANT
KIT DRSG PREVENA PLUS 7DAY 125 (MISCELLANEOUS) IMPLANT
KIT TURNOVER KIT A (KITS) ×1 IMPLANT
LAVAGE JET IRRISEPT WOUND (IRRIGATION / IRRIGATOR) IMPLANT
MANIFOLD NEPTUNE II (INSTRUMENTS) ×1 IMPLANT
MARKER SKIN DUAL TIP RULER LAB (MISCELLANEOUS) ×1 IMPLANT
NDL SAFETY ECLIPSE 18X1.5 (NEEDLE) IMPLANT
NS IRRIG 1000ML POUR BTL (IV SOLUTION) ×1 IMPLANT
PACK TOTAL JOINT (CUSTOM PROCEDURE TRAY) ×1 IMPLANT
PAD ARMBOARD POSITIONER FOAM (MISCELLANEOUS) ×1 IMPLANT
PENCIL SMOKE EVACUATOR (MISCELLANEOUS) ×1 IMPLANT
PROTECTOR NERVE ULNAR (MISCELLANEOUS) ×1 IMPLANT
RETRIEVER SUT HEWSON (MISCELLANEOUS) ×1 IMPLANT
SEALER BIPOLAR AQUA 6.0 (INSTRUMENTS) IMPLANT
SET HNDPC FAN SPRY TIP SCT (DISPOSABLE) ×1 IMPLANT
SLEEVE CABLE 2MM VT (Orthopedic Implant) IMPLANT
SOLUTION IRRIG SURGIPHOR (IV SOLUTION) IMPLANT
SOLUTION PRONTOSAN WOUND 350ML (IRRIGATION / IRRIGATOR) IMPLANT
SPIKE FLUID TRANSFER (MISCELLANEOUS) ×3 IMPLANT
SUCTION TUBE FRAZIER 12FR DISP (SUCTIONS) ×1 IMPLANT
SUT ETHIBOND #5 BRAIDED 30INL (SUTURE) ×1 IMPLANT
SUT ETHILON 3 0 PS 1 (SUTURE) IMPLANT
SUT MNCRL AB 3-0 PS2 18 (SUTURE) IMPLANT
SUT NYLON 3 0 (SUTURE) IMPLANT
SUT STRATAFIX 14 PDO 48 VLT (SUTURE) ×1 IMPLANT
SUT VIC AB 2-0 CT2 27 (SUTURE) ×2 IMPLANT
SUTURE STRATFX 0 PDS 27 VIOLET (SUTURE) ×1 IMPLANT
SYR 30ML LL (SYRINGE) IMPLANT
SYR 50ML LL SCALE MARK (SYRINGE) IMPLANT
SYSTEM MODULAR HIP 155X18MM (Hips) IMPLANT
SYSTEM REST MOD HIP SZ23+0 V40 (Hips) IMPLANT
TOWEL GREEN STERILE FF (TOWEL DISPOSABLE) ×1 IMPLANT
TOWEL OR 17X26 10 PK STRL BLUE (TOWEL DISPOSABLE) ×1 IMPLANT
TRAY FOLEY MTR SLVR 16FR STAT (SET/KITS/TRAYS/PACK) ×1 IMPLANT
TUBE SUCTION HIGH CAP CLEAR NV (SUCTIONS) ×1 IMPLANT
UNDERPAD 30X36 HEAVY ABSORB (UNDERPADS AND DIAPERS) ×1 IMPLANT
WATER STERILE IRR 1000ML POUR (IV SOLUTION) ×2 IMPLANT

## 2024-05-03 NOTE — Op Note (Signed)
 05/02/2024 - 05/03/2024  3:34 PM  PATIENT:  Megan Singleton   MRN: [id number]  PRE-OPERATIVE DIAGNOSIS: Left proximal femur periprosthetic fracture  POST-OPERATIVE DIAGNOSIS:  same  PROCEDURE:  Revision left hip femoral component for periprosthetic fracture  PREOPERATIVE INDICATIONS:    Megan Singleton is an 73 y.o. female who underwent total of arthroplasty 3 weeks ago.  She had a fall at home.  Has had progressively worsening pain.  Serial x-rays showed a periprosthetic fracture that on yesterday's x-rays had displaced.  Given the fracture displacement felt that the stem was unstable and would benefit from revision to a diaphyseal engaging stem with fracture repair.  The risks benefits and alternatives were discussed with the patient including but not limited to the risks of nonoperative treatment, versus surgical intervention including infection, bleeding, nerve injury, periprosthetic fracture, the need for revision surgery, dislocation, leg length discrepancy, blood clots, cardiopulmonary complications, morbidity, mortality, among others, and they were willing to proceed.     OPERATIVE REPORT     SURGEON:  Toribio Higashi, MD    ASSISTANT: Bernarda Mclean, PA-C, (Present throughout the entire procedure,  necessary for completion of procedure in a timely manner, assisting with retraction, instrumentation, and closure)     ANESTHESIA: General  ESTIMATED BLOOD LOSS: 500cc    COMPLICATIONS:  None.     UNIQUE ASPECTS OF THE CASE: Successful stabilization of the periprosthetic fracture.  Excellent fixation with diaphyseal engaging stem.  COMPONENTS:   Stryker restoration modular 18 x 155 mm diaphyseal engaging stem, 23+0 proximal body 36+0 ceramic head.  2.0 Dall-Miles cables x 2. Implant Name Type Inv. Item Serial No. Manufacturer Lot No. LRB No. Used Action  SLEEVE CABLE VT - [id number] Orthopedic Implant SLEEVE CABLE VT  [employer] [id number] Left 1  Implanted  SLEEVE CABLE VT - [id number] Orthopedic Implant SLEEVE CABLE VT  [employer] [id number] Left 1 Implanted  SYSTEM MODULAR HIP 155X18MM - [id number] Hips SYSTEM MODULAR HIP 155X18MM  [employer] [id number] Left 1 Implanted  SYSTEM REST MOD HIP SZ23+0 V40 - [id number] Hips SYSTEM REST MOD HIP SZ23+0 V40  [employer] [id number] Left 1 Implanted  HEAD CERAMIC FEMORAL - [id number] Head HEAD CERAMIC FEMORAL  [employer] [id number] Left 1 Implanted       PROCEDURE IN DETAIL:   The patient was met in the holding area and  identified.  The appropriate hip was identified and marked at the operative site.  The patient was then transported to the OR  and  placed under anesthesia.  At that point, the patient was  placed in the lateral decubitus position with the operative side up and  secured to the operating room table  and all bony prominences padded. A subaxillary role was also placed.    The operative lower extremity was prepped from the iliac crest to the distal leg.  Sterile draping was performed.  Preoperative antibiotics, 2 gm of ancef,1 gm of Tranexamic Acid, and 8 mg of Decadron  administered. Time out was performed prior to incision.      A routine posterolateral approach was utilized via sharp dissection utilizing the patient's old incision carried down to the subcutaneous tissue.  The incision was also extended distally to allow for fracture fixation.  Gross bleeders were Bovie coagulated.  The iliotibial band was identified and incised along the length of the skin incision through the glute max fascia.  Charnley retractor was placed with care to protect the sciatic nerve posteriorly.  With the hip internally rotated, a capsulotomy was then performed off the femoral insertion and also tagged with a #5 Ethibond.    The hip was then carefully dislocated.  A bone tamp was used to remove the old head ball.  Assuming that the stem was likely unstable  and did not have ingrowth yet we were able to insert the screw and guide into the shoulder and easily back slap and remove the old implant.  We then remove and excise scar tissue.  We irrigated with Irrisept irrigation.  We then turned our attention to fixation of the fracture.  Using cable passer this was able to pass 2 cerclage cables circumferentially around the femur 1 below the lesser and a second above the lesser trochanter.  The fracture could be felt and was felt to be well reduced.  Fluoroscopy also confirmed appropriate position of the cables as well as good reduction of the fracture.  At this point turned my attention to preparation the canal for the stem.  We started with by sequentially reaming for the 225 mm construct.  We reamed sequentially up to 18 mm which had excellent cortical fit.  Fluoroscopy confirmed appropriate position of the distal stem on AP and lateral x-rays.  The real stem was opened and impacted into place.  We then prepared the proximal body.  We reamed up to a 23 proximal body and used a +0 head ball.  This was placed in about 30 degrees of anteversion.  This had appropriate stability and restoration of leg lengths.  We then opened the real proximal body and secured it in place with a locking screw again and about 30 degrees of anteversion.    I again trialed and selected a 36+ 0mm ball. The hip was then reduced and taken through a range of motion. There was no impingement with full extension and 90 degrees external rotation.  The hip was stable at the position of sleep and with 90 degrees flexion and 80 degrees of internal rotation. Leg lengths were  again assessed and felt to be restored.  We then opened, and I impacted the real head ball into place.  The posterior capsule was then closed with #5 Ethibond.  The piriformis was repaired through the base of the abductor tendon using a [place] suture passer.  I then irrigated the hip copiously with dilute Betadine and with  normal saline pulse lavage. Periarticular injection was then performed with Exparel.   We repaired the fascia #1 barbed suture, followed by 0 barbed suture for the subcutaneous fat.  Skin was closed with 2-0 Vicryl and 3-0 Monocryl.  Dermabond and Aquacel dressing were applied. The patient was then awakened and returned to PACU in stable and satisfactory condition.  Leg lengths in the supine position were assessed and felt to be clinically equal. There were no complications.  Post op recs: WB: 50% partial weightbearing left lower extremity and posterior precautions x 6 weeks Abx: ancef Ancef in house and discharged on cefadroxil for extended antibiotic prophylaxis. Imaging: PACU pelvis Xray Dressing: Prevena wound VAC to keep intact for 1 week DVT prophylaxis: Aspirin  81BID starting POD1 Follow up: 2 weeks after surgery for a wound check with Dr. Edna at [facility]  Address: [address], [place], [place] [address]  Office Phone: [phone]   Toribio Edna, MD Orthopedic Surgeon

## 2024-05-03 NOTE — Transfer of Care (Signed)
 Immediate Anesthesia Transfer of Care Note  Patient: Megan Singleton  Procedure(s) Performed: TOTAL HIP REVISION (Left: Hip)  Patient Location: PACU  Anesthesia Type:General  Level of Consciousness: awake and alert   Airway & Oxygen Therapy: Patient Spontanous Breathing and Patient connected to nasal cannula oxygen  Post-op Assessment: Report given to RN and Post -op Vital signs reviewed and stable  Post vital signs: Reviewed and stable  Last Vitals:  Vitals Value Taken Time  BP 145/91 05/03/24 16:20  Temp 36.9 C 05/03/24 16:18  Pulse 109 05/03/24 16:23  Resp 13 05/03/24 16:23  SpO2 92 % 05/03/24 16:23  Vitals shown include unfiled device data.  Last Pain:  Vitals:   05/03/24 1254  TempSrc:   PainSc: 10-Worst pain ever         Complications: No notable events documented.

## 2024-05-03 NOTE — Progress Notes (Signed)
     Subjective:  Patient reports pain as moderate.   Toradol  has been helping for pain.  No issues overnight.  Discussed plan for surgery this afternoon.  Yesterday's total administered Morphine Milligram Equivalents: 30   Objective:   VITALS:   Vitals:   05/02/24 1642 05/02/24 1953 05/03/24 0111 05/03/24 0426  BP:  129/74 (!) 140/59 (!) 145/81  Pulse:  80 67 67  Resp:  17 16 16   Temp:  97.9 F (36.6 C) 98.1 F (36.7 C) (!) 97.5 F (36.4 C)  TempSrc:  Oral    SpO2:  97% 95% 95%  Weight: 67.7 kg     Height: 5' 4 (1.626 m)       Sensation intact distally Intact pulses distally Dorsiflexion/Plantar flexion intact Incision: no erythema or drainage Compartment soft   Lab Results  Component Value Date   WBC 6.8 05/03/2024   HGB 12.0 05/03/2024   HCT 37.1 05/03/2024   MCV 95.4 05/03/2024   PLT 301 05/03/2024   BMET    Component Value Date/Time   NA 143 05/03/2024 0720   K 4.0 05/03/2024 0720   CL 107 05/03/2024 0720   CO2 24 05/03/2024 0720   GLUCOSE 94 05/03/2024 0720   GLUCOSE 121 01/16/2009 0000   BUN 16 05/03/2024 0720   CREATININE 0.50 05/03/2024 0720   CALCIUM  9.1 05/03/2024 0720   GFRNONAA >60 05/03/2024 0720      Assessment/Plan: * Day of Surgery *   Principal Problem:   Periprosthetic fracture around internal prosthetic left hip joint, initial encounter (HCC)  Left periprosthetic femur fracture with stem subsidence  Given the displacement of her fracture noted in the office x-rays yesterday I do not think her implant in the femur is stable.  She would benefit from revision of the femoral component with repair of the periprosthetic fracture.  She is scheduled for surgery this afternoon.  Patient has been n.p.o. since midnight.  Labs are reassuring.  The risks benefits and alternatives were discussed with the patient including but not limited to the risks of nonoperative treatment, versus surgical intervention including infection, bleeding,  nerve injury, periprosthetic fracture, the need for revision surgery, dislocation, leg length discrepancy, blood clots, cardiopulmonary complications, morbidity, mortality, among others, and they were willing to proceed.   Consent was signed by myself and the patient.    Nashid Pellum A Andras Grunewald 05/03/2024, 8:31 AM   Toribio Higashi, MD  Contact information:   [phone] 7am-5pm epic message Dr. Higashi, or call office for patient follow up: [phone] After hours and holidays please check [email or web address] for group call information for [facility]

## 2024-05-03 NOTE — Anesthesia Procedure Notes (Signed)
 Procedure Name: Intubation Date/Time: 05/03/2024 1:42 PM  Performed by: Zulema Leita PARAS, CRNAPre-anesthesia Checklist: Patient identified, Emergency Drugs available, Suction available and Patient being monitored Patient Re-evaluated:Patient Re-evaluated prior to induction Oxygen Delivery Method: Circle system utilized Preoxygenation: Pre-oxygenation with 100% oxygen Induction Type: IV induction Ventilation: Mask ventilation without difficulty Laryngoscope Size: Mac and 4 Grade View: Grade I Tube type: Oral Tube size: 7.0 mm Number of attempts: 1 Airway Equipment and Method: Stylet Placement Confirmation: ETT inserted through vocal cords under direct vision, positive ETCO2 and breath sounds checked- equal and bilateral Secured at: 21 cm Tube secured with: Tape Dental Injury: Teeth and Oropharynx as per pre-operative assessment

## 2024-05-03 NOTE — Anesthesia Postprocedure Evaluation (Signed)
 Anesthesia Post Note  Patient: Megan Singleton  Procedure(s) Performed: TOTAL HIP REVISION (Left: Hip)     Patient location during evaluation: PACU Anesthesia Type: General Level of consciousness: awake and alert Pain management: pain level controlled Vital Signs Assessment: post-procedure vital signs reviewed and stable Respiratory status: spontaneous breathing, nonlabored ventilation and respiratory function stable Cardiovascular status: blood pressure returned to baseline and stable Postop Assessment: no apparent nausea or vomiting Anesthetic complications: no   No notable events documented.  Last Vitals:  Vitals:   05/03/24 1715 05/03/24 1730  BP: (!) 154/83 (!) 158/84  Pulse: (!) 105 (!) 105  Resp: 13 18  Temp: (!) 36.3 C (!) 36.4 C  SpO2: 96% 96%    Last Pain:  Vitals:   05/03/24 1730  TempSrc: Oral  PainSc:                  Butler Levander Pinal

## 2024-05-03 NOTE — Anesthesia Preprocedure Evaluation (Addendum)
 Anesthesia Evaluation  Patient identified by MRN, date of birth, ID band Patient awake    Reviewed: Allergy & Precautions, NPO status , Patient's Chart, lab work & pertinent test results  History of Anesthesia Complications Negative for: history of anesthetic complications  Airway Mallampati: III  TM Distance: >3 FB Neck ROM: Full    Dental  (+) Dental Advisory Given   Pulmonary neg shortness of breath, neg sleep apnea, neg COPD, neg recent URI, former smoker   Pulmonary exam normal breath sounds clear to auscultation       Cardiovascular (-) hypertension(-) angina (-) Past MI, (-) Cardiac Stents and (-) CABG + dysrhythmias (prolonged QT)  Rhythm:Regular Rate:Normal  HLD  TTE 06/24/2022: IMPRESSIONS    1. Left ventricular ejection fraction, by estimation, is 60 to 65%. The  left ventricle has normal function. The left ventricle has no regional  wall motion abnormalities. Left ventricular diastolic parameters were  normal.   2. Right ventricular systolic function is normal. The right ventricular  size is normal. Tricuspid regurgitation signal is inadequate for assessing  PA pressure.   3. The mitral valve is grossly normal. No evidence of mitral valve  regurgitation. No evidence of mitral stenosis.   4. The aortic valve is tricuspid. Aortic valve regurgitation is not  visualized. No aortic stenosis is present.   5. The inferior vena cava is normal in size with greater than 50%  respiratory variability, suggesting right atrial pressure of 3 mmHg.     Neuro/Psych Seizures - (on Keppra, did not receive it this morning, alerted preop nurse to give it), Well Controlled,  PSYCHIATRIC DISORDERS (panic attacks) Anxiety Depression    RLS    GI/Hepatic ,GERD  ,,(+)     substance abuse  alcohol use  Endo/Other  neg diabetesHypothyroidism    Renal/GU negative Renal ROS     Musculoskeletal  (+)  Fibromyalgia -Osteopenia     Abdominal   Peds  Hematology negative hematology ROS (+) Lab Results      Component                Value               Date                      WBC                      6.8                 05/03/2024                HGB                      12.0                05/03/2024                HCT                      37.1                05/03/2024                MCV                      95.4  05/03/2024                PLT                      301                 05/03/2024              Anesthesia Other Findings   Reproductive/Obstetrics                              Anesthesia Physical Anesthesia Plan  ASA: 3  Anesthesia Plan: General   Post-op Pain Management:    Induction: Intravenous  PONV Risk Score and Plan: 3 and Ondansetron , Dexamethasone  and Treatment may vary due to age or medical condition  Airway Management Planned: Oral ETT  Additional Equipment:   Intra-op Plan:   Post-operative Plan: Extubation in OR  Informed Consent: I have reviewed the patients History and Physical, chart, labs and discussed the procedure including the risks, benefits and alternatives for the proposed anesthesia with the patient or authorized representative who has indicated his/her understanding and acceptance.     Dental advisory given  Plan Discussed with: CRNA and Anesthesiologist  Anesthesia Plan Comments: (Risks of general anesthesia discussed including, but not limited to, sore throat, hoarse voice, chipped/damaged teeth, injury to vocal cords, nausea and vomiting, allergic reactions, lung infection, heart attack, stroke, and death. All questions answered. )         Anesthesia Quick Evaluation

## 2024-05-04 ENCOUNTER — Inpatient Hospital Stay (HOSPITAL_COMMUNITY)

## 2024-05-04 ENCOUNTER — Encounter (HOSPITAL_COMMUNITY): Payer: Self-pay | Admitting: Orthopedic Surgery

## 2024-05-04 DIAGNOSIS — M9702XA Periprosthetic fracture around internal prosthetic left hip joint, initial encounter: Secondary | ICD-10-CM

## 2024-05-04 LAB — CBC
HCT: 32 % — ABNORMAL LOW (ref 36.0–46.0)
Hemoglobin: 10 g/dL — ABNORMAL LOW (ref 12.0–15.0)
MCH: 30.7 pg (ref 26.0–34.0)
MCHC: 31.3 g/dL (ref 30.0–36.0)
MCV: 98.2 fL (ref 80.0–100.0)
Platelets: 300 K/uL (ref 150–400)
RBC: 3.26 MIL/uL — ABNORMAL LOW (ref 3.87–5.11)
RDW: 13.3 % (ref 11.5–15.5)
WBC: 11.4 K/uL — ABNORMAL HIGH (ref 4.0–10.5)
nRBC: 0 % (ref 0.0–0.2)

## 2024-05-04 LAB — BASIC METABOLIC PANEL WITH GFR
Anion gap: 9 (ref 5–15)
BUN: 11 mg/dL (ref 8–23)
CO2: 27 mmol/L (ref 22–32)
Calcium: 9.1 mg/dL (ref 8.9–10.3)
Chloride: 105 mmol/L (ref 98–111)
Creatinine, Ser: 0.69 mg/dL (ref 0.44–1.00)
GFR, Estimated: >60 mL/min (ref >60)
Glucose, Bld: 107 mg/dL — ABNORMAL HIGH (ref 70–99)
Potassium: 4.1 mmol/L (ref 3.5–5.1)
Sodium: 141 mmol/L (ref 135–145)

## 2024-05-04 LAB — GLUCOSE, CAPILLARY: Glucose-Capillary: 96 mg/dL (ref 70–99)

## 2024-05-04 MED ORDER — LORAZEPAM 2 MG/ML IJ SOLN: 1.0000 mg | INTRAMUSCULAR | Status: DC | PRN

## 2024-05-04 MED ORDER — THIAMINE MONONITRATE 100 MG PO TABS
100.0000 mg | ORAL_TABLET | Freq: Every day | ORAL | Status: DC
  Administered 2024-05-04 – 2024-05-08 (×4): 100 mg via ORAL
  Filled 2024-05-04 (×4): qty 1

## 2024-05-04 MED ORDER — ENOXAPARIN SODIUM 40 MG/0.4ML IJ SOSY
40.0000 mg | PREFILLED_SYRINGE | INTRAMUSCULAR | Status: DC
Start: 2024-05-05 — End: 2024-05-08
  Administered 2024-05-05 – 2024-05-08 (×4): 40 mg via SUBCUTANEOUS
  Filled 2024-05-04 (×4): qty 0.4

## 2024-05-04 MED ORDER — ADULT MULTIVITAMIN W/MINERALS CH
1.0000 | ORAL_TABLET | Freq: Every day | ORAL | Status: DC
  Administered 2024-05-04 – 2024-05-08 (×4): 1 via ORAL
  Filled 2024-05-04 (×4): qty 1

## 2024-05-04 MED ORDER — FOLIC ACID 1 MG PO TABS
1.0000 mg | ORAL_TABLET | Freq: Every day | ORAL | Status: DC
  Administered 2024-05-04 – 2024-05-08 (×4): 1 mg via ORAL
  Filled 2024-05-04 (×4): qty 1

## 2024-05-04 MED ORDER — THIAMINE HCL 100 MG/ML IJ SOLN
100.0000 mg | Freq: Every day | INTRAMUSCULAR | Status: DC
  Administered 2024-05-05: 100 mg via INTRAVENOUS
  Filled 2024-05-04 (×3): qty 2

## 2024-05-04 MED ORDER — KETOROLAC TROMETHAMINE 30 MG/ML IJ SOLN
15.0000 mg | Freq: Four times a day (QID) | INTRAMUSCULAR | Status: AC
  Administered 2024-05-05 (×2): 15 mg via INTRAVENOUS
  Filled 2024-05-04 (×2): qty 1

## 2024-05-04 MED ORDER — HYDROMORPHONE HCL 1 MG/ML IJ SOLN
0.5000 mg | INTRAMUSCULAR | Status: DC | PRN
  Administered 2024-05-07 – 2024-05-08 (×2): 0.5 mg via INTRAVENOUS
  Filled 2024-05-04 (×3): qty 0.5

## 2024-05-04 MED ORDER — LORAZEPAM 1 MG PO TABS
1.0000 mg | ORAL_TABLET | ORAL | Status: DC | PRN
  Administered 2024-05-04: 4 mg via ORAL
  Filled 2024-05-04: qty 4

## 2024-05-04 NOTE — Progress Notes (Signed)
 Patient husband made aware of the patient being transferred to 4th floor (1422) and acknowledged. Husband reports he has gotten all the patient belongings together. Patient currently asleep. Patient transferred to the 4th floor without difficulty and assisted the receiving nurse with getting the patient hooked back up to everything.

## 2024-05-04 NOTE — Progress Notes (Signed)
 Patient became agitated and aggressive with staff this morning and was showing signs of alcohol withdrawal. Notified Dr. Edna and orders for CIWA scale and ETOH withdrawal protocol placed. Patient's initial CIWA score was 24. Gave 4mg  of PO Ativan  per CIWA scale and notified rapid response nurse. Patient reassessed after 1 hour, CIWA score decreased to 19. After a a few hours patient became agitated and aggressive again and attempted to pull out IV. Husband believes Ativan  is the reason for the behavior and requested that we do not give her any more. Husband expressed that patient went to rehab before and was given ativan  which resulted in similar behaviors. Husband states that patient only has one drink per day. Patient expressed desire for alcohol multiple times today and told nurse that she just needs alcohol because this has been the hardest thing she has ever done. Dr. Edna and Bernarda Mclean, PA notified of patient's agitation and husband's refusal for patient to get any more Ativan .

## 2024-05-04 NOTE — Evaluation (Signed)
 Physical Therapy Evaluation Patient Details Name: Megan Singleton MRN: [id number] DOB: 1951-01-14 Today's Date: 05/04/2024  History of Present Illness  Pt s/p L THR rev 2* proximal femur periprosthetic fx.  Pt wtih hx of Sz, fibromyalgia, RLS, ETOH abuse and L THR 04/07/24  Clinical Impression  Pt admitted as above and presenting with functional mobility limitations 2* significant post op pain, decreased L LE strength/ROM, posterior THP, PWB, and difficulty following cues.  Pt hopes to progress to dc home with family assist and HHPT follow up.        If plan is discharge home, recommend the following: A lot of help with walking and/or transfers;A lot of help with bathing/dressing/bathroom;Assistance with cooking/housework;Assist for transportation;Help with stairs or ramp for entrance   Can travel by private vehicle        Equipment Recommendations None recommended by PT  Recommendations for Other Services  OT consult    Functional Status Assessment Patient has had a recent decline in their functional status and demonstrates the ability to make significant improvements in function in a reasonable and predictable amount of time.     Precautions / Restrictions Precautions Precautions: Fall;Posterior Hip Precaution Booklet Issued: Yes (comment) Restrictions Weight Bearing Restrictions Per Provider Order: Yes LLE Weight Bearing Per Provider Order: Partial weight bearing LLE Partial Weight Bearing Percentage or Pounds: 50      Mobility  Bed Mobility Overal bed mobility: Needs Assistance Bed Mobility: Supine to Sit     Supine to sit: Max assist, +2 for physical assistance, +2 for safety/equipment     General bed mobility comments: Increased time, extensive use of bed pad but pt unable to tolerate completion of move to EOB sitting and returned to supine with RN alerted to additional pain meds    Transfers   Equipment used: Rolling walker (2 wheels)                General transfer comment: Deferred 2* pain    Ambulation/Gait                  Stairs            Wheelchair Mobility     Tilt Bed    Modified Rankin (Stroke Patients Only)       Balance Overall balance assessment: History of Falls, Needs assistance                                           Pertinent Vitals/Pain Pain Assessment Pain Assessment: 0-10 Pain Score: 10-Worst pain ever Pain Location: L hip with movement Pain Descriptors / Indicators: Aching, Grimacing, Guarding, Moaning Pain Intervention(s): Limited activity within patient's tolerance, Monitored during session, RN gave pain meds during session    Home Living Family/patient expects to be discharged to:: Private residence Living Arrangements: Spouse/significant other;Children Available Help at Discharge: Family;Available 24 hours/day Type of Home: House Home Access: Stairs to enter Entrance Stairs-Rails: None Entrance Stairs-Number of Steps: 1 + 1   Home Layout: Two level;Able to live on main level with bedroom/bathroom Home Equipment: Rolling Walker (2 wheels);BSC/3in1 Additional Comments: spouse works, son is home and can assist prn; pt plans to stay on 1st level in dining room    Prior Function Prior Level of Function : Independent/Modified Independent             Mobility Comments: walked with RW; fell just prior  to admission       Extremity/Trunk Assessment   Upper Extremity Assessment Upper Extremity Assessment: Overall WFL for tasks assessed    Lower Extremity Assessment Lower Extremity Assessment: LLE deficits/detail LLE: Unable to fully assess due to pain    Cervical / Trunk Assessment Cervical / Trunk Assessment: Normal  Communication   Communication Communication: No apparent difficulties    Cognition Arousal: Alert Behavior During Therapy: Anxious   PT - Cognitive impairments: Problem solving, Safety/Judgement                          Following commands: Impaired Following commands impaired: Follows multi-step commands inconsistently     Cueing Cueing Techniques: Verbal cues, Gestural cues     General Comments      Exercises     Assessment/Plan    PT Assessment Patient needs continued PT services  PT Problem List Decreased activity tolerance;Decreased balance;Decreased mobility;Pain;Decreased knowledge of use of DME;Decreased knowledge of precautions       PT Treatment Interventions DME instruction;Gait training;Stair training;Functional mobility training;Therapeutic activities;Patient/family education    PT Goals (Current goals can be found in the Care Plan section)  Acute Rehab PT Goals Patient Stated Goal: to go home PT Goal Formulation: With patient Time For Goal Achievement: 05/10/24 Potential to Achieve Goals: Good    Frequency 7X/week     Co-evaluation               AM-PAC PT 6 Clicks Mobility  Outcome Measure Help needed turning from your back to your side while in a flat bed without using bedrails?: Total Help needed moving from lying on your back to sitting on the side of a flat bed without using bedrails?: Total Help needed moving to and from a bed to a chair (including a wheelchair)?: Total Help needed standing up from a chair using your arms (e.g., wheelchair or bedside chair)?: Total Help needed to walk in hospital room?: Total Help needed climbing 3-5 steps with a railing? : Total 6 Click Score: 6    End of Session Equipment Utilized During Treatment: Gait belt Activity Tolerance: Patient tolerated treatment well Patient left: in bed;with bed alarm set;with call bell/phone within reach Nurse Communication: Mobility status PT Visit Diagnosis: Pain;Difficulty in walking, not elsewhere classified (R26.2) Pain - Right/Left: Left Pain - part of body: Hip    Time: 1120-1130 PT Time Calculation (min) (ACUTE ONLY): 10 min   Charges:   PT Evaluation $PT Eval Moderate  Complexity: 1 Mod   PT General Charges $$ ACUTE PT VISIT: 1 Visit         [facility] PT Acute Rehabilitation Services Office [phone]   Neliah Cuyler 05/04/2024, 12:51 PM

## 2024-05-04 NOTE — TOC Initial Note (Signed)
 Transition of Care [facility]) - Initial/Assessment Note    Patient Details  Name: Megan Singleton MRN: [id number] Date of Birth: 02-20-1951  Transition of Care [facility]) CM/SW Contact:    NORMAN ASPEN, LCSW Phone Number: 05/04/2024, 11:51 AM  Clinical Narrative:                  Met with pt, spouse and children to review anticipated dc needs.  Pt engaged but appears irritable.  Allows spouse to answer questions.  Spouse reports plan is for pt to dc home and family can provide any needed assistance.  She has needed DME in the home.  Notes that pt has had HHPT in the past but unsure if will be ordered this time.  IP CM will continue to follow.  Note referral placed for [facility] and have placed resource information on AVS.  Expected Discharge Plan: Home w Home Health Services Barriers to Discharge: No Barriers Identified   Patient Goals and CMS Choice Patient states their goals for this hospitalization and ongoing recovery are:: return home          Expected Discharge Plan and Services In-house Referral: Clinical Social Work   Post Acute Care Choice: Home Health Living arrangements for the past 2 months: Single Family Home                 DME Arranged: N/A DME Agency: NA                  Prior Living Arrangements/Services Living arrangements for the past 2 months: Single Family Home Lives with:: Spouse Patient language and need for interpreter reviewed:: Yes Do you feel safe going back to the place where you live?: Yes      Need for Family Participation in Patient Care: Yes (Comment) Care giver support system in place?: Yes (comment)   Criminal Activity/Legal Involvement Pertinent to Current Situation/Hospitalization: No - Comment as needed  Activities of Daily Living   ADL Screening (condition at time of admission) Independently performs ADLs?: Yes (appropriate for developmental age) Is the patient deaf or have difficulty hearing?: No Does the patient have difficulty  seeing, even when wearing glasses/contacts?: No Does the patient have difficulty concentrating, remembering, or making decisions?: Yes  Permission Sought/Granted Permission sought to share information with : Family Supports Permission granted to share information with : Yes, Verbal Permission Granted  Share Information with NAME: spouse, Reni Hausner           Emotional Assessment Appearance:: Appears stated age Attitude/Demeanor/Rapport: Engaged Affect (typically observed): Frustrated Orientation: : Oriented to Self, Oriented to Place, Oriented to  Time, Oriented to Situation Alcohol / Substance Use: Alcohol Use Psych Involvement: No (comment)  Admission diagnosis:  Periprosthetic fracture around internal prosthetic left hip joint, initial encounter (HCC) [F02.02XA] Patient Active Problem List   Diagnosis Date Noted   Periprosthetic fracture around internal prosthetic left hip joint, initial encounter (HCC) 04/25/2024   Panic attacks 11/19/2014   Pap smear for cervical cancer screening 05/21/2014   Cough 09/27/2013   Thoracic back pain 08/18/2011   Conjunctivitis 07/16/2011   General medical examination 01/06/2011   DYSPAREUNIA 02/10/2010   VAGINITIS, ATROPHIC 02/10/2010   Pilonidal cyst 02/10/2010   PNEUMONIA 01/16/2010   INCONTINENCE, MIXED, URGE/STRESS 01/16/2010   CHEST PAIN UNSPECIFIED 10/04/2009   MALAISE AND FATIGUE 09/10/2009   DIARRHEA, CHRONIC 09/10/2009   Subjective visual disturbance 06/10/2009   GANGLION CYST, WRIST, LEFT 06/10/2009   Asymptomatic postmenopausal status 06/10/2009   PLANTAR FASCIITIS  12/13/2008   RESTLESS LEGS SYNDROME 09/05/2008   Impacted cerumen 07/23/2008   DEPRESSION 07/04/2008   Infective otitis externa 07/04/2008   Acute upper respiratory infection 07/04/2008   SWEATING 08/11/2007   Hyperlipidemia 08/02/2007   Alcohol abuse 08/02/2007   SINUSITIS- ACUTE-NOS 05/11/2007   Allergic rhinitis 05/11/2007   PAIN IN JOINT, HAND 03/09/2007    Hypothyroidism 12/15/2006   Hemorrhoids 12/15/2006   GERD 12/15/2006   Hematuria 12/15/2006   Disorder of bone and cartilage 12/15/2006   Seizure disorder (HCC) 12/15/2006   PCP:  Jolee Madelin Patch, MD Pharmacy:   [facility] [phone] [place], [place] - [address] AT [facility] OF [address] [place]  [address] Phone: [phone] Fax: [phone]     Social Drivers of Health (SDOH) Social History: SDOH Screenings   Food Insecurity: No Food Insecurity (05/02/2024)  Housing: Low Risk  (05/02/2024)  Transportation Needs: No Transportation Needs (05/02/2024)  Utilities: Not At Risk (05/02/2024)  Depression (PHQ2-9): Medium Risk (03/29/2021)  Financial Resource Strain: Low Risk  (04/15/2021)   Received from [facility]  Physical Activity: Inactive (04/15/2021)   Received [facility]  Social Connections: Socially Isolated (05/02/2024)  Stress: Stress Concern Present (04/15/2021)   Received from [facility]  Tobacco Use: Medium Risk (05/03/2024)   SDOH Interventions:     Readmission Risk Interventions    05/04/2024   11:47 AM  Readmission Risk Prevention Plan  Post Dischage Appt Complete  Medication Screening Complete  Transportation Screening Complete

## 2024-05-04 NOTE — Progress Notes (Signed)
     Subjective:  Patient reports pain as moderate.  Doing better than before surgery. Discussed intra-op findings. Husband at bedside this morning. Plan for mobilization with PT today.  Yesterday's total administered Morphine Milligram Equivalents: 195   Objective:   VITALS:   Vitals:   05/03/24 1730 05/03/24 1949 05/04/24 0134 05/04/24 0553  BP: (!) 158/84 (!) 156/84 (!) 141/73 129/69  Pulse: (!) 105 (!) 104 (!) 105 97  Resp: 18 18 17 16   Temp: (!) 97.5 F (36.4 C) 97.7 F (36.5 C) 98.7 F (37.1 C) 97.7 F (36.5 C)  TempSrc: Oral Oral Oral Oral  SpO2: 96% 94% 96% 98%  Weight:      Height:        Sensation intact distally Intact pulses distally Dorsiflexion/Plantar flexion intact Incision: dressing C/D/I Compartment soft    Lab Results  Component Value Date   WBC 6.8 05/03/2024   HGB 12.0 05/03/2024   HCT 37.1 05/03/2024   MCV 95.4 05/03/2024   PLT 301 05/03/2024   BMET    Component Value Date/Time   NA 143 05/03/2024 0720   K 4.0 05/03/2024 0720   CL 107 05/03/2024 0720   CO2 24 05/03/2024 0720   GLUCOSE 94 05/03/2024 0720   GLUCOSE 121 01/16/2009 0000   BUN 16 05/03/2024 0720   CREATININE 0.50 05/03/2024 0720   CALCIUM  9.1 05/03/2024 0720   GFRNONAA >60 05/03/2024 0720      Xray: THA revision femoral components good position no adverse features.  Assessment/Plan: 1 Day Post-Op   Principal Problem:   Periprosthetic fracture around internal prosthetic left hip joint, initial encounter (HCC)  S/p L THA revision femoral component for fracture 05/03/24  Post op recs: WB: 50% partial weightbearing left lower extremity and posterior precautions x 6 weeks Abx: ancef Ancef in house and discharged on cefadroxil for extended antibiotic prophylaxis. Imaging: PACU pelvis Xray Dressing: Prevena wound VAC to keep intact for 1 week DVT prophylaxis: Aspirin  81BID starting POD1 Follow up: 2 weeks after surgery for a wound check with Dr. Edna at  [facility].  Address: [address], [place], [place] [address]  Office Phone: [phone]    TORIBIO DELENA EDNA 05/04/2024, 7:49 AM   TORIBIO Edna, MD  Contact information:   [phone] 7am-5pm epic message Dr. Edna, or call office for patient follow up: [phone] After hours and holidays please check [email or web address] for group call information for [facility]

## 2024-05-04 NOTE — Progress Notes (Addendum)
 Activation time 2326  Called Telespecialist at 2337, neuro had not been assigned, told they were in process of assigning pt.  Called Telespecialist at 2344 and was told that pt was in the que, that there were 3 pts ahead of her, and that they would camera in shortly.  Neuro assigned, response time was 2353.

## 2024-05-04 NOTE — Progress Notes (Signed)
 Patient alert and confused. Patient c/o pain 8/10 with medication on board and drowsy. Patient medicated and re-evaluated with good results. Patient reports she needs pain medication every 2 hours. Patient advised of her pain medication regimen and reports she will speak to her doctor. Will continue to monitor the patient.

## 2024-05-04 NOTE — TOC Progression Note (Addendum)
 Transition of Care [facility]) - Progression Note    Patient Details  Name: Megan Singleton MRN: [id number] Date of Birth: 14-Oct-1950  Transition of Care [facility]) CM/SW Contact  Alfonse JONELLE Rex, RN Phone Number: 05/04/2024, 1:51 PM  Clinical Narrative:  PT eval completed, recommendation for [facility] PT.  Met with patient at bedside, patient agreeable, states she is currently receiving [facility] services. NCM confirmed patient receiving HH PT/OT with [facility], patient agreeable to continuation with [facility]. [facility], rep-[facility], accepted referral. Request sent to attending for [facility] PT/OT order.       Expected Discharge Plan: Home w Home Health Services Barriers to Discharge: No Barriers Identified               Expected Discharge Plan and Services In-house Referral: Clinical Social Work   Post Acute Care Choice: Home Health Living arrangements for the past 2 months: Single Family Home                 DME Arranged: N/A DME Agency: NA                   Social Drivers of Health (SDOH) Interventions SDOH Screenings   Food Insecurity: No Food Insecurity (05/02/2024)  Housing: Low Risk  (05/02/2024)  Transportation Needs: No Transportation Needs (05/02/2024)  Utilities: Not At Risk (05/02/2024)  Depression (PHQ2-9): Medium Risk (03/29/2021)  Financial Resource Strain: Low Risk  (04/15/2021)   Received from [facility]  Physical Activity: Inactive (04/15/2021)   Received from [facility]  Social Connections: Socially Isolated (05/02/2024)  Stress: Stress Concern Present (04/15/2021)   Received from [facility]  Tobacco Use: Medium Risk (05/03/2024)    Readmission Risk Interventions    05/04/2024   11:47 AM  Readmission Risk Prevention Plan  Post Dischage Appt Complete  Medication Screening Complete  Transportation Screening Complete

## 2024-05-04 NOTE — Progress Notes (Signed)
 Physical Therapy Treatment Patient Details Name: Megan Singleton MRN: [id number] DOB: August 18, 1950 Today's Date: 05/04/2024   History of Present Illness Pt s/p L THR rev 2* proximal femur periprosthetic fx.  Pt wtih hx of Sz, fibromyalgia, RLS, ETOH abuse and L THR 04/07/24    PT Comments  Pt very cooperative and with decreased anxiety but requiring increased time and significant assist for performance of all basic mobility tasks.  Pt reviewed THP, up to EOB sitting and performed step pvt to recliner with RW.  Further gait not attempted 2* pt report of increasing dizziness - BP 129/67.      If plan is discharge home, recommend the following: A lot of help with walking and/or transfers;A lot of help with bathing/dressing/bathroom;Assistance with cooking/housework;Assist for transportation;Help with stairs or ramp for entrance   Can travel by private vehicle        Equipment Recommendations  None recommended by PT    Recommendations for Other Services OT consult     Precautions / Restrictions Precautions Precautions: Fall;Posterior Hip Precaution Booklet Issued: Yes (comment) Recall of Precautions/Restrictions: Impaired Precaution/Restrictions Comments: reviewed all THP Restrictions Weight Bearing Restrictions Per Provider Order: Yes LLE Weight Bearing Per Provider Order: Partial weight bearing LLE Partial Weight Bearing Percentage or Pounds: 50     Mobility  Bed Mobility Overal bed mobility: Needs Assistance Bed Mobility: Supine to Sit     Supine to sit: Mod assist, +2 for physical assistance, +2 for safety/equipment, Used rails     General bed mobility comments: Increased time, cues for sequence and use of R LE to self asisst; physical assist to manage L LE, to control trunk and to complete rotation with use of bed pad    Transfers Overall transfer level: Needs assistance Equipment used: Rolling walker (2 wheels) Transfers: Sit to/from Stand, Bed to  chair/wheelchair/BSC Sit to Stand: Min assist, Mod assist, +2 safety/equipment, From elevated surface           General transfer comment: Cues for LE management, use of UEs to self assist, and adherence to THP.  Step pvt with RW bed to recliner    Ambulation/Gait Ambulation/Gait assistance: Min assist, Mod assist, +2 physical assistance, +2 safety/equipment       Gait velocity: decr     General Gait Details: Step pvt to recliner only - limited by c/o dizziness - BP 129/67   Stairs             Wheelchair Mobility     Tilt Bed    Modified Rankin (Stroke Patients Only)       Balance Overall balance assessment: History of Falls, Needs assistance Sitting-balance support: No upper extremity supported, Feet supported Sitting balance-Leahy Scale: Fair     Standing balance support: Bilateral upper extremity supported Standing balance-Leahy Scale: Poor                              Communication Communication Communication: No apparent difficulties  Cognition Arousal: Alert Behavior During Therapy: Anxious   PT - Cognitive impairments: Problem solving, Safety/Judgement                         Following commands: Impaired Following commands impaired: Follows multi-step commands inconsistently    Cueing Cueing Techniques: Verbal cues, Gestural cues  Exercises Total Joint Exercises Ankle Circles/Pumps: AROM, Both, 15 reps, Supine    General Comments  Pertinent Vitals/Pain Pain Assessment Pain Assessment: 0-10 Pain Score: 5  Pain Location: L hip with movement Pain Descriptors / Indicators: Aching, Sore Pain Intervention(s): Limited activity within patient's tolerance, Monitored during session, Premedicated before session, Ice applied    Home Living Family/patient expects to be discharged to:: Private residence Living Arrangements: Spouse/significant other;Children Available Help at Discharge: Family;Available 24  hours/day Type of Home: House Home Access: Stairs to enter Entrance Stairs-Rails: None Entrance Stairs-Number of Steps: 1 + 1   Home Layout: Two level;Able to live on main level with bedroom/bathroom Home Equipment: Rolling Walker (2 wheels);BSC/3in1 Additional Comments: spouse works, son is home and can assist prn; pt plans to stay on 1st level in dining room    Prior Function            PT Goals (current goals can now be found in the care plan section) Acute Rehab PT Goals Patient Stated Goal: to go home PT Goal Formulation: With patient Time For Goal Achievement: 05/10/24 Potential to Achieve Goals: Good Progress towards PT goals: Progressing toward goals    Frequency    7X/week      PT Plan      Co-evaluation              AM-PAC PT 6 Clicks Mobility   Outcome Measure  Help needed turning from your back to your side while in a flat bed without using bedrails?: Total Help needed moving from lying on your back to sitting on the side of a flat bed without using bedrails?: Total Help needed moving to and from a bed to a chair (including a wheelchair)?: Total Help needed standing up from a chair using your arms (e.g., wheelchair or bedside chair)?: Total Help needed to walk in hospital room?: Total Help needed climbing 3-5 steps with a railing? : Total 6 Click Score: 6    End of Session Equipment Utilized During Treatment: Gait belt Activity Tolerance: Patient tolerated treatment well;Patient limited by fatigue Patient left: in chair;with call bell/phone within reach;with chair alarm set;with family/visitor present Nurse Communication: Mobility status PT Visit Diagnosis: Pain;Difficulty in walking, not elsewhere classified (R26.2) Pain - Right/Left: Left Pain - part of body: Hip     Time: 8690-8665 PT Time Calculation (min) (ACUTE ONLY): 25 min  Charges:    $Therapeutic Activity: 23-37 mins PT General Charges $$ ACUTE PT VISIT: 1 Visit                      [facility] PT Acute Rehabilitation Services Office [phone]    Ajahni Nay 05/04/2024, 2:07 PM

## 2024-05-04 NOTE — Consult Note (Addendum)
 History and Physical    ALEXXIA STANKIEWICZ FMW:[id number] DOB: 1951-02-13 DOA: 05/02/2024  DOS: the patient was seen and examined on 05/02/2024  PCP: Jolee Madelin Patch, MD   I have personally briefly reviewed patient's old medical records in [facility] and CareEverywhere  HPI:   HAEVEN NICKLE is a 73 y.o. year old female with past medical history of hypothyroidism, hyperlipidemia restless leg syndrome, seizure disorder, bipolar disorder, osteoarthritis with recent left hip arthroplasty who developed periprosthetic fracture and was admitted by the orthopedic surgery team for repair on 10/14 for repeat surgical intervention.  TRH consulted by orthopedic surgery for altered mental status.  There concern was alcohol withdrawal.  Per family patient drinks 1 drink a day and drinks about 10 out of the 14 days and even 2 weeks.  They reported she was long periods without drinking and does not withdrawal.  She has bipolar disorder and does have hallucinations from time to time.  Spoke with the patient who was anxious and moving in the bed trying to get out and did not provide good history to what is going on.  She she is not having any pain currently.  I was unable to perform complete review of system given lack of cooperation.   Review of Systems: unable to review all systems due to the inability of the patient to answer questions.   Past Medical History:  Diagnosis Date   Alcohol abuse    Allergy    Anxiety    Depression    Fibromyalgia    GERD (gastroesophageal reflux disease)    Hashimoto's thyroiditis    Hyperlipidemia    IBS (irritable bowel syndrome)    Osteopenia    Prolonged QT interval    RLS (restless legs syndrome)    Seizure disorder (HCC)    she is on life long medication per Neuro and can not have driving privileges if she  stops meds   Seizures (HCC)    Thyroid  disease     Past Surgical History:  Procedure Laterality Date   BUNIONECTOMY     Right  and Hammertoe   CESAREAN SECTION  8021,8009   x's 2   ESOPHAGEAL MANOMETRY N/A 12/09/2016   Procedure: ESOPHAGEAL MANOMETRY (EM);  Surgeon: Celestia Agent, MD;  Location: [facility];  Service: Endoscopy;  Laterality: N/A;   ESOPHAGOGASTRODUODENOSCOPY (EGD) WITH PROPOFOL  N/A 05/23/2020   Procedure: ESOPHAGOGASTRODUODENOSCOPY (EGD) WITH PROPOFOL  with Botox ;  Surgeon: Elicia Claw, MD;  Location: [facility];  Service: Gastroenterology;  Laterality: N/A;   FOOT SURGERY     Left--Neuroma, Bunion   LIPOSUCTION     abdominal   SUBMUCOSAL INJECTION  05/23/2020   Procedure: SUBMUCOSAL INJECTION;  Surgeon: Elicia Claw, MD;  Location: [facility];  Service: Gastroenterology;;   TONSILLECTOMY     as a child   TOTAL HIP REVISION Left 05/03/2024   Procedure: TOTAL HIP REVISION;  Surgeon: Edna Toribio LABOR, MD;  Location: [facility];  Service: Orthopedics;  Laterality: Left;  FEMORAL COMPONENT STRYKER NOTIFIED   WRIST SURGERY     Right wrist---Benign mass     Allergies  Allergen Reactions   Morphine Rash   Haloperidol  And Related Other (See Comments)    Akathisia/motor restlessness   Milk-Related Compounds Other (See Comments)    Just gas    Family History  Problem Relation Age of Onset   Breast cancer Mother    Hyperlipidemia Mother    Prostate cancer Father    Hyperlipidemia Father  Depression Father    Cancer Cousin        Colon Cancer   Diabetes Other        Maternal Grandparent   Kidney disease Other        Maternal Grandparent   Stroke Other        Grandparent   Heart disease Other        Grand Parent    Prior to Admission medications   Medication Sig Start Date End Date Taking? Authorizing Provider  ADVIL PM 200-38 MG TABS Take 2 tablets by mouth at bedtime.   Yes [provider]  aspirin  81 MG chewable tablet Chew 81 mg by mouth See admin instructions. Chew/swallow 81 mg by mouth in the morning and evening   Yes [provider]   busPIRone (BUSPAR) 10 MG tablet Take 10 mg by mouth in the morning and at bedtime.   Yes [provider]  cyanocobalamin (VITAMIN B12) 1000 MCG tablet Take 1,000 mcg by mouth in the morning.   Yes [provider]  gabapentin (NEURONTIN) 600 MG tablet Take 600 mg by mouth in the morning, at noon, and at bedtime.   Yes [provider]  levETIRAcetam (KEPPRA) 750 MG tablet Take 750 mg by mouth in the morning and at bedtime.   Yes [provider]  levothyroxine  (SYNTHROID ) 125 MCG tablet Take 125 mcg by mouth daily before breakfast.   Yes [provider]  methocarbamol (ROBAXIN) 500 MG tablet Take 500 mg by mouth every 6 (six) hours as needed for muscle spasms. 04/20/24  Yes [provider]  MYRBETRIQ 50 MG TB24 tablet Take 50 mg by mouth in the morning.   Yes [provider]  pramipexole  (MIRAPEX ) 1.5 MG tablet Take 1.5-3 mg by mouth See admin instructions. Take 1.5 mg by mouth in the morning and 3 mg in the evening   Yes [provider]  sertraline (ZOLOFT) 100 MG tablet Take 100 mg by mouth in the morning. 12/30/23  Yes [provider]  benzonatate  (TESSALON ) 100 MG capsule Take 1 capsule (100 mg total) by mouth every 8 (eight) hours. Patient not taking: Reported on 05/03/2024 03/22/21   Roselyn Carlin NOVAK, MD  diltiazem  (CARDIZEM  CD) 120 MG 24 hr capsule Take 1 capsule (120 mg total) by mouth daily. Patient not taking: Reported on 05/03/2024 10/08/23   Nahser, Aleene PARAS, MD  hydrOXYzine  (ATARAX /VISTARIL ) 25 MG tablet Take 1 tablet (25 mg total) by mouth every 8 (eight) hours as needed for anxiety. Patient not taking: Reported on 05/03/2024 03/22/21   Roselyn Carlin NOVAK, MD  rosuvastatin  (CRESTOR ) 10 MG tablet Take 1 tablet (10 mg total) by mouth daily. Patient not taking: Reported on 05/03/2024 09/10/23   Lelon Hamilton T, PA-C      reports that she quit smoking about 44 years ago. Her smoking use included cigarettes. She  has never used smokeless tobacco. She reports current alcohol use of about 7.0 standard drinks of alcohol per week. She reports that she does not use drugs. Lives with Husband Currently works at/ unemployed currently Tobacco- Previous use, denies current use EtOH- Last drink was Monday  Illicit drug use- denies use.  IADLs/ADLs- can person independently at baseline   Physical Exam: Vitals:   05/04/24 1404 05/04/24 1505 05/04/24 2100 05/04/24 2101  BP: (!) 108/48 (!) 108/48 (!) 116/50 (!) 116/50  Pulse: 78 78 (!) 110   Resp:  18  20  Temp:  98.5 F (36.9 C)  98.4 F (36.9 C)  TempSrc:  Oral  Oral  SpO2:  95%  90%  Weight:      Height:       Gen: elderly female moving in bed  HENT: NCAT CV: RRR, good pulses Resp: CTAB Abd: No TTP, normal bowel sounds MSK: No asymmetry, moving all extremities Skin: no lesion on skin noted Neuro: alert but oriented to self only Psych: agitated mood   Labs on Admission: I have personally reviewed following labs and imaging studies  CBC: Recent Labs  Lab 05/03/24 0720 05/04/24 0811  WBC 6.8 11.4*  HGB 12.0 10.0*  HCT 37.1 32.0*  MCV 95.4 98.2  PLT 301 300   Basic Metabolic Panel: Recent Labs  Lab 05/03/24 0720 05/04/24 1849  NA 143 141  K 4.0 4.1  CL 107 105  CO2 24 27  GLUCOSE 94 107*  BUN 16 11  CREATININE 0.50 0.69  CALCIUM  9.1 9.1   GFR: Estimated Creatinine Clearance: 60.1 mL/min (by C-G formula based on SCr of 0.69 mg/dL). Liver Function Tests: No results for input(s): AST, ALT, ALKPHOS, BILITOT, PROT, ALBUMIN in the last 168 hours. No results for input(s): LIPASE, AMYLASE in the last 168 hours. No results for input(s): AMMONIA in the last 168 hours. Coagulation Profile: No results for input(s): INR, PROTIME in the last 168 hours. Cardiac Enzymes: No results for input(s): CKTOTAL, CKMB, CKMBINDEX, TROPONINI, TROPONINIHS in the last 168 hours. BNP (last 3 results) No results for  input(s): BNP in the last 8760 hours. HbA1C: No results for input(s): HGBA1C in the last 72 hours. CBG: No results for input(s): GLUCAP in the last 168 hours. Lipid Profile: No results for input(s): CHOL, HDL, LDLCALC, TRIG, CHOLHDL, LDLDIRECT in the last 72 hours. Thyroid  Function Tests: No results for input(s): TSH, T4TOTAL, FREET4, T3FREE, THYROIDAB in the last 72 hours. Anemia Panel: No results for input(s): VITAMINB12, FOLATE, FERRITIN, TIBC, IRON, RETICCTPCT in the last 72 hours. Urine analysis:    Component Value Date/Time   COLORURINE YELLOW 04/03/2023 2104   APPEARANCEUR CLEAR 04/03/2023 2104   LABSPEC 1.010 04/03/2023 2104   PHURINE 6.5 04/03/2023 2104   GLUCOSEU NEGATIVE 04/03/2023 2104   HGBUR SMALL (A) 04/03/2023 2104   HGBUR trace-lysed 06/11/2009 0833   BILIRUBINUR NEGATIVE 04/03/2023 2104   KETONESUR 80 (A) 04/03/2023 2104   PROTEINUR NEGATIVE 04/03/2023 2104   UROBILINOGEN 0.2 06/11/2009 0833   NITRITE NEGATIVE 04/03/2023 2104   LEUKOCYTESUR TRACE (A) 04/03/2023 2104    Radiological Exams on Admission: I have personally reviewed images DG HIP UNILAT W OR W/O PELVIS 2-3 VIEWS LEFT Result Date: 05/03/2024 CLINICAL DATA:  Postop.  Periprosthetic fracture. EXAM: DG HIP (WITH OR WITHOUT PELVIS) 2-3V*L* COMPARISON:  Preoperative imaging FINDINGS: Revision of left hip arthroplasty in expected alignment. Cerclage wire fixation of periprosthetic fracture, fracture line not well demonstrated by radiograph. Recent postsurgical change includes air and edema in the soft tissues. IMPRESSION: Revision of left hip arthroplasty in expected alignment. Cerclage wire fixation of periprosthetic fracture, known fracture is not well demonstrated by radiograph. Electronically Signed   By: Andrea Gasman M.D.   On: 05/03/2024 18:23   DG HIP UNILAT WITH PELVIS 1V LEFT Result Date: 05/03/2024 CLINICAL DATA:  Elective surgery. EXAM: DG HIP (WITH OR  WITHOUT PELVIS) 1V*L* COMPARISON:  Radiograph and CT 04/25/2024 FINDINGS: Seven fluoroscopic spot views of the hip submitted from the operating room. Imaging obtained during revision exchange of femoral component of hip arthroplasty with a cerclage wire about the  proximal femur. Fluoroscopy time 6 seconds. Dose 0.57 mGy. IMPRESSION: Intraoperative fluoroscopy during revision of left hip arthroplasty. Electronically Signed   By: Andrea Gasman M.D.   On: 05/03/2024 18:22   DG C-Arm 1-60 Min-No Report Result Date: 05/03/2024 Fluoroscopy was utilized by the requesting physician.  No radiographic interpretation.   DG C-Arm 1-60 Min-No Report Result Date: 05/03/2024 Fluoroscopy was utilized by the requesting physician.  No radiographic interpretation.    EKG: My personal interpretation of EKG shows: pending  Assessment/Plan Principal Problem:   Periprosthetic fracture around internal prosthetic left hip joint, initial encounter [facility]) Active Problems:   Acute encephalopathy   Acute encephalopathy Differential diagnoses include hospital delirium versus drug side effect versus alcohol withdrawal.  Most likely appears to be hospital delirium as patient was fine up until today and this was more of an acute worsening rather than a gradual that I would expect with alcohol withdrawal.  Reviewed her vitals over the last few days and she has not been tachycardic and per nursing has not been diaphoretic.  Her elevated CIWA score was mainly due to anxiety and hallucinations that she reported.  When trying to elicit history regarding her alcohol use, husband becomes becomes frustrated as he does not think she is having any alcohol withdrawal and actually has worsened after getting Ativan .  He is strictly against giving his wife anymore Ativan .  Will get CT head to look for any acute intracranial pathology.  My suspicion is high for hospital delirium given multiple opioids and centrally acting medication patient  is taking.  Delirium precautions ordered.  Will continue to monitor CIWA score to ensure we do not miss alcohol withdrawal but this is unlikely etiology.  Will move patient to progressive level of care for closer monitoring.  If her CIWA's continue to trend up or she has other signs of alcohol withdrawal we can initiate therapy there.  I adjusted some of her medications to increase her Toradol  for pain and decrease the dose of IV Dilaudid  to 0.5 mg. - Suspect hospital delirium but will look for other causes including CT head, VBG, BMP and UA to look for renal dysfunction, hepatic panel.  Less concern for stroke given she has no focal deficits. - Limited centrally acting medications but also control her pain. - Discontinue Ativan  but continue CIWA monitoring.  Periprosthetic fracture: Ortho surgery following patient.  Chronic problems: Bipolar disorder: Continue home medicines Seizure disorder: Continue home medicines MDD/GAD: Continue home medicines RLS: continue home med  VTE prophylaxis:  Lovenox  Diet: NPO except meds Code Status:  Full Code Telemetry:  Admission status: Inpatient, Progressive Patient is from: Med surg  Anticipated d/c is to: SNF/Home  Anticipated d/c is in: TBD days  Family Communication: Husband and daughter at bedside      Severity of Illness: The appropriate patient status for this patient is INPATIENT. Inpatient status is judged to be reasonable and necessary in order to provide the required intensity of service to ensure the patient's safety. The patient's presenting symptoms, physical exam findings, and initial radiographic and laboratory data in the context of their chronic comorbidities is felt to place them at high risk for further clinical deterioration. Furthermore, it is not anticipated that the patient will be medically stable for discharge from the hospital within 2 midnights of admission.   * I certify that at the point of admission it is my clinical  judgment that the patient will require inpatient hospital care spanning beyond 2 midnights from the  point of admission due to high intensity of service, high risk for further deterioration and high frequency of surveillance required.DEWAINE Morene Bathe, MD [facility]. [facility]

## 2024-05-04 NOTE — Progress Notes (Signed)
 Physical Therapy Treatment Patient Details Name: Megan Singleton MRN: [id number] DOB: [birth year] Today's Date: 05/04/2024   History of Present Illness Pt s/p L THR rev 2* proximal femur periprosthetic fx.  Pt wtih hx of Sz, fibromyalgia, RLS, ETOH abuse and L THR 04/07/24    PT Comments  Pt cooperative but with noted to be more easily agitated.  Pt assisted from recliner to step pvt to bedside - pt requiring step-be-step cues and with noted deterioration in stability and pt with several losses of balance posteriorly - pt noted to have received Ativan  ~45 minutes prior to PT arrival.    If plan is discharge home, recommend the following: A lot of help with walking and/or transfers;A lot of help with bathing/dressing/bathroom;Assistance with cooking/housework;Assist for transportation;Help with stairs or ramp for entrance   Can travel by private vehicle        Equipment Recommendations  None recommended by PT    Recommendations for Other Services OT consult     Precautions / Restrictions Precautions Precautions: Fall;Posterior Hip Precaution Booklet Issued: Yes (comment) Recall of Precautions/Restrictions: Impaired Precaution/Restrictions Comments: reviewed all THP Restrictions Weight Bearing Restrictions Per Provider Order: Yes LLE Weight Bearing Per Provider Order: Partial weight bearing LLE Partial Weight Bearing Percentage or Pounds: 50     Mobility  Bed Mobility Overal bed mobility: Needs Assistance Bed Mobility: Sit to Supine     Supine to sit: Mod assist, +2 for physical assistance, +2 for safety/equipment, Used rails Sit to supine: Mod assist, +2 for physical assistance, +2 for safety/equipment   General bed mobility comments: Increased time, cues for sequence and use of R LE to self asisst; physical assist to manage L LE, to control trunk and to complete rotation with use of bed pad    Transfers Overall transfer level: Needs assistance Equipment used: Rolling  walker (2 wheels) Transfers: Sit to/from Stand, Bed to chair/wheelchair/BSC Sit to Stand: Min assist, Mod assist, +2 safety/equipment, From elevated surface   Step pivot transfers: Min assist, Mod assist, +2 physical assistance, +2 safety/equipment, From elevated surface       General transfer comment: Cues for LE management, use of UEs to self assist, and adherence to THP.  Step pvt with RW recliner to bedside - pt very unstable with multiple episodes balance loss posteriorly    Ambulation/Gait Ambulation/Gait assistance: Min assist, Mod assist, +2 physical assistance, +2 safety/equipment       Gait velocity: decr     General Gait Details: Step pvt to bed only - limited by c/o dizziness and marked increase in instability   Stairs             Wheelchair Mobility     Tilt Bed    Modified Rankin (Stroke Patients Only)       Balance Overall balance assessment: History of Falls, Needs assistance Sitting-balance support: No upper extremity supported, Feet supported Sitting balance-Leahy Scale: Fair     Standing balance support: Bilateral upper extremity supported Standing balance-Leahy Scale: Poor                              Communication Communication Communication: No apparent difficulties  Cognition Arousal: Alert Behavior During Therapy: Anxious   PT - Cognitive impairments: Problem solving, Safety/Judgement                         Following commands: Impaired Following commands impaired: Follows one step  commands inconsistently    Cueing Cueing Techniques: Verbal cues, Gestural cues  Exercises Total Joint Exercises Ankle Circles/Pumps: AROM, Both, 15 reps, Supine    General Comments        Pertinent Vitals/Pain Pain Assessment Pain Assessment: 0-10 Pain Score: 5  Pain Location: L hip with movement Pain Descriptors / Indicators: Aching, Sore Pain Intervention(s): Limited activity within patient's tolerance, Monitored  during session, Premedicated before session, Ice applied    Home Living                          Prior Function            PT Goals (current goals can now be found in the care plan section) Acute Rehab PT Goals Patient Stated Goal: to go home PT Goal Formulation: With patient Time For Goal Achievement: 05/10/24 Potential to Achieve Goals: Good Progress towards PT goals: Progressing toward goals    Frequency    7X/week      PT Plan      Co-evaluation              AM-PAC PT 6 Clicks Mobility   Outcome Measure  Help needed turning from your back to your side while in a flat bed without using bedrails?: Total Help needed moving from lying on your back to sitting on the side of a flat bed without using bedrails?: Total Help needed moving to and from a bed to a chair (including a wheelchair)?: Total Help needed standing up from a chair using your arms (e.g., wheelchair or bedside chair)?: Total Help needed to walk in hospital room?: Total Help needed climbing 3-5 steps with a railing? : Total 6 Click Score: 6    End of Session Equipment Utilized During Treatment: Gait belt Activity Tolerance: Patient limited by fatigue Patient left: in bed;with call bell/phone within reach;with bed alarm set;with family/visitor present Nurse Communication: Mobility status PT Visit Diagnosis: Pain;Difficulty in walking, not elsewhere classified (R26.2) Pain - Right/Left: Left Pain - part of body: Hip     Time: 8557-8542 PT Time Calculation (min) (ACUTE ONLY): 15 min  Charges:    $Therapeutic Activity: 8-22 mins PT General Charges $$ ACUTE PT VISIT: 1 Visit                     [facility] PT Acute Rehabilitation Services Office [phone]    Madisan Bice 05/04/2024, 5:22 PM

## 2024-05-05 ENCOUNTER — Inpatient Hospital Stay (HOSPITAL_COMMUNITY)

## 2024-05-05 ENCOUNTER — Inpatient Hospital Stay (HOSPITAL_COMMUNITY)
Admission: AD | Admit: 2024-05-05 | Discharge: 2024-05-05 | Disposition: A | Discharge status: Home / Self Care | Source: Ambulatory Visit | Attending: Nurse Practitioner | Admitting: Nurse Practitioner

## 2024-05-05 ENCOUNTER — Encounter (HOSPITAL_COMMUNITY): Payer: Self-pay | Admitting: Orthopedic Surgery

## 2024-05-05 DIAGNOSIS — I10 Essential (primary) hypertension: Secondary | ICD-10-CM | POA: Diagnosis not present

## 2024-05-05 DIAGNOSIS — R4182 Altered mental status, unspecified: Secondary | ICD-10-CM

## 2024-05-05 DIAGNOSIS — R569 Unspecified convulsions: Secondary | ICD-10-CM | POA: Diagnosis not present

## 2024-05-05 DIAGNOSIS — M9702XA Periprosthetic fracture around internal prosthetic left hip joint, initial encounter: Secondary | ICD-10-CM | POA: Diagnosis not present

## 2024-05-05 LAB — CBC WITH DIFFERENTIAL/PLATELET
Abs Immature Granulocytes: 0.05 K/uL (ref 0.00–0.07)
Basophils Absolute: 0 K/uL (ref 0.0–0.1)
Basophils Relative: 0 %
Eosinophils Absolute: 0.1 K/uL (ref 0.0–0.5)
Eosinophils Relative: 1 %
HCT: 27.9 % — ABNORMAL LOW (ref 36.0–46.0)
Hemoglobin: 9 g/dL — ABNORMAL LOW (ref 12.0–15.0)
Immature Granulocytes: 1 %
Lymphocytes Relative: 19 %
Lymphs Abs: 1.7 K/uL (ref 0.7–4.0)
MCH: 32.3 pg (ref 26.0–34.0)
MCHC: 32.3 g/dL (ref 30.0–36.0)
MCV: 100 fL (ref 80.0–100.0)
Monocytes Absolute: 0.8 K/uL (ref 0.1–1.0)
Monocytes Relative: 9 %
Neutro Abs: 6.6 K/uL (ref 1.7–7.7)
Neutrophils Relative %: 70 %
Platelets: 237 K/uL (ref 150–400)
RBC: 2.79 MIL/uL — ABNORMAL LOW (ref 3.87–5.11)
RDW: 13.5 % (ref 11.5–15.5)
WBC: 9.3 K/uL (ref 4.0–10.5)
nRBC: 0 % (ref 0.0–0.2)

## 2024-05-05 LAB — CBC
HCT: 30.8 % — ABNORMAL LOW (ref 36.0–46.0)
Hemoglobin: 9.8 g/dL — ABNORMAL LOW (ref 12.0–15.0)
MCH: 31.9 pg (ref 26.0–34.0)
MCHC: 31.8 g/dL (ref 30.0–36.0)
MCV: 100.3 fL — ABNORMAL HIGH (ref 80.0–100.0)
Platelets: 247 K/uL (ref 150–400)
RBC: 3.07 MIL/uL — ABNORMAL LOW (ref 3.87–5.11)
RDW: 13.5 % (ref 11.5–15.5)
WBC: 9.8 K/uL (ref 4.0–10.5)
nRBC: 0 % (ref 0.0–0.2)

## 2024-05-05 LAB — COMPREHENSIVE METABOLIC PANEL WITH GFR
ALT: 10 U/L (ref 0–44)
AST: 28 U/L (ref 15–41)
Albumin: 4.1 g/dL (ref 3.5–5.0)
Alkaline Phosphatase: 130 U/L — ABNORMAL HIGH (ref 38–126)
Anion gap: 10 (ref 5–15)
BUN: 10 mg/dL (ref 8–23)
CO2: 27 mmol/L (ref 22–32)
Calcium: 9.3 mg/dL (ref 8.9–10.3)
Chloride: 104 mmol/L (ref 98–111)
Creatinine, Ser: 0.74 mg/dL (ref 0.44–1.00)
GFR, Estimated: >60 mL/min (ref >60)
Glucose, Bld: 97 mg/dL (ref 70–99)
Potassium: 3.5 mmol/L (ref 3.5–5.1)
Sodium: 141 mmol/L (ref 135–145)
Total Bilirubin: 0.3 mg/dL (ref 0.0–1.2)
Total Protein: 6 g/dL — ABNORMAL LOW (ref 6.5–8.1)

## 2024-05-05 LAB — BASIC METABOLIC PANEL WITH GFR
Anion gap: 9 (ref 5–15)
BUN: 11 mg/dL (ref 8–23)
CO2: 25 mmol/L (ref 22–32)
Calcium: 8.8 mg/dL — ABNORMAL LOW (ref 8.9–10.3)
Chloride: 106 mmol/L (ref 98–111)
Creatinine, Ser: 0.52 mg/dL (ref 0.44–1.00)
GFR, Estimated: >60 mL/min (ref >60)
Glucose, Bld: 97 mg/dL (ref 70–99)
Potassium: 3.6 mmol/L (ref 3.5–5.1)
Sodium: 140 mmol/L (ref 135–145)

## 2024-05-05 LAB — HEPATIC FUNCTION PANEL
ALT: 11 U/L (ref 0–44)
AST: 28 U/L (ref 15–41)
Albumin: 4.2 g/dL (ref 3.5–5.0)
Alkaline Phosphatase: 140 U/L — ABNORMAL HIGH (ref 38–126)
Bilirubin, Direct: 0.1 mg/dL (ref 0.0–0.2)
Indirect Bilirubin: 0.2 mg/dL — ABNORMAL LOW (ref 0.3–0.9)
Total Bilirubin: 0.3 mg/dL (ref 0.0–1.2)
Total Protein: 6.1 g/dL — ABNORMAL LOW (ref 6.5–8.1)

## 2024-05-05 LAB — URINALYSIS, ROUTINE W REFLEX MICROSCOPIC
Bilirubin Urine: NEGATIVE
Glucose, UA: NEGATIVE mg/dL
Hgb urine dipstick: NEGATIVE
Ketones, ur: 20 mg/dL — AB
Leukocytes,Ua: NEGATIVE
Nitrite: NEGATIVE
Protein, ur: NEGATIVE mg/dL
Specific Gravity, Urine: 1.026 (ref 1.005–1.030)
pH: 5 (ref 5.0–8.0)

## 2024-05-05 LAB — LACTIC ACID, PLASMA: Lactic Acid, Venous: 1.3 mmol/L (ref 0.5–1.9)

## 2024-05-05 LAB — BLOOD GAS, VENOUS
Acid-Base Excess: 2.6 mmol/L — ABNORMAL HIGH (ref 0.0–2.0)
Acid-Base Excess: 3.5 mmol/L — ABNORMAL HIGH (ref 0.0–2.0)
Bicarbonate: 29.7 mmol/L — ABNORMAL HIGH (ref 20.0–28.0)
Bicarbonate: 29.9 mmol/L — ABNORMAL HIGH (ref 20.0–28.0)
Drawn by: 69686
O2 Saturation: 18.4 %
O2 Saturation: 26.3 %
Patient temperature: 36.9
Patient temperature: 37
pCO2, Ven: 53 mmHg (ref 44–60)
pCO2, Ven: 55 mmHg (ref 44–60)
pH, Ven: 7.34 (ref 7.25–7.43)
pH, Ven: 7.36 (ref 7.25–7.43)
pO2, Ven: <31 mmHg — CL (ref 32–45)
pO2, Ven: <31 mmHg — CL (ref 32–45)

## 2024-05-05 LAB — ECHOCARDIOGRAM COMPLETE
Area-P 1/2: 2.93 cm2
Height: 64 in
S' Lateral: 3.2 cm
Weight: 2388.8 [oz_av]

## 2024-05-05 LAB — AMMONIA: Ammonia: 31 umol/L (ref 9–35)

## 2024-05-05 LAB — TSH: TSH: 4.63 u[IU]/mL — ABNORMAL HIGH (ref 0.350–4.500)

## 2024-05-05 MED ORDER — PNEUMOCOCCAL 20-VAL CONJ VACC 0.5 ML IM SUSY
0.5000 mL | PREFILLED_SYRINGE | INTRAMUSCULAR | Status: DC
  Filled 2024-05-05: qty 0.5

## 2024-05-05 MED ORDER — KETOROLAC TROMETHAMINE 30 MG/ML IJ SOLN
15.0000 mg | Freq: Four times a day (QID) | INTRAMUSCULAR | Status: DC | PRN
  Administered 2024-05-05 – 2024-05-08 (×6): 15 mg via INTRAVENOUS
  Filled 2024-05-05 (×6): qty 1

## 2024-05-05 MED ORDER — MELATONIN 5 MG PO TABS
5.0000 mg | ORAL_TABLET | Freq: Every evening | ORAL | Status: DC | PRN
  Administered 2024-05-05: 5 mg via ORAL
  Filled 2024-05-05: qty 1

## 2024-05-05 MED ORDER — LEVETIRACETAM (KEPPRA) 500 MG/5 ML ADULT IV PUSH
750.0000 mg | Freq: Two times a day (BID) | INTRAVENOUS | Status: DC
  Administered 2024-05-05 – 2024-05-06 (×3): 750 mg via INTRAVENOUS
  Filled 2024-05-05 (×3): qty 10

## 2024-05-05 MED ORDER — IOHEXOL 350 MG/ML SOLN
75.0000 mL | Freq: Once | INTRAVENOUS | Status: AC | PRN
  Administered 2024-05-05: 75 mL via INTRAVENOUS

## 2024-05-05 NOTE — Consult Note (Addendum)
 [facility] Consult Services   Patient Name:   Megan, Singleton Date of Birth:   09/04/1950 Identification Number:   MRN - [id number] Date of Service:   05/04/2024 23:29:03  Diagnosis:       G93.49 - Encephalopathy Multifactorial  Impression:      The patient has a h/o alcohol abuse and anxiety, depression, and seizure and presents with unresponsiveness. Currently she is arousable but somnolent and dysoriented. NIHSS is 3, and nonfocal exam. However LKWT is unclear given she has waxed and waned. She is not a candidate for IV thrombolysis. toxic/metabolic/infectious cause of encephalopathy is suspected and more likely than stroke but I cannot rule out a stroke in this case and further workup is advised.  Our recommendations are outlined below.  Recommendations:        Stroke/Telemetry Floor       Neuro Checks       Bedside Swallow Eval       DVT Prophylaxis       IV Fluids, Normal Saline       Head of Bed 30 Degrees       Euglycemia and Avoid Hyperthermia (PRN Acetaminophen )       Initiate or continue Aspirin  81 MG daily       ADDENDUM:  Advanced Imaging: CTA Head and Neck Completed.  CT perfusion Completed.  LVO:No  Patient is not eligible for NIR consideration. Proceed with plan as discussed.       If no LVO, then proceed with MRI brain and EEG for further workup.  Sign Out:       Discussed with Emergency Department Provider    ------------------------------------------------------------------------------  Advanced Imaging: Advanced imaging has been ordered. Results pending.   Metrics: Last Known Well: Unknown Dispatch Time: 05/04/2024 23:29:03 Initial Response Time: 05/04/2024 23:41:26 Symptoms: unresponsive. Initial patient interaction: 05/04/2024 23:54:05 NIHSS Assessment Completed: 05/05/2024 00:04:11 Patient is not a candidate for Thrombolytic. Thrombolytic Medical Decision: 05/05/2024 00:06:03 Patient was not deemed  candidate for Thrombolytic because of following reasons: LKW outside 4.5 hr window. .  CT Head: I personally reviewed all the CT images that were available to me and it showed: No Acute Hemorrhage or Acute Core Infarct  Primary Provider Notified of Diagnostic Impression and Management Plan on: 05/05/2024 00:24:23 Spoke With: abigail NP Able to Reach 05/05/2024 00:24:23    ------------------------------------------------------------------------------  History of Present Illness: Patient is a 73 year old Female.  Inpatient stroke alert was called for symptoms of unresponsive. She had hip surgery on the 14th. she fell asleep at 8:30pm and then was hard to arouse. RN woke her up but she was not oriented. She was under CIWA protocol and that was discontinued. Last dose of ativan  was 2:30pm. She has been getting pain meds and other sedating meds. Since moving to CT she has been more responsive but still somnolent. LKWT is unknown. She has a h/o seizures and takes keppra but no witnessed seizures.   Past Medical History:      Seizures      There is no history of Stroke Other PMH:  hypothyroidism  alcohol abuse  depression  RLS  hematuria  incontinence  thoracic back pain  panic attacks  Medications:  No Anticoagulant use  Antiplatelet use: Yes asa Reviewed EMR for current medications  Allergies:  Reviewed  Social History: Alcohol Use: Yes  Family History:  There is no family history of premature cerebrovascular disease pertinent to this consultation  ROS : 14 Points Review of Systems  was performed and was negative except mentioned in HPI.  Past Surgical History: There Is No Surgical History Contributory To Today's Visit    Examination: BP(128/48), Pulse(68), Blood Glucose(96) 1A: Level of Consciousness - Requires repeated stimulation to arouse + 2 1B: Ask Month and Age - 1 Question Right + 1 1C: Blink Eyes & Squeeze Hands - Performs Both Tasks + 0 2: Test  Horizontal Extraocular Movements - Normal + 0 3: Test Visual Fields - No Visual Loss + 0 4: Test Facial Palsy (Use Grimace if Obtunded) - Normal symmetry + 0 5A: Test Left Arm Motor Drift - No Drift for 10 Seconds + 0 5B: Test Right Arm Motor Drift - No Drift for 10 Seconds + 0 6A: Test Left Leg Motor Drift - No Drift for 5 Seconds + 0 6B: Test Right Leg Motor Drift - No Drift for 5 Seconds + 0 7: Test Limb Ataxia (FNF/Heel-Shin) - No Ataxia + 0 8: Test Sensation - Normal; No sensory loss + 0 9: Test Language/Aphasia - Normal; No aphasia + 0 10: Test Dysarthria - Normal + 0 11: Test Extinction/Inattention - No abnormality + 0  NIHSS Score: 3   Pre-Morbid Modified Rankin Scale: 2 Points = Slight disability; unable to carry out all previous activities, but able to look after own affairs without assistance  Spoke with : abigail NP I reviewed the available imaging via [facility] and initiated discussion with the primary provider  This consult was conducted in real time using interactive audio and Immunologist. Patient was informed of the technology being used for this visit and agreed to proceed. Patient located in hospital and provider located at home/office setting.   Patient is being evaluated for possible acute neurologic impairment and high probability of imminent or life-threatening deterioration. I spent total of 35 minutes providing care to this patient, including time for face to face visit via telemedicine, review of medical records, imaging studies and discussion of findings with providers, the patient and/or family.    Dr Daril Door   [facility] For Inpatient follow-up with [facility] physician please call [facility] at [phone]. As we are not an outpatient service for any post hospital discharge needs please contact the hospital for assistance. If you have any questions for the [facility] physicians or need to reconsult for clinical or diagnostic changes please  contact us  via [facility] at [phone].   Signature : Daril Door

## 2024-05-05 NOTE — Procedures (Signed)
 Patient Name: Megan Singleton  MRN: [id number]  Epilepsy Attending: Arlin MALVA Krebs  Referring Physician/Provider: Andrez Chroman, NP  Date: 05/05/2024 Duration: 32.24 mins  Patient history: 73yo F with ams. EEG to evaluate for seizure  Level of alertness: Awake  AEDs during EEG study: LEV  Technical aspects: This EEG study was done with scalp electrodes positioned according to the 10-20 International system of electrode placement. Electrical activity was reviewed with band pass filter of 1-70Hz , sensitivity of 7 uV/mm, display speed of 40mm/sec with a 60Hz  notched filter applied as appropriate. EEG data were recorded continuously and digitally stored.  Video monitoring was available and reviewed as appropriate.  Description: The posterior dominant rhythm consists of 8-9 Hz activity of moderate voltage (25-35 uV) seen predominantly in posterior head regions, symmetric and reactive to eye opening and eye closing. Hyperventilation and photic stimulation were not performed.     IMPRESSION: This study is within normal limits. No seizures or epileptiform discharges were seen throughout the recording.  A normal interictal EEG does not exclude the diagnosis of epilepsy.   Megan Singleton

## 2024-05-05 NOTE — Progress Notes (Signed)
 Physical Therapy Treatment Patient Details Name: Megan Singleton MRN: [id number] DOB: 06-Jun-1951 Today's Date: 05/05/2024   History of Present Illness Pt s/p L THR rev 2* proximal femur periprosthetic fx.  Pt wtih hx of Sz, fibromyalgia, RLS, ETOH abuse and L THR 04/07/24    PT Comments  Pt requiring encouragement, increased time, and significant assist of 2 for safe completion of basic mobility tasks required to exit bed and step pvt with RW to recliner.  Pt with limited carry-over from previous sessions and recalls 1/3 THP only.    If plan is discharge home, recommend the following: A lot of help with walking and/or transfers;A lot of help with bathing/dressing/bathroom;Assistance with cooking/housework;Assist for transportation;Help with stairs or ramp for entrance   Can travel by private vehicle        Equipment Recommendations  None recommended by PT    Recommendations for Other Services OT consult     Precautions / Restrictions Precautions Precautions: Fall;Posterior Hip Precaution Booklet Issued: Yes (comment) Recall of Precautions/Restrictions: Impaired Precaution/Restrictions Comments: Pt recalls only 1/3 THP - reviewed all THP Restrictions Weight Bearing Restrictions Per Provider Order: Yes LLE Weight Bearing Per Provider Order: Partial weight bearing LLE Partial Weight Bearing Percentage or Pounds: 50%     Mobility  Bed Mobility Overal bed mobility: Needs Assistance Bed Mobility: Supine to Sit     Supine to sit: Mod assist, +2 for physical assistance, +2 for safety/equipment, Used rails     General bed mobility comments: Increased time, cues for sequence and use of R LE to self asisst; physical assist to manage L LE, to control trunk and to complete rotation with use of bed pad    Transfers Overall transfer level: Needs assistance Equipment used: Rolling walker (2 wheels) Transfers: Sit to/from Stand, Bed to chair/wheelchair/BSC Sit to Stand: Min  assist, Mod assist, +2 safety/equipment, From elevated surface   Step pivot transfers: Min assist, Mod assist, +2 physical assistance, +2 safety/equipment, From elevated surface       General transfer comment: Cues for LE management, use of UEs to self assist, and adherence to THP.  Step pvt with RW  bedside to recliner    Ambulation/Gait               General Gait Details: Step pvt bed to chair only - pt ltd by pain/fatigue   Stairs             Wheelchair Mobility     Tilt Bed    Modified Rankin (Stroke Patients Only)       Balance Overall balance assessment: History of Falls, Needs assistance Sitting-balance support: No upper extremity supported, Feet supported Sitting balance-Leahy Scale: Fair     Standing balance support: Bilateral upper extremity supported Standing balance-Leahy Scale: Poor                              Communication Communication Communication: No apparent difficulties  Cognition Arousal: Alert Behavior During Therapy: Anxious, Flat affect   PT - Cognitive impairments: Problem solving, Safety/Judgement                         Following commands: Impaired Following commands impaired: Follows one step commands inconsistently    Cueing Cueing Techniques: Verbal cues, Gestural cues  Exercises Total Joint Exercises Ankle Circles/Pumps: AROM, Both, 15 reps, Supine    General Comments  Pertinent Vitals/Pain Pain Assessment Pain Assessment: Faces Faces Pain Scale: Hurts even more Pain Location: L hip with movement Pain Descriptors / Indicators: Aching, Grimacing, Guarding, Sore Pain Intervention(s): Limited activity within patient's tolerance, Monitored during session, Premedicated before session (pt allowed Tylenol  only 2* overnight lethargy/confusion)    Home Living                          Prior Function            PT Goals (current goals can now be found in the care plan  section) Acute Rehab PT Goals Patient Stated Goal: to go home PT Goal Formulation: With patient Time For Goal Achievement: 05/10/24 Potential to Achieve Goals: Good Progress towards PT goals: Progressing toward goals    Frequency    7X/week      PT Plan      Co-evaluation              AM-PAC PT 6 Clicks Mobility   Outcome Measure  Help needed turning from your back to your side while in a flat bed without using bedrails?: A Lot Help needed moving from lying on your back to sitting on the side of a flat bed without using bedrails?: A Lot Help needed moving to and from a bed to a chair (including a wheelchair)?: A Lot Help needed standing up from a chair using your arms (e.g., wheelchair or bedside chair)?: A Lot Help needed to walk in hospital room?: Total Help needed climbing 3-5 steps with a railing? : Total 6 Click Score: 10    End of Session Equipment Utilized During Treatment: Gait belt Activity Tolerance: Patient limited by fatigue;Patient limited by pain Patient left: in chair;with call bell/phone within reach;with chair alarm set;with family/visitor present Nurse Communication: Mobility status PT Visit Diagnosis: Pain;Difficulty in walking, not elsewhere classified (R26.2) Pain - Right/Left: Left Pain - part of body: Hip     Time: 1146-1209 PT Time Calculation (min) (ACUTE ONLY): 23 min  Charges:    $Therapeutic Activity: 8-22 mins PT General Charges $$ ACUTE PT VISIT: 1 Visit                     [facility] PT Acute Rehabilitation Services Office [phone]    [facility] 05/05/2024, 12:27 PM

## 2024-05-05 NOTE — Progress Notes (Signed)
 PROGRESS NOTE    Megan Singleton  FMW:[id number] DOB: 1951/03/31 DOA: 05/02/2024 PCP: Jolee Madelin Patch, MD   Brief Narrative:  73 y.o. year old female with past medical history of hypothyroidism, hyperlipidemia restless leg syndrome, seizure disorder, bipolar disorder, osteoarthritis with recent left hip arthroplasty who developed periprosthetic fracture and was admitted by the orthopedic surgery team for repair on 05/02/24 for repeat surgical intervention.  [facility] was consulted by orthopedic surgery on 05/04/2024 for altered mental status.  Patient continued to have altered mental status: Code stroke was called and patient was evaluated by teleneurology.  CT head negative for acute intracranial abnormality and CTA head and neck was negative for large vessel occlusion or other emergent findings.  MRI brain and EEG recommended by teleneurology.  Care was transferred to [facility] as primary team.  Assessment & Plan:   Acute encephalopathy: Unclear if this is acute metabolic or toxic encephalopathy versus if she had a seizure History of seizures -Patient had acute alteration of mental status on 05/04/2024 which continued to persist. Code stroke was called and patient was evaluated by teleneurology.  CT head negative for acute intracranial abnormality and CTA head and neck was negative for large vessel occlusion or other emergent findings.  MRI brain and EEG recommended by teleneurology.  Care was transferred to [facility] as primary team. -Ammonia normal.  TSH only slightly elevated.  Check B12 and folic acid levels in AM. - Has not received any sedating medications including IV Ativan  since yesterday afternoon.  Still slow to respond.  Monitor mental status.  Follow MRI of brain and EEG.  Fall precautions.  PT/OT/SLP evaluation.  Hold sedating medications. - Switch Keppra to IV for now.  Periprosthetic fracture -Management as per orthopedics team  Leukocytosis - Resolved  Anemia of chronic  disease -from chronic illnesses.  Globin stable.  Monitor intermittently  Bipolar disorder MDD/GAD -Hold medications for now.  Hypothyroidism-continue levothyroxine    DVT prophylaxis: Lovenox Code Status: Full Family Communication: Daughter at bedside Disposition Plan: Status is: Inpatient Remains inpatient appropriate because: Of severity of illness    Consultants: Orthopedics.  Teleneurology  Procedures: As above  Antimicrobials: None   Subjective: Patient seen and examined at bedside.  Wakes up only very slightly, extremely slow to respond.  No fever, vomiting, seizures reported.  Objective: Vitals:   05/04/24 2335 05/05/24 0034 05/05/24 0445 05/05/24 0750  BP: (!) 128/48 (!) 131/43 (!) 110/46 (!) 123/53  Pulse: 82 94 77 82  Resp:  18 18 17   Temp: 100.3 F (37.9 C) 99.2 F (37.3 C) 97.8 F (36.6 C) 98.7 F (37.1 C)  TempSrc: Oral Oral Oral Oral  SpO2: 98% 95% 95% 95%  Weight:      Height:        Intake/Output Summary (Last 24 hours) at 05/05/2024 1122 Last data filed at 05/05/2024 0600 Gross per 24 hour  Intake 680 ml  Output 550 ml  Net 130 ml   Filed Weights   05/02/24 1642 05/03/24 1052  Weight: 67.7 kg 67.7 kg    Examination:  General exam: Appears calm and comfortable.  Looks chronically ill and deconditioned. Respiratory system: Bilateral decreased breath sounds at bases, no wheezing Cardiovascular system: S1 & S2 heard, Rate controlled Gastrointestinal system: Abdomen is nondistended, soft and nontender. Normal bowel sounds heard. Extremities: No cyanosis, clubbing, edema  Central nervous system: Wakes up slightly, extremely slow to respond.  Poor historian.  No focal neurological deficits. Moving extremities Skin: No rashes, lesions or ulcers Psychiatry:  Flat affect.  Not agitated.   Data Reviewed: I have personally reviewed following labs and imaging studies  CBC: Recent Labs  Lab 05/03/24 0720 05/04/24 0811 05/04/24 2345  05/05/24 0343  WBC 6.8 11.4* 9.8 9.3  NEUTROABS  --   --   --  6.6  HGB 12.0 10.0* 9.8* 9.0*  HCT 37.1 32.0* 30.8* 27.9*  MCV 95.4 98.2 100.3* 100.0  PLT 301 300 247 237   Basic Metabolic Panel: Recent Labs  Lab 05/03/24 0720 05/04/24 1849 05/04/24 2345 05/05/24 0343  NA 143 141 141 140  K 4.0 4.1 3.5 3.6  CL 107 105 104 106  CO2 24 27 27 25   GLUCOSE 94 107* 97 97  BUN 16 11 10 11   CREATININE 0.50 0.69 0.74 0.52  CALCIUM  9.1 9.1 9.3 8.8*   GFR: Estimated Creatinine Clearance: 60.1 mL/min (by C-G formula based on SCr of 0.52 mg/dL). Liver Function Tests: Recent Labs  Lab 05/04/24 2313 05/04/24 2345  AST 28 28  ALT 11 10  ALKPHOS 140* 130*  BILITOT 0.3 0.3  PROT 6.1* 6.0*  ALBUMIN 4.2 4.1   No results for input(s): LIPASE, AMYLASE in the last 168 hours. Recent Labs  Lab 05/05/24 0343  AMMONIA 31   Coagulation Profile: No results for input(s): INR, PROTIME in the last 168 hours. Cardiac Enzymes: No results for input(s): CKTOTAL, CKMB, CKMBINDEX, TROPONINI in the last 168 hours. BNP (last 3 results) No results for input(s): PROBNP in the last 8760 hours. HbA1C: No results for input(s): HGBA1C in the last 72 hours. CBG: Recent Labs  Lab 05/04/24 2335  GLUCAP 96   Lipid Profile: No results for input(s): CHOL, HDL, LDLCALC, TRIG, CHOLHDL, LDLDIRECT in the last 72 hours. Thyroid  Function Tests: Recent Labs    05/05/24 0343  TSH 4.630*   Anemia Panel: No results for input(s): VITAMINB12, FOLATE, FERRITIN, TIBC, IRON, RETICCTPCT in the last 72 hours. Sepsis Labs: Recent Labs  Lab 05/04/24 2345  LATICACIDVEN 1.3    Recent Results (from the past 240 hours)  Surgical pcr screen     Status: None   Collection Time: 05/02/24  4:49 PM   Specimen: Nasal Mucosa; Nasal Swab  Result Value Ref Range Status   MRSA, PCR NEGATIVE NEGATIVE Final   Staphylococcus aureus NEGATIVE NEGATIVE Final    Comment: (NOTE) The  Xpert SA Assay (FDA approved for NASAL specimens in patients 77 years of age and older), is one component of a comprehensive surveillance program. It is not intended to diagnose infection nor to guide or monitor treatment. Performed at [facility], [address], [place], [place] [address]          Radiology Studies: MR BRAIN WO CONTRAST Result Date: 05/05/2024 EXAM: MRI BRAIN WITHOUT CONTRAST 05/05/2024 11:01:03 AM TECHNIQUE: Multiplanar multisequence MRI of the head/brain was performed without the administration of intravenous contrast. COMPARISON: CT of the head dated 05/04/2024. CLINICAL HISTORY: Mental status change, unknown cause. FINDINGS: BRAIN AND VENTRICLES: No acute infarct. No intracranial hemorrhage. No mass. No midline shift. No hydrocephalus. There is mild periventricular and subcortical white matter disease. There is patchy increased T2 signal within the pons. The sella is unremarkable. Normal flow voids. ORBITS: The patient is status post bilateral lens replacement. SINUSES AND MASTOIDS: No acute abnormality. BONES AND SOFT TISSUES: Normal marrow signal. No acute soft tissue abnormality. IMPRESSION: 1. No acute intracranial abnormality. 2. Mild periventricular and subcortical white matter disease. 3. Patchy increased T2 signal within the pons. Electronically  signed by: Evalene Coho MD 05/05/2024 11:09 AM EDT RP Workstation: [id number]   CT ANGIO HEAD NECK W WO CM (CODE STROKE) Result Date: 05/05/2024 EXAM: CTA HEAD AND NECK WITH AND WITHOUT 05/05/2024 12:34:10 AM TECHNIQUE: CTA of the head and neck was performed with and without the administration of 75 mL of iohexol  (OMNIPAQUE ) 350 MG/ML injection. Multiplanar 2D and/or 3D reformatted images are provided for review. Automated exposure control, iterative reconstruction, and/or weight based adjustment of the mA/kV was utilized to reduce the radiation dose to as low as reasonably achievable. Stenosis of the  internal carotid arteries measured using NASCET criteria. COMPARISON: Comparison with a CT from earlier the same day. CLINICAL HISTORY: Neuro deficit, acute, stroke suspected. Code stroke evaluation, CT head without code stroke already done, acute encephalopathy, no medications in several hours, patient very lethargic and hard to arouse. FINDINGS: LIMITATIONS/ARTIFACTS: Examination degraded by motion artifact. Evaluation of the intracranial circulation limited by motion. CTA NECK: AORTIC ARCH AND ARCH VESSELS: Mild atherosclerotic change about the aortic arch. No dissection or arterial injury. No significant stenosis of the brachiocephalic or subclavian arteries. CERVICAL CAROTID ARTERIES: Both internal carotid arteries patent to the turmini without stenosis. No dissection or arterial injury. No hemodynamically significant stenosis by NASCET criteria. CERVICAL VERTEBRAL ARTERIES: No dissection, arterial injury, or significant stenosis. Both V4 segments patent without visible stenosis. LUNGS AND MEDIASTINUM: Mild subsegmental atelectatic changes noted within the visualized lungs. SOFT TISSUES: No acute abnormality. BONES: No acute abnormality. CTA HEAD: ANTERIOR CIRCULATION: Both internal carotid arteries patent to the turmini without stenosis. A1 segments, anterior carotid complex, and anterior cerebral arteries widely patent. No M1 stenosis or occlusion. Distal anterior carotid arteries perfused and symmetric. No aneurysm. POSTERIOR CIRCULATION: Both PICA patent. Basilar mildly diminutive but grossly patent without visible stenosis. Superior cerebral arteries patent bilaterally. Fetal type origin of the PCAs bilaterally. Both PCAs patent without stenosis. No significant stenosis of the vertebral arteries. No aneurysm. OTHER: No dural venous sinus thrombosis on this non-dedicated study. IMPRESSION: 1. Motion degraded exam. 2. Negative CTA for large vessel occlusion or other emergent findings. No hemodynamically  significant or correctable stenosis. 3. Aortic atherosclerosis. Electronically signed by: Morene Hoard MD 05/05/2024 01:02 AM EDT RP Workstation: [id number]   CT HEAD CODE STROKE WO CONTRAST Result Date: 05/04/2024 EXAM: CT HEAD WITHOUT CONTRAST 05/04/2024 11:49:00 PM TECHNIQUE: CT of the head was performed without the administration of intravenous contrast. Automated exposure control, iterative reconstruction, and/or weight based adjustment of the mA/kV was utilized to reduce the radiation dose to as low as reasonably achievable. COMPARISON: Comparison made with prior study from 03/15/2021. CLINICAL HISTORY: Neuro deficit, acute, stroke suspected. Lethergic, hard to wake up, no medications in 5 hours. FINDINGS: BRAIN AND VENTRICLES: No acute hemorrhage. No evidence of acute infarct. Generalized age related atrophy with mild chronic microvascular ischemic disease. No hydrocephalus. No extra-axial collection. No mass effect or midline shift. ORBITS: No acute abnormality. SINUSES: No acute abnormality. SOFT TISSUES AND SKULL: No acute soft tissue abnormality. No skull fracture. Aspects is 10. IMPRESSION: 1. No acute intracranial abnormality. 2. Aspects is 10. 3. Findings communicated by telephone to lavanda horns at 11:56 pm on 05/04/2024. Electronically signed by: Morene Hoard MD 05/04/2024 11:58 PM EDT RP Workstation: [id number]   DG CHEST PORT 1 VIEW Result Date: 05/04/2024 CLINICAL DATA:  Difficulty breathing, shortness of breath EXAM: PORTABLE CHEST 1 VIEW COMPARISON:  01/01/2024 FINDINGS: Heart and mediastinal contours within normal limits. Left lung clear. Airspace opacity in the right lower lobe. No  effusions. No acute bony abnormality. IMPRESSION: Right lower lobe airspace opacity could reflect atelectasis or pneumonia. Electronically Signed   By: Franky Crease M.D.   On: 05/04/2024 23:47   DG HIP UNILAT W OR W/O PELVIS 2-3 VIEWS LEFT Result Date: 05/03/2024 CLINICAL DATA:  Postop.   Periprosthetic fracture. EXAM: DG HIP (WITH OR WITHOUT PELVIS) 2-3V*L* COMPARISON:  Preoperative imaging FINDINGS: Revision of left hip arthroplasty in expected alignment. Cerclage wire fixation of periprosthetic fracture, fracture line not well demonstrated by radiograph. Recent postsurgical change includes air and edema in the soft tissues. IMPRESSION: Revision of left hip arthroplasty in expected alignment. Cerclage wire fixation of periprosthetic fracture, known fracture is not well demonstrated by radiograph. Electronically Signed   By: Andrea Gasman M.D.   On: 05/03/2024 18:23   DG HIP UNILAT WITH PELVIS 1V LEFT Result Date: 05/03/2024 CLINICAL DATA:  Elective surgery. EXAM: DG HIP (WITH OR WITHOUT PELVIS) 1V*L* COMPARISON:  Radiograph and CT 04/25/2024 FINDINGS: Seven fluoroscopic spot views of the hip submitted from the operating room. Imaging obtained during revision exchange of femoral component of hip arthroplasty with a cerclage wire about the proximal femur. Fluoroscopy time 6 seconds. Dose 0.57 mGy. IMPRESSION: Intraoperative fluoroscopy during revision of left hip arthroplasty. Electronically Signed   By: Andrea Gasman M.D.   On: 05/03/2024 18:22   DG C-Arm 1-60 Min-No Report Result Date: 05/03/2024 Fluoroscopy was utilized by the requesting physician.  No radiographic interpretation.   DG C-Arm 1-60 Min-No Report Result Date: 05/03/2024 Fluoroscopy was utilized by the requesting physician.  No radiographic interpretation.        Scheduled Meds:  aspirin  EC  81 mg Oral BID   [START ON 05/06/2024] cefadroxil  500 mg Oral BID   enoxaparin (LOVENOX) injection  40 mg Subcutaneous Q24H   folic acid  1 mg Oral Daily   levETIRAcetam  750 mg Intravenous Q12H   levothyroxine   125 mcg Oral QAC breakfast   mirabegron ER  50 mg Oral Daily   multivitamin with minerals  1 tablet Oral Daily   [START ON 05/06/2024] pneumococcal 20-valent conjugate vaccine  0.5 mL Intramuscular  Tomorrow-1000   thiamine  100 mg Oral Daily   Or   thiamine  100 mg Intravenous Daily   Continuous Infusions:   ceFAZolin (ANCEF) IV 2 g (05/05/24 0600)          Sophie Mao, MD [facility] 05/05/2024, 11:22 AM

## 2024-05-05 NOTE — Progress Notes (Signed)
 Physical Therapy Treatment Patient Details Name: Megan Singleton MRN: [id number] DOB: 07-04-1951 Today's Date: 05/05/2024   History of Present Illness Pt s/p L THR rev 2* proximal femur periprosthetic fx.  Pt wtih hx of Sz, fibromyalgia, RLS, ETOH abuse and L THR 04/07/24    PT Comments  Pt cooperative but requiring encouragement, increased time, and significant assist for most tasks.  Pt limited by pain and fatigue.    If plan is discharge home, recommend the following: A lot of help with walking and/or transfers;A lot of help with bathing/dressing/bathroom;Assistance with cooking/housework;Assist for transportation;Help with stairs or ramp for entrance   Can travel by private vehicle        Equipment Recommendations  None recommended by PT    Recommendations for Other Services OT consult     Precautions / Restrictions Precautions Precautions: Fall;Posterior Hip Precaution Booklet Issued: Yes (comment) Recall of Precautions/Restrictions: Impaired Precaution/Restrictions Comments: Pt recalls only 1/3 THP - reviewed all THP Restrictions Weight Bearing Restrictions Per Provider Order: Yes LLE Weight Bearing Per Provider Order: Partial weight bearing LLE Partial Weight Bearing Percentage or Pounds: 50%     Mobility  Bed Mobility Overal bed mobility: Needs Assistance Bed Mobility: Sit to Supine     Supine to sit: Mod assist, +2 for physical assistance, +2 for safety/equipment, Used rails Sit to supine: Mod assist, +2 for physical assistance, +2 for safety/equipment   General bed mobility comments: Increased time, cues for sequence and use of R LE to self asisst; physical assist to manage L LE, to control trunk and to complete rotation with use of bed pad    Transfers Overall transfer level: Needs assistance Equipment used: Rolling walker (2 wheels) Transfers: Sit to/from Stand, Bed to chair/wheelchair/BSC Sit to Stand: Min assist, Mod assist, +2 safety/equipment, From  elevated surface   Step pivot transfers: Min assist, Mod assist, +2 physical assistance, +2 safety/equipment, From elevated surface       General transfer comment: Cues for LE management, use of UEs to self assist, and adherence to THP.  Step pvt with RW  recliner to bedside    Ambulation/Gait               General Gait Details: Step pvt chair to bed only - pt ltd by pain/fatigue   Stairs             Wheelchair Mobility     Tilt Bed    Modified Rankin (Stroke Patients Only)       Balance Overall balance assessment: History of Falls, Needs assistance Sitting-balance support: No upper extremity supported, Feet supported Sitting balance-Leahy Scale: Fair     Standing balance support: Bilateral upper extremity supported Standing balance-Leahy Scale: Poor                              Communication Communication Communication: No apparent difficulties  Cognition Arousal: Alert Behavior During Therapy: Anxious, Flat affect   PT - Cognitive impairments: Problem solving, Safety/Judgement                         Following commands: Impaired Following commands impaired: Only follows one step commands consistently    Cueing Cueing Techniques: Verbal cues, Gestural cues  Exercises Total Joint Exercises Ankle Circles/Pumps: AROM, Both, 15 reps, Supine    General Comments        Pertinent Vitals/Pain Pain Assessment Pain Assessment: Faces Faces Pain Scale:  Hurts even more Pain Location: L hip with movement Pain Descriptors / Indicators: Aching, Grimacing, Guarding, Sore Pain Intervention(s): Limited activity within patient's tolerance, Monitored during session, Premedicated before session, Ice applied    Home Living                          Prior Function            PT Goals (current goals can now be found in the care plan section) Acute Rehab PT Goals Patient Stated Goal: to go home PT Goal Formulation: With  patient Time For Goal Achievement: 05/10/24 Potential to Achieve Goals: Good Progress towards PT goals: Progressing toward goals    Frequency    7X/week      PT Plan      Co-evaluation              AM-PAC PT 6 Clicks Mobility   Outcome Measure  Help needed turning from your back to your side while in a flat bed without using bedrails?: A Lot Help needed moving from lying on your back to sitting on the side of a flat bed without using bedrails?: A Lot Help needed moving to and from a bed to a chair (including a wheelchair)?: A Lot Help needed standing up from a chair using your arms (e.g., wheelchair or bedside chair)?: A Lot Help needed to walk in hospital room?: Total Help needed climbing 3-5 steps with a railing? : Total 6 Click Score: 10    End of Session Equipment Utilized During Treatment: Gait belt Activity Tolerance: Patient limited by fatigue;Patient limited by pain Patient left: in bed;with call bell/phone within reach;with bed alarm set;with family/visitor present Nurse Communication: Mobility status PT Visit Diagnosis: Pain;Difficulty in walking, not elsewhere classified (R26.2) Pain - Right/Left: Left Pain - part of body: Hip     Time: 8641-8586 PT Time Calculation (min) (ACUTE ONLY): 15 min  Charges:    $Therapeutic Activity: 8-22 mins PT General Charges $$ ACUTE PT VISIT: 1 Visit                     [facility] PT Acute Rehabilitation Services Office [phone]    Paola Aleshire 05/05/2024, 4:25 PM

## 2024-05-05 NOTE — Progress Notes (Signed)
  Echocardiogram 2D Echocardiogram has been performed.  Tinnie FORBES Gosling RDCS 05/05/2024, 2:36 PM

## 2024-05-05 NOTE — Evaluation (Addendum)
 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 Will continue efforts)   Nicolas Emmie Caldron 05/05/2024, 10:17 AM

## 2024-05-05 NOTE — Significant Event (Signed)
 Rapid Response Event Note   Reason for Call :  CODE STROKE CALLED BY DR. [facility] ON 05/04/24 @ 2326  Initial Focused Assessment:  Patient is difficult to arouse during hospitalist consult and therefore, Dr. Fernand instructed unit bedside RN and charge nurse to call a code stroke. On call provider present when I arrived to manage CODE STROKE as directed by Dr. Fernand who has left the bedside after initial assessment. Initiated tele-neuro assessment with E-link Camera at bedside. IV team placed US -guided IV and pulled blood for CBG and ordered labs. Transported patient emergently to CT. Patient continued to be difficult to wake even through IV placement. After transferring her to CT machine table, patient became more alert. Patient remained disoriented to place, time, and situation. Patient had difficulty following commands and kept closing her eyes during attempts to complete NIH assessment.  Bilateral wrist restraints were removed to properly perform NIH assessment and remained untied when patient returned to room. Obtained CT Head and CTA of head/neck.   Transferred back to room 1422.  Plan of Care:  Maintain current level of care and implement full delirium precautions. Limit sedating medications. Bedside RN to re-evaluate need for restraints and discontinue if appropriate as this may contribute to delirium. Patient spouse at bedside and updated by provider.   Event Summary:   MD Notified: Lavanda Horns, NP Hospitalist Coverage and Dr. Laurence Tele-Neurology Call Time: 2326 Arrival Time: 2328 End Time: 0036  Alan LITTIE [place], RN

## 2024-05-05 NOTE — Progress Notes (Signed)
 Subjective:  Patient has been moved to higher level of care.  Consulted hospitalist yesterday due to increased agitation, worsening delirium, and demands for alcohol. Hx of alcohol abuse.  Notified by nursing staff, initially placed patient on CIWA and withdrawal protocol.  Some improvement in agitation with ativan  however still was significantly delirious.  Per note review, patient became somnolent shortly after transfer, and code stroke was initiated.  See their notes for further details, workup ongoing.  Husband at bedside this morning. Limited participation with PT due to status.  Patient able to grunt and nod head on occasion to questions, though overall groggy.  History and examination limited due to same.  Yesterday's total administered Morphine Milligram Equivalents: 52.5   Objective:   VITALS:   Vitals:   05/04/24 2101 05/04/24 2335 05/05/24 0034 05/05/24 0445  BP: (!) 116/50 (!) 128/48 (!) 131/43 (!) 110/46  Pulse:  82 94 77  Resp: 20  18 18   Temp: 98.4 F (36.9 C) 100.3 F (37.9 C) 99.2 F (37.3 C) 97.8 F (36.6 C)  TempSrc: Oral Oral Oral Oral  SpO2: 90% 98% 95% 95%  Weight:      Height:        Resting in bed comfortably in NAD, minimal grunt or nodding response to some questions Wiggles toes appropriately Unable to participate appropriately with sensorineuro exam Dressing CDI Wound vac holding suction, no fluid collected in cannister    Lab Results  Component Value Date   WBC 9.3 05/05/2024   HGB 9.0 (L) 05/05/2024   HCT 27.9 (L) 05/05/2024   MCV 100.0 05/05/2024   PLT 237 05/05/2024   BMET    Component Value Date/Time   NA 140 05/05/2024 0343   K 3.6 05/05/2024 0343   CL 106 05/05/2024 0343   CO2 25 05/05/2024 0343   GLUCOSE 97 05/05/2024 0343   GLUCOSE 121 01/16/2009 0000   BUN 11 05/05/2024 0343   CREATININE 0.52 05/05/2024 0343   CALCIUM  8.8 (L) 05/05/2024 0343   GFRNONAA >60 05/05/2024 0343      Xray: THA revision femoral  components good position no adverse features.  Assessment/Plan: 2 Days Post-Op   Principal Problem:   Periprosthetic fracture around internal prosthetic left hip joint, initial encounter [facility]) Active Problems:   Acute encephalopathy   Narrative: Workup with hospitalist and neurology ongoing.  Spoke with Dr. Deretha yesterday evening after seeing the patient, low suspicion for primary cause of alcohol withdrawal though unclear history of current ETOH daily consumption.  Currently assessing for stroke vs encephalopathy vs delirium.  Limited PT participation during workup and status, which I believe is expected due to safety concerns.  Continue WB restrictions and posterior hip precautions.  Hospitalist now primary, though ortho will continue to follow closely.     S/p L THA revision femoral component for fracture 05/03/24  Post op recs: WB: 50% partial weightbearing left lower extremity and posterior precautions x 6 weeks Abx: ancef Ancef in house and discharged on cefadroxil for extended antibiotic prophylaxis. Imaging: PACU pelvis Xray Dressing: Prevena wound VAC to keep intact for 1 week DVT prophylaxis: Aspirin  81BID starting POD1 Follow up: 2 weeks after surgery for a wound check with Dr. Edna at [facility].  Address: [address], [place], [place] [address]  Office Phone: [phone]    Bernarda CHRISTELLA Mclean 05/05/2024, 7:18 AM    Contact information:   Weekdays 7am-5pm epic message Dr. Edna, or call office for patient  follow up: [phone] After hours and holidays please check [email or web address] for group call information for [facility]

## 2024-05-05 NOTE — Progress Notes (Signed)
 Routine EEG completed, results pending Neurology review and interpretation

## 2024-05-05 NOTE — Progress Notes (Signed)
   Patient Name: Kirstie J Alguire           DOB: 07-Mar-1951  MRN: [id number]      Admission Date: 05/02/2024  Attending Provider: Edna Toribio LABOR, MD  Primary Diagnosis: Periprosthetic fracture around internal prosthetic left hip joint, initial encounter (HCC)   Level of care: Progressive   CODE STROKE  Notified by bedside RN of orders placed by admitting MD to active CODE STROKE due to worsening mentation.  Over the past 24 hrs, pt has received meds including: Gabapentin, Keppra, Robaxin, oxycodone , Dilaudid , Ativan , Ambien Hx- alcohol abuse, bipolar, anxiety, depression, and seizure   No last known well known.  Mentation has been fluctuating. Last seen by MD around 7 pm when pt was awake, anxious, and restless in bed. Now, pt is unresponsive.  No mind- altering meds given since 3 pm. Code stroke activated at 2326   Assessment:  Vitals:   05/04/24 2335 05/05/24 0034  BP: (!) 128/48 (!) 131/43  Pulse: 82 94  Resp:  18  Temp: 100.3 F (37.9 C) 99.2 F (37.3 C)  SpO2: 98% 95%    Hemodynamically stable. CBG 90's.   Initial NIH assessment unable to be completed due to somnolent level of consciousness. However, while completing head CT, pt became more alert and verbal. Following some simple commands when redirected.  Requires stimulation to stay awake, disoriented.  Speech is clear. PERRLA.  NIHSS- 3    Imaging CT head w/o contrast- No acute intracranial abnormality.   CTA head neck- Negative for large vessel occlusion or other emergent findings. No hemodynamically significant or correctable stenosis.   Plan Q4 Neuro checks N.p.o. - bedside Swallow Eval Dr. Laurence (neurology) recommending CTA head for LVO.  If no LVO, then proceed with MRI brain and EEG for further workup  Sedating medications Delirium precautions Seizure precautions Labs-CMP, CBC, VBG, ammonia, UA, TSH, lactic acid   Lavanda Horns, DNP, ACNPC- AG [facility]

## 2024-05-06 DIAGNOSIS — M9702XA Periprosthetic fracture around internal prosthetic left hip joint, initial encounter: Secondary | ICD-10-CM | POA: Diagnosis not present

## 2024-05-06 LAB — COMPREHENSIVE METABOLIC PANEL WITH GFR
ALT: 6 U/L (ref 0–44)
AST: 20 U/L (ref 15–41)
Albumin: 3.5 g/dL (ref 3.5–5.0)
Alkaline Phosphatase: 111 U/L (ref 38–126)
Anion gap: 11 (ref 5–15)
BUN: 10 mg/dL (ref 8–23)
CO2: 25 mmol/L (ref 22–32)
Calcium: 9 mg/dL (ref 8.9–10.3)
Chloride: 105 mmol/L (ref 98–111)
Creatinine, Ser: 0.44 mg/dL (ref 0.44–1.00)
GFR, Estimated: >60 mL/min (ref >60)
Glucose, Bld: 107 mg/dL — ABNORMAL HIGH (ref 70–99)
Potassium: 3.5 mmol/L (ref 3.5–5.1)
Sodium: 141 mmol/L (ref 135–145)
Total Bilirubin: 0.3 mg/dL (ref 0.0–1.2)
Total Protein: 5.5 g/dL — ABNORMAL LOW (ref 6.5–8.1)

## 2024-05-06 LAB — CBC WITH DIFFERENTIAL/PLATELET
Abs Immature Granulocytes: 0.03 K/uL (ref 0.00–0.07)
Basophils Absolute: 0 K/uL (ref 0.0–0.1)
Basophils Relative: 1 %
Eosinophils Absolute: 0.1 K/uL (ref 0.0–0.5)
Eosinophils Relative: 2 %
HCT: 27.5 % — ABNORMAL LOW (ref 36.0–46.0)
Hemoglobin: 8.8 g/dL — ABNORMAL LOW (ref 12.0–15.0)
Immature Granulocytes: 0 %
Lymphocytes Relative: 21 %
Lymphs Abs: 1.7 K/uL (ref 0.7–4.0)
MCH: 31.7 pg (ref 26.0–34.0)
MCHC: 32 g/dL (ref 30.0–36.0)
MCV: 98.9 fL (ref 80.0–100.0)
Monocytes Absolute: 0.5 K/uL (ref 0.1–1.0)
Monocytes Relative: 7 %
Neutro Abs: 5.5 K/uL (ref 1.7–7.7)
Neutrophils Relative %: 69 %
Platelets: 225 K/uL (ref 150–400)
RBC: 2.78 MIL/uL — ABNORMAL LOW (ref 3.87–5.11)
RDW: 13.2 % (ref 11.5–15.5)
WBC: 7.9 K/uL (ref 4.0–10.5)
nRBC: 0 % (ref 0.0–0.2)

## 2024-05-06 LAB — LIPID PANEL
Cholesterol: 140 mg/dL (ref 0–200)
HDL: 47 mg/dL (ref >40)
LDL Cholesterol: 71 mg/dL (ref 0–99)
Total CHOL/HDL Ratio: 3 ratio
Triglycerides: 109 mg/dL (ref <150)
VLDL: 22 mg/dL (ref 0–40)

## 2024-05-06 LAB — MAGNESIUM: Magnesium: 2.4 mg/dL (ref 1.7–2.4)

## 2024-05-06 LAB — HEMOGLOBIN A1C
Hgb A1c MFr Bld: 4.7 % — ABNORMAL LOW (ref 4.8–5.6)
Mean Plasma Glucose: 88.19 mg/dL

## 2024-05-06 LAB — FOLATE: Folate: >20 ng/mL (ref >5.9)

## 2024-05-06 LAB — VITAMIN B12: Vitamin B-12: 807 pg/mL (ref 180–914)

## 2024-05-06 MED ORDER — BUSPIRONE HCL 10 MG PO TABS
10.0000 mg | ORAL_TABLET | Freq: Two times a day (BID) | ORAL | Status: DC
  Administered 2024-05-06 – 2024-05-08 (×4): 10 mg via ORAL
  Filled 2024-05-06 (×5): qty 1

## 2024-05-06 MED ORDER — LEVETIRACETAM 500 MG PO TABS
750.0000 mg | ORAL_TABLET | Freq: Two times a day (BID) | ORAL | Status: DC
  Administered 2024-05-07 – 2024-05-08 (×3): 750 mg via ORAL
  Filled 2024-05-06 (×4): qty 1

## 2024-05-06 MED ORDER — PRAMIPEXOLE DIHYDROCHLORIDE 1 MG PO TABS
1.5000 mg | ORAL_TABLET | Freq: Every day | ORAL | Status: DC
  Administered 2024-05-07 – 2024-05-08 (×2): 1.5 mg via ORAL
  Filled 2024-05-06 (×2): qty 2

## 2024-05-06 MED ORDER — SERTRALINE HCL 100 MG PO TABS
100.0000 mg | ORAL_TABLET | Freq: Every day | ORAL | Status: DC
  Administered 2024-05-06 – 2024-05-08 (×3): 100 mg via ORAL
  Filled 2024-05-06 (×3): qty 1

## 2024-05-06 MED ORDER — PRAMIPEXOLE DIHYDROCHLORIDE 1 MG PO TABS
3.0000 mg | ORAL_TABLET | Freq: Every evening | ORAL | Status: DC
  Administered 2024-05-06 – 2024-05-07 (×2): 3 mg via ORAL
  Filled 2024-05-06 (×3): qty 3

## 2024-05-06 MED ORDER — PRAMIPEXOLE DIHYDROCHLORIDE 1 MG PO TABS
1.5000 mg | ORAL_TABLET | Freq: Once | ORAL | Status: AC
  Administered 2024-05-06: 1.5 mg via ORAL
  Filled 2024-05-06: qty 2

## 2024-05-06 NOTE — Progress Notes (Signed)
 PROGRESS NOTE    Megan Singleton  FMW:[id number] DOB: Dec 26, 1950 DOA: 05/02/2024 PCP: Jolee Madelin Patch, MD   Brief Narrative:  73 y.o. year old female with past medical history of hypothyroidism, hyperlipidemia restless leg syndrome, seizure disorder, bipolar disorder, osteoarthritis with recent left hip arthroplasty who developed periprosthetic fracture and was admitted by the orthopedic surgery team for repair on 05/02/24 for repeat surgical intervention.  [facility] was consulted by orthopedic surgery on 05/04/2024 for altered mental status.  Patient continued to have altered mental status: Code stroke was called and patient was evaluated by teleneurology.  CT head negative for acute intracranial abnormality and CTA head and neck was negative for large vessel occlusion or other emergent findings.  MRI brain and EEG recommended by teleneurology.  Care was transferred to [facility] as primary team.  Assessment & Plan:   Acute encephalopathy: Unclear if this is acute metabolic or toxic encephalopathy versus if she had a seizure History of seizures -Patient had acute alteration of mental status on 05/04/2024 which continued to persist. -Code stroke was called and patient was evaluated by teleneurology.  CT head negative for acute intracranial abnormality and CTA head and neck was negative for large vessel occlusion or other emergent findings.  MRI brain showed no acute intracranial abnormality.  EEG did not show any seizures.  Care was transferred to [facility] as primary team. -Ammonia normal.  TSH only slightly elevated.  B12 and folic acid levels normal. - Mental status is much improved.  Monitor mental status.Fall precautions.  PT following.  Sedating medications are on hold -Currently on IV Keppra.  Will switch back to oral Keppra as mental status has much improved.  Periprosthetic fracture -Management as per orthopedics team  Leukocytosis - Resolved  Anemia of chronic disease -from chronic  illnesses.  Globin stable.  Monitor intermittently  Bipolar disorder MDD/GAD - Resume sertraline and buspirone.  Hypothyroidism-continue levothyroxine   Restless leg syndrome - Resume home regimen   DVT prophylaxis: Lovenox Code Status: Full Family Communication: Husband at bedside Disposition Plan: Status is: Inpatient Remains inpatient appropriate because: Of severity of illness    Consultants: Orthopedics.  Teleneurology  Procedures: As above  Antimicrobials: None   Subjective: Patient seen and examined at bedside.  Awake,  Feels anxious.  Complains of left hip pain.  No fever or vomiting reported.  Objective: Vitals:   05/05/24 0750 05/05/24 1323 05/05/24 2105 05/06/24 0448  BP: (!) 123/53 (!) 125/55 (!) 125/52 (!) 137/59  Pulse: 82 92 85 86  Resp: 17 17 20 20   Temp: 98.7 F (37.1 C) 98 F (36.7 C) 98.2 F (36.8 C) 98.3 F (36.8 C)  TempSrc: Oral   Oral  SpO2: 95% 97% 94% 95%  Weight:      Height:        Intake/Output Summary (Last 24 hours) at 05/06/2024 0900 Last data filed at 05/06/2024 0800 Gross per 24 hour  Intake 875.63 ml  Output --  Net 875.63 ml   Filed Weights   05/02/24 1642 05/03/24 1052  Weight: 67.7 kg 67.7 kg    Examination:  General: On room air.  No distress ENT/neck: No thyromegaly.  JVD is not elevated  respiratory: Decreased breath sounds at bases bilaterally with some crackles; no wheezing  CVS: S1-S2 heard, rate controlled currently Abdominal: Soft, nontender, slightly distended; no organomegaly, bowel sounds are heard Extremities: Trace lower extremity edema; no cyanosis  CNS: Awake and alert.  Answering some questions appropriately.  Still slightly slow to respond.  No focal neurologic deficit.  Moves extremities Lymph: No obvious lymphadenopathy Skin: No obvious ecchymosis/lesions  psych: Affect is mostly flat.  Not agitated currently. musculoskeletal: No obvious joint swelling/deformity    Data Reviewed: I have  personally reviewed following labs and imaging studies  CBC: Recent Labs  Lab 05/03/24 0720 05/04/24 0811 05/04/24 2345 05/05/24 0343 05/06/24 0401  WBC 6.8 11.4* 9.8 9.3 7.9  NEUTROABS  --   --   --  6.6 5.5  HGB 12.0 10.0* 9.8* 9.0* 8.8*  HCT 37.1 32.0* 30.8* 27.9* 27.5*  MCV 95.4 98.2 100.3* 100.0 98.9  PLT 301 300 247 237 225   Basic Metabolic Panel: Recent Labs  Lab 05/03/24 0720 05/04/24 1849 05/04/24 2345 05/05/24 0343 05/06/24 0401  NA 143 141 141 140 141  K 4.0 4.1 3.5 3.6 3.5  CL 107 105 104 106 105  CO2 24 27 27 25 25   GLUCOSE 94 107* 97 97 107*  BUN 16 11 10 11 10   CREATININE 0.50 0.69 0.74 0.52 0.44  CALCIUM  9.1 9.1 9.3 8.8* 9.0  MG  --   --   --   --  2.4   GFR: Estimated Creatinine Clearance: 60.1 mL/min (by C-G formula based on SCr of 0.44 mg/dL). Liver Function Tests: Recent Labs  Lab 05/04/24 2313 05/04/24 2345 05/06/24 0401  AST 28 28 20   ALT 11 10 6   ALKPHOS 140* 130* 111  BILITOT 0.3 0.3 0.3  PROT 6.1* 6.0* 5.5*  ALBUMIN 4.2 4.1 3.5   No results for input(s): LIPASE, AMYLASE in the last 168 hours. Recent Labs  Lab 05/05/24 0343  AMMONIA 31   Coagulation Profile: No results for input(s): INR, PROTIME in the last 168 hours. Cardiac Enzymes: No results for input(s): CKTOTAL, CKMB, CKMBINDEX, TROPONINI in the last 168 hours. BNP (last 3 results) No results for input(s): PROBNP in the last 8760 hours. HbA1C: Recent Labs    05/06/24 0401  HGBA1C 4.7*   CBG: Recent Labs  Lab 05/04/24 2335  GLUCAP 96   Lipid Profile: Recent Labs    05/06/24 0401  CHOL 140  HDL 47  LDLCALC 71  TRIG 109  CHOLHDL 3.0   Thyroid  Function Tests: Recent Labs    05/05/24 0343  TSH 4.630*   Anemia Panel: Recent Labs    05/06/24 0401  VITAMINB12 807  FOLATE >20.0   Sepsis Labs: Recent Labs  Lab 05/04/24 2345  LATICACIDVEN 1.3    Recent Results (from the past 240 hours)  Surgical pcr screen     Status: None    Collection Time: 05/02/24  4:49 PM   Specimen: Nasal Mucosa; Nasal Swab  Result Value Ref Range Status   MRSA, PCR NEGATIVE NEGATIVE Final   Staphylococcus aureus NEGATIVE NEGATIVE Final    Comment: (NOTE) The Xpert SA Assay (FDA approved for NASAL specimens in patients 51 years of age and older), is one component of a comprehensive surveillance program. It is not intended to diagnose infection nor to guide or monitor treatment. Performed at [facility], [address] [place], [place] [address]          Radiology Studies: ECHOCARDIOGRAM COMPLETE Result Date: 05/05/2024    ECHOCARDIOGRAM REPORT   Patient Name:   PEGGI YONO Date of Exam: 05/05/2024 Medical Rec #:  [id number]           Height:       64.0 in Accession #:    [id number]  Weight:       149.3 lb Date of Birth:  [birth year]           BSA:          1.728 m Patient Age:    72 years            BP:           125/55 mmHg Patient Gender: F                   HR:           88 bpm. Exam Location:  Inpatient Procedure: 2D Echo, Color Doppler and Cardiac Doppler (Both Spectral and Color            Flow Doppler were utilized during procedure). Indications:    Stroke I63.9  History:        Patient has prior history of Echocardiogram examinations, most                 recent 06/24/2022. Risk Factors:Hypertension and Dyslipidemia.  Sonographer:    Tinnie Gosling RDCS Referring Phys: [id number] SOPHIE MAO IMPRESSIONS  1. Left ventricular ejection fraction, by estimation, is 65 to 70%. The left ventricle has normal function. The left ventricle has no regional wall motion abnormalities. Left ventricular diastolic parameters were normal.  2. Right ventricular systolic function is normal. The right ventricular size is normal. Tricuspid regurgitation signal is inadequate for assessing PA pressure.  3. The mitral valve is normal in structure. Trivial mitral valve regurgitation. No evidence of mitral stenosis.  4. The aortic  valve was not well visualized. Unable to determine aortic valve morphology due to image quality. Aortic valve regurgitation is not visualized. No aortic stenosis is present.  5. The inferior vena cava is normal in size with greater than 50% respiratory variability, suggesting right atrial pressure of 3 mmHg.  6. Cannot exclude a small PFO. Comparison(s): A prior study was performed on 06/24/2022. No significant change from prior study. FINDINGS  Left Ventricle: Left ventricular ejection fraction, by estimation, is 65 to 70%. The left ventricle has normal function. The left ventricle has no regional wall motion abnormalities. The left ventricular internal cavity size was normal in size. There is  no left ventricular hypertrophy. Left ventricular diastolic parameters were normal. Right Ventricle: The right ventricular size is normal. No increase in right ventricular wall thickness. Right ventricular systolic function is normal. Tricuspid regurgitation signal is inadequate for assessing PA pressure. Left Atrium: Left atrial size was normal in size. Right Atrium: Right atrial size was normal in size. Pericardium: There is no evidence of pericardial effusion. Mitral Valve: The mitral valve is normal in structure. Mild to moderate mitral annular calcification. Trivial mitral valve regurgitation. No evidence of mitral valve stenosis. Tricuspid Valve: The tricuspid valve is normal in structure. Tricuspid valve regurgitation is not demonstrated. No evidence of tricuspid stenosis. Aortic Valve: The aortic valve was not well visualized. There is mild aortic valve annular calcification. Aortic valve regurgitation is not visualized. No aortic stenosis is present. Pulmonic Valve: The pulmonic valve was normal in structure. Pulmonic valve regurgitation is not visualized. No evidence of pulmonic stenosis. Aorta: The aortic root and ascending aorta are structurally normal, with no evidence of dilitation. Venous: The inferior vena  cava is normal in size with greater than 50% respiratory variability, suggesting right atrial pressure of 3 mmHg. IAS/Shunts: Cannot exclude a small PFO.  LEFT VENTRICLE PLAX 2D LVIDd:  4.80 cm   Diastology LVIDs:         3.20 cm   LV e' medial:    8.05 cm/s LV PW:         0.80 cm   LV E/e' medial:  7.6 LV IVS:        0.80 cm   LV e' lateral:   7.29 cm/s LVOT diam:     2.00 cm   LV E/e' lateral: 8.3 LV SV:         71 LV SV Index:   41 LVOT Area:     3.14 cm LV IVRT:       129 msec  RIGHT VENTRICLE             IVC RV S prime:     15.60 cm/s  IVC diam: 1.50 cm TAPSE (M-mode): 1.9 cm LEFT ATRIUM             Index        RIGHT ATRIUM           Index LA diam:        2.90 cm 1.68 cm/m   RA Area:     13.00 cm LA Vol (A2C):   33.6 ml 19.45 ml/m  RA Volume:   29.50 ml  17.07 ml/m LA Vol (A4C):   33.1 ml 19.16 ml/m LA Biplane Vol: 34.2 ml 19.79 ml/m  AORTIC VALVE LVOT Vmax:   117.00 cm/s LVOT Vmean:  84.700 cm/s LVOT VTI:    0.226 m  AORTA Ao Root diam: 3.20 cm Ao Asc diam:  3.00 cm MITRAL VALVE MV Area (PHT): 2.93 cm    SHUNTS MV E velocity: 60.80 cm/s  Systemic VTI:  0.23 m MV A velocity: 73.70 cm/s  Systemic Diam: 2.00 cm MV E/A ratio:  0.82 Emeline Calender Electronically signed by Emeline Calender Signature Date/Time: 05/05/2024/4:53:02 PM    Final    MR BRAIN WO CONTRAST Result Date: 05/05/2024 EXAM: MRI BRAIN WITHOUT CONTRAST 05/05/2024 11:01:03 AM TECHNIQUE: Multiplanar multisequence MRI of the head/brain was performed without the administration of intravenous contrast. COMPARISON: CT of the head dated 05/04/2024. CLINICAL HISTORY: Mental status change, unknown cause. FINDINGS: BRAIN AND VENTRICLES: No acute infarct. No intracranial hemorrhage. No mass. No midline shift. No hydrocephalus. There is mild periventricular and subcortical white matter disease. There is patchy increased T2 signal within the pons. The sella is unremarkable. Normal flow voids. ORBITS: The patient is status post bilateral lens  replacement. SINUSES AND MASTOIDS: No acute abnormality. BONES AND SOFT TISSUES: Normal marrow signal. No acute soft tissue abnormality. IMPRESSION: 1. No acute intracranial abnormality. 2. Mild periventricular and subcortical white matter disease. 3. Patchy increased T2 signal within the pons. Electronically signed by: Evalene Coho MD 05/05/2024 11:09 AM EDT RP Workstation: [id number]   EEG adult Result Date: 05/05/2024 Shelton Arlin KIDD, MD     05/06/2024  6:52 AM Patient Name: UNICE VANTASSEL MRN: [id number] Epilepsy Attending: Arlin KIDD Shelton Referring Physician/Provider: Andrez Chroman, NP Date: 05/05/2024 Duration: 32.24 mins Patient history: 73yo F with ams. EEG to evaluate for seizure Level of alertness: Awake AEDs during EEG study: LEV Technical aspects: This EEG study was done with scalp electrodes positioned according to the 10-20 International system of electrode placement. Electrical activity was reviewed with band pass filter of 1-70Hz , sensitivity of 7 uV/mm, display speed of 39mm/sec with a 60Hz  notched filter applied as appropriate. EEG data were recorded continuously and digitally stored.  Video monitoring was available and  reviewed as appropriate. Description: The posterior dominant rhythm consists of 8-9 Hz activity of moderate voltage (25-35 uV) seen predominantly in posterior head regions, symmetric and reactive to eye opening and eye closing. Hyperventilation and photic stimulation were not performed.   IMPRESSION: This study is within normal limits. No seizures or epileptiform discharges were seen throughout the recording. A normal interictal EEG does not exclude the diagnosis of epilepsy. Priyanka O Yadav   CT ANGIO HEAD NECK W WO CM (CODE STROKE) Result Date: 05/05/2024 EXAM: CTA HEAD AND NECK WITH AND WITHOUT 05/05/2024 12:34:10 AM TECHNIQUE: CTA of the head and neck was performed with and without the administration of 75 mL of iohexol  (OMNIPAQUE ) 350 MG/ML injection.  Multiplanar 2D and/or 3D reformatted images are provided for review. Automated exposure control, iterative reconstruction, and/or weight based adjustment of the mA/kV was utilized to reduce the radiation dose to as low as reasonably achievable. Stenosis of the internal carotid arteries measured using NASCET criteria. COMPARISON: Comparison with a CT from earlier the same day. CLINICAL HISTORY: Neuro deficit, acute, stroke suspected. Code stroke evaluation, CT head without code stroke already done, acute encephalopathy, no medications in several hours, patient very lethargic and hard to arouse. FINDINGS: LIMITATIONS/ARTIFACTS: Examination degraded by motion artifact. Evaluation of the intracranial circulation limited by motion. CTA NECK: AORTIC ARCH AND ARCH VESSELS: Mild atherosclerotic change about the aortic arch. No dissection or arterial injury. No significant stenosis of the brachiocephalic or subclavian arteries. CERVICAL CAROTID ARTERIES: Both internal carotid arteries patent to the turmini without stenosis. No dissection or arterial injury. No hemodynamically significant stenosis by NASCET criteria. CERVICAL VERTEBRAL ARTERIES: No dissection, arterial injury, or significant stenosis. Both V4 segments patent without visible stenosis. LUNGS AND MEDIASTINUM: Mild subsegmental atelectatic changes noted within the visualized lungs. SOFT TISSUES: No acute abnormality. BONES: No acute abnormality. CTA HEAD: ANTERIOR CIRCULATION: Both internal carotid arteries patent to the turmini without stenosis. A1 segments, anterior carotid complex, and anterior cerebral arteries widely patent. No M1 stenosis or occlusion. Distal anterior carotid arteries perfused and symmetric. No aneurysm. POSTERIOR CIRCULATION: Both PICA patent. Basilar mildly diminutive but grossly patent without visible stenosis. Superior cerebral arteries patent bilaterally. Fetal type origin of the PCAs bilaterally. Both PCAs patent without stenosis. No  significant stenosis of the vertebral arteries. No aneurysm. OTHER: No dural venous sinus thrombosis on this non-dedicated study. IMPRESSION: 1. Motion degraded exam. 2. Negative CTA for large vessel occlusion or other emergent findings. No hemodynamically significant or correctable stenosis. 3. Aortic atherosclerosis. Electronically signed by: Morene Hoard MD 05/05/2024 01:02 AM EDT RP Workstation: [id number]   CT HEAD CODE STROKE WO CONTRAST Result Date: 05/04/2024 EXAM: CT HEAD WITHOUT CONTRAST 05/04/2024 11:49:00 PM TECHNIQUE: CT of the head was performed without the administration of intravenous contrast. Automated exposure control, iterative reconstruction, and/or weight based adjustment of the mA/kV was utilized to reduce the radiation dose to as low as reasonably achievable. COMPARISON: Comparison made with prior study from 03/15/2021. CLINICAL HISTORY: Neuro deficit, acute, stroke suspected. Lethergic, hard to wake up, no medications in 5 hours. FINDINGS: BRAIN AND VENTRICLES: No acute hemorrhage. No evidence of acute infarct. Generalized age related atrophy with mild chronic microvascular ischemic disease. No hydrocephalus. No extra-axial collection. No mass effect or midline shift. ORBITS: No acute abnormality. SINUSES: No acute abnormality. SOFT TISSUES AND SKULL: No acute soft tissue abnormality. No skull fracture. Aspects is 10. IMPRESSION: 1. No acute intracranial abnormality. 2. Aspects is 10. 3. Findings communicated by telephone to lavanda horns at 11:56  pm on 05/04/2024. Electronically signed by: Morene Hoard MD 05/04/2024 11:58 PM EDT RP Workstation: [id number]   DG CHEST PORT 1 VIEW Result Date: 05/04/2024 CLINICAL DATA:  Difficulty breathing, shortness of breath EXAM: PORTABLE CHEST 1 VIEW COMPARISON:  01/01/2024 FINDINGS: Heart and mediastinal contours within normal limits. Left lung clear. Airspace opacity in the right lower lobe. No effusions. No acute bony  abnormality. IMPRESSION: Right lower lobe airspace opacity could reflect atelectasis or pneumonia. Electronically Signed   By: Franky Crease M.D.   On: 05/04/2024 23:47        Scheduled Meds:  aspirin  EC  81 mg Oral BID   busPIRone  10 mg Oral BID   cefadroxil  500 mg Oral BID   enoxaparin (LOVENOX) injection  40 mg Subcutaneous Q24H   folic acid  1 mg Oral Daily   levETIRAcetam  750 mg Intravenous Q12H   levothyroxine   125 mcg Oral QAC breakfast   mirabegron ER  50 mg Oral Daily   multivitamin with minerals  1 tablet Oral Daily   pneumococcal 20-valent conjugate vaccine  0.5 mL Intramuscular Tomorrow-1000   sertraline  100 mg Oral q AM   thiamine  100 mg Oral Daily   Or   thiamine  100 mg Intravenous Daily   Continuous Infusions:   ceFAZolin (ANCEF) IV 2 g (05/06/24 0544)          Sophie Mao, MD [facility] 05/06/2024, 9:00 AM

## 2024-05-06 NOTE — Progress Notes (Signed)
 Orthopaedic Trauma Service Progress Note Weekend Coverage  Patient ID: Megan Singleton MRN: [id number] DOB/AGE: 1951/06/13 73 y.o.  Subjective:  Doing ok this afternoon Did not work with therapy today due to nausea  No acute ortho issues  Did not ambulate yesterday, did transfer to chair    ROS As above  Today's  total administered Morphine Milligram Equivalents: 37.5 Yesterday's total administered Morphine Milligram Equivalents: 30  Objective:   VITALS:   Vitals:   05/05/24 1323 05/05/24 2105 05/06/24 0448 05/06/24 1347  BP: (!) 125/55 (!) 125/52 (!) 137/59 139/72  Pulse: 92 85 86 91  Resp: 17 20 20 16   Temp: 98 F (36.7 C) 98.2 F (36.8 C) 98.3 F (36.8 C) 98.2 F (36.8 C)  TempSrc:   Oral Oral  SpO2: 97% 94% 95% 97%  Weight:      Height:        Estimated body mass index is 25.63 kg/m as calculated from the following:   Height as of this encounter: 5' 4 (1.626 m).   Weight as of this encounter: 67.7 kg.   Intake/Output      10/17 0701 10/18 0700 10/18 0701 10/19 0700   P.O. 270 360   IV Piggyback 365.6    Total Intake(mL/kg) 635.6 (9.4) 360 (5.3)   Urine (mL/kg/hr)     Stool     Total Output     Net +635.6 +360        Urine Occurrence 1 x      LABS  Results for orders placed or performed during the hospital encounter of 05/02/24 (from the past 24 hours)  Urinalysis, Routine w reflex microscopic -Urine, Clean Catch     Status: Abnormal   Collection Time: 05/05/24  9:41 PM  Result Value Ref Range   Color, Urine YELLOW YELLOW   APPearance CLEAR CLEAR   Specific Gravity, Urine 1.026 1.005 - 1.030   pH 5.0 5.0 - 8.0   Glucose, UA NEGATIVE NEGATIVE mg/dL   Hgb urine dipstick NEGATIVE NEGATIVE   Bilirubin Urine NEGATIVE NEGATIVE   Ketones, ur 20 (A) NEGATIVE mg/dL   Protein, ur NEGATIVE NEGATIVE mg/dL   Nitrite NEGATIVE NEGATIVE   Leukocytes,Ua NEGATIVE NEGATIVE  CBC  with Differential/Platelet     Status: Abnormal   Collection Time: 05/06/24  4:01 AM  Result Value Ref Range   WBC 7.9 4.0 - 10.5 K/uL   RBC 2.78 (L) 3.87 - 5.11 MIL/uL   Hemoglobin 8.8 (L) 12.0 - 15.0 g/dL   HCT 72.4 (L) 63.9 - 53.9 %   MCV 98.9 80.0 - 100.0 fL   MCH 31.7 26.0 - 34.0 pg   MCHC 32.0 30.0 - 36.0 g/dL   RDW 86.7 88.4 - 84.4 %   Platelets 225 150 - 400 K/uL   nRBC 0.0 0.0 - 0.2 %   Neutrophils Relative % 69 %   Neutro Abs 5.5 1.7 - 7.7 K/uL   Lymphocytes Relative 21 %   Lymphs Abs 1.7 0.7 - 4.0 K/uL   Monocytes Relative 7 %   Monocytes Absolute 0.5 0.1 - 1.0 K/uL   Eosinophils Relative 2 %   Eosinophils Absolute 0.1 0.0 - 0.5 K/uL   Basophils Relative 1 %   Basophils Absolute 0.0 0.0 - 0.1 K/uL   Immature Granulocytes 0 %  Abs Immature Granulocytes 0.03 0.00 - 0.07 K/uL  Comprehensive metabolic panel with GFR     Status: Abnormal   Collection Time: 05/06/24  4:01 AM  Result Value Ref Range   Sodium 141 135 - 145 mmol/L   Potassium 3.5 3.5 - 5.1 mmol/L   Chloride 105 98 - 111 mmol/L   CO2 25 22 - 32 mmol/L   Glucose, Bld 107 (H) 70 - 99 mg/dL   BUN 10 8 - 23 mg/dL   Creatinine, Ser 9.55 0.44 - 1.00 mg/dL   Calcium  9.0 8.9 - 10.3 mg/dL   Total Protein 5.5 (L) 6.5 - 8.1 g/dL   Albumin 3.5 3.5 - 5.0 g/dL   AST 20 15 - 41 U/L   ALT 6 0 - 44 U/L   Alkaline Phosphatase 111 38 - 126 U/L   Total Bilirubin 0.3 0.0 - 1.2 mg/dL   GFR, Estimated >39 >39 mL/min   Anion gap 11 5 - 15  Magnesium     Status: None   Collection Time: 05/06/24  4:01 AM  Result Value Ref Range   Magnesium 2.4 1.7 - 2.4 mg/dL  Vitamin B12     Status: None   Collection Time: 05/06/24  4:01 AM  Result Value Ref Range   Vitamin B-12 807 180 - 914 pg/mL  Folate     Status: None   Collection Time: 05/06/24  4:01 AM  Result Value Ref Range   Folate >20.0 >5.9 ng/mL  Lipid panel     Status: None   Collection Time: 05/06/24  4:01 AM  Result Value Ref Range   Cholesterol 140 0 - 200 mg/dL    Triglycerides 890 <849 mg/dL   HDL 47 >59 mg/dL   Total CHOL/HDL Ratio 3.0 RATIO   VLDL 22 0 - 40 mg/dL   LDL Cholesterol 71 0 - 99 mg/dL  Hemoglobin J8r     Status: Abnormal   Collection Time: 05/06/24  4:01 AM  Result Value Ref Range   Hgb A1c MFr Bld 4.7 (L) 4.8 - 5.6 %   Mean Plasma Glucose 88.19 mg/dL     PHYSICAL EXAM:   Gen: Awake and more alert than it appears she previously has been.  Comfortable appearing in bed.  Answers questions quite well Lungs: Labored Cardiac: Giller Ext:       Left lower extremity  Incisional VAC with good seal and suction  No drainage in canister  Extremity is warm  Negligible swelling to the foot and lower leg  DPN, SPN, TN sensory functions are grossly intact  EHL, FHL, lesser toe motor functions are grossly intact  Ankle flexion, extension, inversion and eversion grossly intact  + DP pulse   Assessment/Plan: 3 Days Post-Op   Principal Problem:   Periprosthetic fracture around internal prosthetic left hip joint, initial encounter (HCC) Active Problems:   Acute encephalopathy   Anti-infectives (From admission, onward)    Start     Dose/Rate Route Frequency Ordered Stop   05/06/24 2200  cefadroxil (DURICEF) capsule 500 mg        500 mg Oral 2 times daily 05/03/24 1740 05/13/24 2159   05/03/24 2200  ceFAZolin (ANCEF) IVPB 2g/100 mL premix        2 g 200 mL/hr over 30 Minutes Intravenous Every 8 hours 05/03/24 1740 05/06/24 2159   05/03/24 0930  ceFAZolin (ANCEF) IVPB 2g/100 mL premix        2 g 200 mL/hr over 30 Minutes Intravenous On call  to O.R. 05/03/24 0630 05/03/24 1345     .  POD/HD#: 3 revision left total hip arthroplasty, femoral component only for periprosthetic fracture   Narrative: Acute encephalopathy, appears to be improving.  She is quite lucid this afternoon.  Workup to date has been pretty unremarkable.  She is mobilizing quite slowly with therapies.  Quite possibly will need SNF at discharge     S/p L  THA revision femoral component for fracture 05/03/24   Post op recs: WB: 50% partial weightbearing left lower extremity and posterior precautions x 6 weeks Abx: ancef Ancef in house and discharged on cefadroxil for extended antibiotic prophylaxis. Imaging: PACU pelvis Xray Dressing: Prevena wound VAC to keep intact for 1 week DVT prophylaxis: Aspirin  81BID starting POD1 Follow up: 2 weeks after surgery for a wound check with Dr. Edna at [facility].  Address: [address], [place], [place] [address]  Office Phone: [phone]  Francis MICAEL Mt, PA-C [phone] (C) 05/06/2024, 5:04 PM  [facility] [address] [place] [address] [phone] [phone] (F)    After 5pm and on the weekends please log on to Amion, go to orthopaedics and the look under the [facility] Call for the provider(s) on call. You can also call our office at [phone] and then follow the prompts to be connected to the call team.  Patient ID: Megan Singleton, female   DOB: 04/19/1951, 73 y.o.   MRN: [id number]

## 2024-05-06 NOTE — Progress Notes (Signed)
 OT Cancellation Note  Patient Details Name: Megan Singleton MRN: [id number] DOB: 24-Aug-1950   Cancelled Treatment:    Reason Eval/Treat Not Completed: Patient declined, no reason specified Pt declined therapy at this time. Stated I'll throw up on you if I move. Notified RN. OT will return as time allows and pt is appropriate.   [facility] OTR/L Acute Rehabilitation Services Office: [phone]   Megan Singleton 05/06/2024, 1:15 PM

## 2024-05-06 NOTE — Progress Notes (Signed)
 PT Cancellation Note  Patient Details Name: Megan Singleton MRN: [id number] DOB: 11/08/1950   Cancelled Treatment:     PT attempted am and pm but pt declined 2* nausea, If I move, I am going to throw up all over you.  Will follow.   Easton Sivertson 05/06/2024, 2:23 PM

## 2024-05-07 DIAGNOSIS — M9702XA Periprosthetic fracture around internal prosthetic left hip joint, initial encounter: Secondary | ICD-10-CM | POA: Diagnosis not present

## 2024-05-07 NOTE — Progress Notes (Addendum)
 PROGRESS NOTE    Megan Singleton  FMW:[id number] DOB: [birth year] DOA: 05/02/2024 PCP: Jolee Madelin Patch, MD   Brief Narrative:  73 y.o. year old female with past medical history of hypothyroidism, hyperlipidemia restless leg syndrome, seizure disorder, bipolar disorder, osteoarthritis with recent left hip arthroplasty who developed periprosthetic fracture and was admitted by the orthopedic surgery team for repair on 05/02/24 for repeat surgical intervention.  [facility] was consulted by orthopedic surgery on 05/04/2024 for altered mental status.  Patient continued to have altered mental status: Code stroke was called and patient was evaluated by teleneurology.  CT head negative for acute intracranial abnormality and CTA head and neck was negative for large vessel occlusion or other emergent findings.  MRI brain and EEG recommended by teleneurology.  Care was transferred to [facility] as primary team.  Assessment & Plan:   Acute encephalopathy: Unclear if this is acute metabolic or toxic encephalopathy versus if she had a seizure History of seizures -Patient had acute alteration of mental status on 05/04/2024 which continued to persist. -Code stroke was called and patient was evaluated by teleneurology.  CT head negative for acute intracranial abnormality and CTA head and neck was negative for large vessel occlusion or other emergent findings.  MRI brain showed no acute intracranial abnormality.  EEG did not show any seizures.  Care was transferred to [facility] as primary team. -Ammonia normal.  TSH only slightly elevated.  B12 and folic acid levels normal. - Mental status is much improved.  Monitor mental status. Fall precautions.  PT following.  Minimize sedating medications as much as possible.  Avoid Ativan . - Continue oral Keppra.  Periprosthetic fracture -Management as per orthopedics team.  Antibiotics as per orthopedics.  Leukocytosis - Resolved  Anemia of chronic disease -from chronic  illnesses.  Globin stable.  Monitor intermittently  Bipolar disorder MDD/GAD - Continue sertraline and buspirone.  Hypothyroidism-continue levothyroxine   Restless leg syndrome - Continue pramipexole   DVT prophylaxis: Lovenox Code Status: Full Family Communication: Husband at bedside Disposition Plan: Status is: Inpatient Remains inpatient appropriate because: Of severity of illness    Consultants: Orthopedics.  Teleneurology  Procedures: As above  Antimicrobials: Cefadroxil as per orthopedics  Subjective: Patient seen and examined at bedside.  Continues to have intermittent left hip pain.  Feels anxious intermittently.  No fever or vomiting reported. Objective: Vitals:   05/06/24 0448 05/06/24 1347 05/06/24 2146 05/07/24 0454  BP: (!) 137/59 139/72 (!) 147/84 (!) 154/72  Pulse: 86 91 92 88  Resp: 20 16 16 12   Temp: 98.3 F (36.8 C) 98.2 F (36.8 C) 98.5 F (36.9 C) 99.4 F (37.4 C)  TempSrc: Oral Oral Oral Oral  SpO2: 95% 97% 94% 96%  Weight:      Height:        Intake/Output Summary (Last 24 hours) at 05/07/2024 0831 Last data filed at 05/07/2024 0530 Gross per 24 hour  Intake 389.43 ml  Output 950 ml  Net -560.57 ml   Filed Weights   05/02/24 1642 05/03/24 1052  Weight: 67.7 kg 67.7 kg    Examination:  General: No acute distress.  Remains on room air.   ENT/neck: No JVD elevation or palpable neck masses noted  respiratory: Bilateral decreased breath sounds at bases with some scattered crackles  CVS: Rate mostly controlled; S1 and S2 are heard Abdominal: Soft, nontender, distended mildly; no organomegaly, bowel sounds are heard normally Extremities: No clubbing; mild lower extremity edema present CNS: Awake, still slow to respond but able to answer  some questions.  Still no focal deficits noted  lymph: No obvious palpable lymphadenopathy Skin: No obvious lesions/rashes psych: Anxious looking.  Not agitated    Data Reviewed: I have personally  reviewed following labs and imaging studies  CBC: Recent Labs  Lab 05/03/24 0720 05/04/24 0811 05/04/24 2345 05/05/24 0343 05/06/24 0401  WBC 6.8 11.4* 9.8 9.3 7.9  NEUTROABS  --   --   --  6.6 5.5  HGB 12.0 10.0* 9.8* 9.0* 8.8*  HCT 37.1 32.0* 30.8* 27.9* 27.5*  MCV 95.4 98.2 100.3* 100.0 98.9  PLT 301 300 247 237 225   Basic Metabolic Panel: Recent Labs  Lab 05/03/24 0720 05/04/24 1849 05/04/24 2345 05/05/24 0343 05/06/24 0401  NA 143 141 141 140 141  K 4.0 4.1 3.5 3.6 3.5  CL 107 105 104 106 105  CO2 24 27 27 25 25   GLUCOSE 94 107* 97 97 107*  BUN 16 11 10 11 10   CREATININE 0.50 0.69 0.74 0.52 0.44  CALCIUM  9.1 9.1 9.3 8.8* 9.0  MG  --   --   --   --  2.4   GFR: Estimated Creatinine Clearance: 60.1 mL/min (by C-G formula based on SCr of 0.44 mg/dL). Liver Function Tests: Recent Labs  Lab 05/04/24 2313 05/04/24 2345 05/06/24 0401  AST 28 28 20   ALT 11 10 6   ALKPHOS 140* 130* 111  BILITOT 0.3 0.3 0.3  PROT 6.1* 6.0* 5.5*  ALBUMIN 4.2 4.1 3.5   No results for input(s): LIPASE, AMYLASE in the last 168 hours. Recent Labs  Lab 05/05/24 0343  AMMONIA 31   Coagulation Profile: No results for input(s): INR, PROTIME in the last 168 hours. Cardiac Enzymes: No results for input(s): CKTOTAL, CKMB, CKMBINDEX, TROPONINI in the last 168 hours. BNP (last 3 results) No results for input(s): PROBNP in the last 8760 hours. HbA1C: Recent Labs    05/06/24 0401  HGBA1C 4.7*   CBG: Recent Labs  Lab 05/04/24 2335  GLUCAP 96   Lipid Profile: Recent Labs    05/06/24 0401  CHOL 140  HDL 47  LDLCALC 71  TRIG 109  CHOLHDL 3.0   Thyroid  Function Tests: Recent Labs    05/05/24 0343  TSH 4.630*   Anemia Panel: Recent Labs    05/06/24 0401  VITAMINB12 807  FOLATE >20.0   Sepsis Labs: Recent Labs  Lab 05/04/24 2345  LATICACIDVEN 1.3    Recent Results (from the past 240 hours)  Surgical pcr screen     Status: None   Collection  Time: 05/02/24  4:49 PM   Specimen: Nasal Mucosa; Nasal Swab  Result Value Ref Range Status   MRSA, PCR NEGATIVE NEGATIVE Final   Staphylococcus aureus NEGATIVE NEGATIVE Final    Comment: (NOTE) The Xpert SA Assay (FDA approved for NASAL specimens in patients 52 years of age and older), is one component of a comprehensive surveillance program. It is not intended to diagnose infection nor to guide or monitor treatment. Performed at [facility], [address] [place], [place] [address]          Radiology Studies: ECHOCARDIOGRAM COMPLETE Result Date: 05/05/2024    ECHOCARDIOGRAM REPORT   Patient Name:   Megan Singleton Date of Exam: 05/05/2024 Medical Rec #:  [id number]           Height:       64.0 in Accession #:    [id number]          Weight:  149.3 lb Date of Birth:  12/27/1950           BSA:          1.728 m Patient Age:    72 years            BP:           125/55 mmHg Patient Gender: F                   HR:           88 bpm. Exam Location:  Inpatient Procedure: 2D Echo, Color Doppler and Cardiac Doppler (Both Spectral and Color            Flow Doppler were utilized during procedure). Indications:    Stroke I63.9  History:        Patient has prior history of Echocardiogram examinations, most                 recent 06/24/2022. Risk Factors:Hypertension and Dyslipidemia.  Sonographer:    Tinnie Gosling RDCS Referring Phys: [id number] SOPHIE MAO IMPRESSIONS  1. Left ventricular ejection fraction, by estimation, is 65 to 70%. The left ventricle has normal function. The left ventricle has no regional wall motion abnormalities. Left ventricular diastolic parameters were normal.  2. Right ventricular systolic function is normal. The right ventricular size is normal. Tricuspid regurgitation signal is inadequate for assessing PA pressure.  3. The mitral valve is normal in structure. Trivial mitral valve regurgitation. No evidence of mitral stenosis.  4. The aortic valve was  not well visualized. Unable to determine aortic valve morphology due to image quality. Aortic valve regurgitation is not visualized. No aortic stenosis is present.  5. The inferior vena cava is normal in size with greater than 50% respiratory variability, suggesting right atrial pressure of 3 mmHg.  6. Cannot exclude a small PFO. Comparison(s): A prior study was performed on 06/24/2022. No significant change from prior study. FINDINGS  Left Ventricle: Left ventricular ejection fraction, by estimation, is 65 to 70%. The left ventricle has normal function. The left ventricle has no regional wall motion abnormalities. The left ventricular internal cavity size was normal in size. There is  no left ventricular hypertrophy. Left ventricular diastolic parameters were normal. Right Ventricle: The right ventricular size is normal. No increase in right ventricular wall thickness. Right ventricular systolic function is normal. Tricuspid regurgitation signal is inadequate for assessing PA pressure. Left Atrium: Left atrial size was normal in size. Right Atrium: Right atrial size was normal in size. Pericardium: There is no evidence of pericardial effusion. Mitral Valve: The mitral valve is normal in structure. Mild to moderate mitral annular calcification. Trivial mitral valve regurgitation. No evidence of mitral valve stenosis. Tricuspid Valve: The tricuspid valve is normal in structure. Tricuspid valve regurgitation is not demonstrated. No evidence of tricuspid stenosis. Aortic Valve: The aortic valve was not well visualized. There is mild aortic valve annular calcification. Aortic valve regurgitation is not visualized. No aortic stenosis is present. Pulmonic Valve: The pulmonic valve was normal in structure. Pulmonic valve regurgitation is not visualized. No evidence of pulmonic stenosis. Aorta: The aortic root and ascending aorta are structurally normal, with no evidence of dilitation. Venous: The inferior vena cava is  normal in size with greater than 50% respiratory variability, suggesting right atrial pressure of 3 mmHg. IAS/Shunts: Cannot exclude a small PFO.  LEFT VENTRICLE PLAX 2D LVIDd:         4.80 cm   Diastology LVIDs:  3.20 cm   LV e' medial:    8.05 cm/s LV PW:         0.80 cm   LV E/e' medial:  7.6 LV IVS:        0.80 cm   LV e' lateral:   7.29 cm/s LVOT diam:     2.00 cm   LV E/e' lateral: 8.3 LV SV:         71 LV SV Index:   41 LVOT Area:     3.14 cm LV IVRT:       129 msec  RIGHT VENTRICLE             IVC RV S prime:     15.60 cm/s  IVC diam: 1.50 cm TAPSE (M-mode): 1.9 cm LEFT ATRIUM             Index        RIGHT ATRIUM           Index LA diam:        2.90 cm 1.68 cm/m   RA Area:     13.00 cm LA Vol (A2C):   33.6 ml 19.45 ml/m  RA Volume:   29.50 ml  17.07 ml/m LA Vol (A4C):   33.1 ml 19.16 ml/m LA Biplane Vol: 34.2 ml 19.79 ml/m  AORTIC VALVE LVOT Vmax:   117.00 cm/s LVOT Vmean:  84.700 cm/s LVOT VTI:    0.226 m  AORTA Ao Root diam: 3.20 cm Ao Asc diam:  3.00 cm MITRAL VALVE MV Area (PHT): 2.93 cm    SHUNTS MV E velocity: 60.80 cm/s  Systemic VTI:  0.23 m MV A velocity: 73.70 cm/s  Systemic Diam: 2.00 cm MV E/A ratio:  0.82 Emeline Calender Electronically signed by Emeline Calender Signature Date/Time: 05/05/2024/4:53:02 PM    Final    MR BRAIN WO CONTRAST Result Date: 05/05/2024 EXAM: MRI BRAIN WITHOUT CONTRAST 05/05/2024 11:01:03 AM TECHNIQUE: Multiplanar multisequence MRI of the head/brain was performed without the administration of intravenous contrast. COMPARISON: CT of the head dated 05/04/2024. CLINICAL HISTORY: Mental status change, unknown cause. FINDINGS: BRAIN AND VENTRICLES: No acute infarct. No intracranial hemorrhage. No mass. No midline shift. No hydrocephalus. There is mild periventricular and subcortical white matter disease. There is patchy increased T2 signal within the pons. The sella is unremarkable. Normal flow voids. ORBITS: The patient is status post bilateral lens replacement.  SINUSES AND MASTOIDS: No acute abnormality. BONES AND SOFT TISSUES: Normal marrow signal. No acute soft tissue abnormality. IMPRESSION: 1. No acute intracranial abnormality. 2. Mild periventricular and subcortical white matter disease. 3. Patchy increased T2 signal within the pons. Electronically signed by: Evalene Coho MD 05/05/2024 11:09 AM EDT RP Workstation: [id number]   EEG adult Result Date: 05/05/2024 Shelton Arlin KIDD, MD     05/06/2024  6:52 AM Patient Name: Megan Singleton MRN: [id number] Epilepsy Attending: Arlin KIDD Shelton Referring Physician/Provider: Andrez Chroman, NP Date: 05/05/2024 Duration: 32.24 mins Patient history: 74yo F with ams. EEG to evaluate for seizure Level of alertness: Awake AEDs during EEG study: LEV Technical aspects: This EEG study was done with scalp electrodes positioned according to the 10-20 International system of electrode placement. Electrical activity was reviewed with band pass filter of 1-70Hz , sensitivity of 7 uV/mm, display speed of 70mm/sec with a 60Hz  notched filter applied as appropriate. EEG data were recorded continuously and digitally stored.  Video monitoring was available and reviewed as appropriate. Description: The posterior dominant rhythm consists of 8-9 Hz activity of  moderate voltage (25-35 uV) seen predominantly in posterior head regions, symmetric and reactive to eye opening and eye closing. Hyperventilation and photic stimulation were not performed.   IMPRESSION: This study is within normal limits. No seizures or epileptiform discharges were seen throughout the recording. A normal interictal EEG does not exclude the diagnosis of epilepsy. Priyanka O Yadav        Scheduled Meds:  aspirin  EC  81 mg Oral BID   busPIRone  10 mg Oral BID   cefadroxil  500 mg Oral BID   enoxaparin (LOVENOX) injection  40 mg Subcutaneous Q24H   folic acid  1 mg Oral Daily   levETIRAcetam  750 mg Oral BID   levothyroxine   125 mcg Oral QAC breakfast    mirabegron ER  50 mg Oral Daily   multivitamin with minerals  1 tablet Oral Daily   pneumococcal 20-valent conjugate vaccine  0.5 mL Intramuscular Tomorrow-1000   pramipexole   1.5 mg Oral Daily   pramipexole   3 mg Oral QPM   sertraline  100 mg Oral Daily   thiamine  100 mg Oral Daily   Or   thiamine  100 mg Intravenous Daily   Continuous Infusions:          Sophie Mao, MD [facility] 05/07/2024, 8:31 AM

## 2024-05-07 NOTE — Progress Notes (Signed)
 Physical Therapy Treatment Patient Details Name: Megan Singleton MRN: [id number] DOB: 09/08/1950 Today's Date: 05/07/2024   History of Present Illness Pt s/p L THR rev 2* proximal femur periprosthetic fx.  Pt wtih hx of Sz, fibromyalgia, RLS, ETOH abuse and L THR 04/07/24    PT Comments  Pt continues cooperative and with encouragement up to ambulate limited distance in hall.  Pt and spouse pleased with progress this date.   If plan is discharge home, recommend the following: A lot of help with walking and/or transfers;A lot of help with bathing/dressing/bathroom;Assistance with cooking/housework;Assist for transportation;Help with stairs or ramp for entrance   Can travel by private vehicle        Equipment Recommendations  None recommended by PT    Recommendations for Other Services OT consult     Precautions / Restrictions Precautions Precautions: Fall;Posterior Hip Precaution Booklet Issued: Yes (comment) Recall of Precautions/Restrictions: Impaired Precaution/Restrictions Comments: Pt recalls only 1/3 THP - reviewed all THP Restrictions Weight Bearing Restrictions Per Provider Order: Yes LLE Weight Bearing Per Provider Order: Partial weight bearing LLE Partial Weight Bearing Percentage or Pounds: 50     Mobility  Bed Mobility Overal bed mobility: Needs Assistance Bed Mobility: Sit to Supine     Supine to sit: Mod assist, +2 for physical assistance, +2 for safety/equipment, Used rails Sit to supine: Min assist, +2 for physical assistance, +2 for safety/equipment   General bed mobility comments: cues for sequence, use of UEs to self assist and adherence to THP; physical assist to manage LEs onto bed    Transfers Overall transfer level: Needs assistance Equipment used: Rolling walker (2 wheels) Transfers: Sit to/from Stand Sit to Stand: Min assist, +2 physical assistance, +2 safety/equipment, From elevated surface           General transfer comment: Cues for  LE management, use of UEs to self assist, and adherence to THP.    Ambulation/Gait Ambulation/Gait assistance: Min assist, +2 safety/equipment Gait Distance (Feet): 17 Feet Assistive device: Rolling walker (2 wheels) Gait Pattern/deviations: Step-to pattern Gait velocity: decr     General Gait Details: cues for sequence, posture, position from RW; distance ltd by pain/fatigue   Stairs             Wheelchair Mobility     Tilt Bed    Modified Rankin (Stroke Patients Only)       Balance Overall balance assessment: History of Falls, Needs assistance Sitting-balance support: No upper extremity supported, Feet supported Sitting balance-Leahy Scale: Fair     Standing balance support: Bilateral upper extremity supported Standing balance-Leahy Scale: Poor                              Communication Communication Communication: No apparent difficulties  Cognition Arousal: Alert Behavior During Therapy: Anxious, Flat affect   PT - Cognitive impairments: Problem solving, Safety/Judgement                         Following commands: Impaired Following commands impaired: Only follows one step commands consistently    Cueing Cueing Techniques: Verbal cues, Gestural cues  Exercises Total Joint Exercises Ankle Circles/Pumps: AROM, Both, 15 reps, Supine    General Comments        Pertinent Vitals/Pain Pain Assessment Pain Assessment: 0-10 Faces Pain Scale: Hurts whole lot Pain Location: L hip with movement Pain Descriptors / Indicators: Aching, Grimacing, Guarding, Sore Pain Intervention(s): Limited  activity within patient's tolerance, Monitored during session, Premedicated before session    Home Living                          Prior Function            PT Goals (current goals can now be found in the care plan section) Acute Rehab PT Goals Patient Stated Goal: to go home PT Goal Formulation: With patient Time For Goal  Achievement: 05/10/24 Potential to Achieve Goals: Good Progress towards PT goals: Progressing toward goals    Frequency    7X/week      PT Plan      Co-evaluation PT/OT/SLP Co-Evaluation/Treatment: Yes Reason for Co-Treatment: For patient/therapist safety;To address functional/ADL transfers PT goals addressed during session: Mobility/safety with mobility OT goals addressed during session: ADL's and self-care      AM-PAC PT 6 Clicks Mobility   Outcome Measure  Help needed turning from your back to your side while in a flat bed without using bedrails?: A Lot Help needed moving from lying on your back to sitting on the side of a flat bed without using bedrails?: A Lot Help needed moving to and from a bed to a chair (including a wheelchair)?: A Lot Help needed standing up from a chair using your arms (e.g., wheelchair or bedside chair)?: A Lot Help needed to walk in hospital room?: A Little Help needed climbing 3-5 steps with a railing? : Total 6 Click Score: 12    End of Session Equipment Utilized During Treatment: Gait belt Activity Tolerance: Patient tolerated treatment well;Patient limited by fatigue Patient left: with call bell/phone within reach;in bed;with bed alarm set;with family/visitor present Nurse Communication: Mobility status PT Visit Diagnosis: Pain;Difficulty in walking, not elsewhere classified (R26.2) Pain - Right/Left: Left Pain - part of body: Hip     Time: 8594-8578 PT Time Calculation (min) (ACUTE ONLY): 16 min  Charges:    $Gait Training: 8-22 mins PT General Charges $$ ACUTE PT VISIT: 1 Visit                     [facility] PT Acute Rehabilitation Services Office [phone]    Megan Singleton 05/07/2024, 2:22 PM

## 2024-05-07 NOTE — Evaluation (Signed)
 Occupational Therapy Evaluation Patient Details Name: Megan Singleton MRN: [id number] DOB: 1950-11-21 Today's Date: 05/07/2024   History of Present Illness   Pt s/p L THR rev 2* proximal femur periprosthetic fx.  Pt wtih hx of Sz, fibromyalgia, RLS, ETOH abuse and L THR 04/07/24     Clinical Impressions Pt was walking with a RW and able to complete basic ADLs modified independently prior to admission. She was dependent in all IADLs. Pt presents with pain, poor baseline memory, decreased standing balance and impaired activity tolerance. She requires +2 mod assist to come to EOB and min assist +2 safety for ambulation with RW. Pt needs extensive cues to adhere to hip precautions and WB status. She requires set up to total assist for ADLs. OT to focus on educating pt's family in how to assist her with ADLs and ADL transfers when pt returns home. Pt unlikely to be able to generalize precautions or recall compensatory strategies. Recommending HHOT.      If plan is discharge home, recommend the following:   A lot of help with walking and/or transfers;A lot of help with bathing/dressing/bathroom;Assistance with cooking/housework;Direct supervision/assist for medications management;Direct supervision/assist for financial management;Assist for transportation;Help with stairs or ramp for entrance     Functional Status Assessment   Patient has had a recent decline in their functional status and demonstrates the ability to make significant improvements in function in a reasonable and predictable amount of time.     Equipment Recommendations   None recommended by OT     Recommendations for Other Services         Precautions/Restrictions   Precautions Precautions: Fall;Posterior Hip Precaution Booklet Issued: Yes (comment) Recall of Precautions/Restrictions: Impaired Precaution/Restrictions Comments: Pt recalls only 1/3 THP - reviewed all THP Restrictions Weight Bearing  Restrictions Per Provider Order: Yes LLE Weight Bearing Per Provider Order: Partial weight bearing LLE Partial Weight Bearing Percentage or Pounds: 50     Mobility Bed Mobility Overal bed mobility: Needs Assistance Bed Mobility: Supine to Sit     Supine to sit: Mod assist, +2 for physical assistance, +2 for safety/equipment, Used rails     General bed mobility comments: verbal and tactile cues to sequence, assist for L LE and trunk    Transfers Overall transfer level: Needs assistance Equipment used: Rolling walker (2 wheels) Transfers: Sit to/from Stand Sit to Stand: Min assist, +2 physical assistance, +2 safety/equipment, From elevated surface           General transfer comment: Cues for LE management, use of UEs to self assist, and adherence to THP.      Balance Overall balance assessment: History of Falls, Needs assistance Sitting-balance support: No upper extremity supported, Feet supported Sitting balance-Leahy Scale: Fair     Standing balance support: Bilateral upper extremity supported Standing balance-Leahy Scale: Poor                             ADL either performed or assessed with clinical judgement   ADL Overall ADL's : Needs assistance/impaired Eating/Feeding: Set up;Sitting   Grooming: Brushing hair;Sitting;Set up (shampoo cap)   Upper Body Bathing: Minimal assistance;Sitting   Lower Body Bathing: Total assistance;Sit to/from stand   Upper Body Dressing : Minimal assistance;Sitting   Lower Body Dressing: Total assistance;Sit to/from stand   Toilet Transfer: Minimal assistance;Ambulation;Rolling walker (2 wheels)   Toileting- Clothing Manipulation and Hygiene: Maximal assistance;Sit to/from stand       Functional mobility  during ADLs: Minimal assistance;+2 for safety/equipment;Rolling walker (2 wheels)       Vision Ability to See in Adequate Light: 0 Adequate Patient Visual Report: No change from baseline       Perception          Praxis         Pertinent Vitals/Pain Pain Assessment Pain Assessment: Faces Faces Pain Scale: Hurts whole lot Pain Location: L hip with movement Pain Descriptors / Indicators: Aching, Grimacing, Guarding, Sore Pain Intervention(s): Monitored during session, Premedicated before session, Repositioned     Extremity/Trunk Assessment Upper Extremity Assessment Upper Extremity Assessment: Overall WFL for tasks assessed   Lower Extremity Assessment Lower Extremity Assessment: Defer to PT evaluation   Cervical / Trunk Assessment Cervical / Trunk Assessment: Other exceptions (weakness)   Communication Communication Communication: No apparent difficulties   Cognition Arousal: Alert Behavior During Therapy: Anxious, Flat affect Cognition: Cognition impaired     Awareness: Intellectual awareness impaired, Online awareness impaired Memory impairment (select all impairments): Short-term memory, Working Civil [employer] fast streamer, Engineer, structural memory Attention impairment (select first level of impairment): Sustained attention Executive functioning impairment (select all impairments): Initiation, Sequencing, Organization, Reasoning, Problem solving                   Following commands: Impaired Following commands impaired: Only follows one step commands consistently     Cueing  General Comments   Cueing Techniques: Verbal cues;Gestural cues      Exercises     Shoulder Instructions      Home Living Family/patient expects to be discharged to:: Private residence Living Arrangements: Spouse/significant other;Children Available Help at Discharge: Family;Available 24 hours/day Type of Home: House Home Access: Stairs to enter [employer] of Steps: 1 + 1 Entrance Stairs-Rails: None Home Layout: Two level;Able to live on main level with bedroom/bathroom     Bathroom Shower/Tub: Producer, television/film/video: Standard     Home Equipment: Agricultural consultant  (2 wheels);BSC/3in1          Prior Functioning/Environment Prior Level of Function : Needs assist             Mobility Comments: walked with RW ADLs Comments: assist for IADLs due to pt's poor memory    OT Problem List: Decreased strength;Decreased activity tolerance;Impaired balance (sitting and/or standing);Decreased safety awareness;Decreased knowledge of use of DME or AE;Decreased knowledge of precautions;Pain   OT Treatment/Interventions: Self-care/ADL training;DME and/or AE instruction;Therapeutic activities;Patient/family education;Balance training      OT Goals(Current goals can be found in the care plan section)   Acute Rehab OT Goals OT Goal Formulation: With patient Time For Goal Achievement: 05/21/24 Potential to Achieve Goals: Fair ADL Goals Pt Will Perform Grooming: with contact guard assist;standing Pt Will Transfer to Toilet: with contact guard assist;ambulating;bedside commode Pt Will Perform Toileting - Clothing Manipulation and hygiene: with min assist;sit to/from stand Additional ADL Goal #1: Pt's caregivers will be knowledgeable in level of assist pt requires for LB bathing and dressing adhering to posterior hip and WB precautions. Additional ADL Goal #2: Pt will complete bed mobility with min assist in preparation for ADLs.   OT Frequency:  Min 2X/week    Co-evaluation PT/OT/SLP Co-Evaluation/Treatment: Yes Reason for Co-Treatment: For patient/therapist safety;To address functional/ADL transfers PT goals addressed during session: Mobility/safety with mobility OT goals addressed during session: ADL's and self-care      AM-PAC OT 6 Clicks Daily Activity     Outcome Measure Help from another person eating meals?: None Help from another  person taking care of personal grooming?: A Little Help from another person toileting, which includes using toliet, bedpan, or urinal?: Total Help from another person bathing (including washing, rinsing, drying)?:  A Lot Help from another person to put on and taking off regular upper body clothing?: A Little Help from another person to put on and taking off regular lower body clothing?: Total 6 Click Score: 14   End of Session Equipment Utilized During Treatment: Rolling walker (2 wheels);Gait belt  Activity Tolerance: Patient tolerated treatment well Patient left: in chair;with call bell/phone within reach;with chair alarm set  OT Visit Diagnosis: Unsteadiness on feet (R26.81);Other abnormalities of gait and mobility (R26.89);Pain;Muscle weakness (generalized) (M62.81);Other symptoms and signs involving the nervous system (R29.898)                Time: 8948-8874 OT Time Calculation (min): 34 min Charges:  OT General Charges $OT Visit: 1 Visit OT Evaluation $OT Eval Moderate Complexity: 1 Mod  Mliss HERO, OTR/L Acute Rehabilitation Services Office: [phone]  Kennth Mliss Helling 05/07/2024, 2:31 PM

## 2024-05-07 NOTE — Progress Notes (Signed)
 Physical Therapy Treatment Patient Details Name: Megan Singleton MRN: [id number] DOB: July 31, 1950 Today's Date: 05/07/2024   History of Present Illness Pt s/p L THR rev 2* proximal femur periprosthetic fx.  Pt wtih hx of Sz, fibromyalgia, RLS, ETOH abuse and L THR 04/07/24    PT Comments  Pt continues cooperative but with limited carry-over from prior sessions.  This date, pt progressed to ambulate into hallway with RW and step-be-step cues.      If plan is discharge home, recommend the following: A lot of help with walking and/or transfers;A lot of help with bathing/dressing/bathroom;Assistance with cooking/housework;Assist for transportation;Help with stairs or ramp for entrance   Can travel by private vehicle        Equipment Recommendations  None recommended by PT    Recommendations for Other Services OT consult     Precautions / Restrictions Precautions Precautions: Fall;Posterior Hip Precaution Booklet Issued: Yes (comment) Recall of Precautions/Restrictions: Impaired Precaution/Restrictions Comments: Pt recalls only 1/3 THP - reviewed all THP Restrictions Weight Bearing Restrictions Per Provider Order: Yes LLE Weight Bearing Per Provider Order: Partial weight bearing LLE Partial Weight Bearing Percentage or Pounds: 50     Mobility  Bed Mobility Overal bed mobility: Needs Assistance Bed Mobility: Supine to Sit     Supine to sit: Mod assist, +2 for physical assistance, +2 for safety/equipment, Used rails     General bed mobility comments: Increased time, cues for sequence and use of R LE to self asisst; physical assist to manage L LE, to control trunk and to complete rotation with use of bed pad    Transfers Overall transfer level: Needs assistance Equipment used: Rolling walker (2 wheels) Transfers: Sit to/from Stand Sit to Stand: Min assist, +2 physical assistance, +2 safety/equipment, From elevated surface           General transfer comment: Cues for  LE management, use of UEs to self assist, and adherence to THP.    Ambulation/Gait Ambulation/Gait assistance: Min assist, +2 physical assistance, +2 safety/equipment Gait Distance (Feet): 18 Feet Assistive device: Rolling walker (2 wheels) Gait Pattern/deviations: Step-to pattern Gait velocity: decr     General Gait Details: cues for sequence, posture, position from RW; distance ltd by pain/fatigue   Stairs             Wheelchair Mobility     Tilt Bed    Modified Rankin (Stroke Patients Only)       Balance Overall balance assessment: History of Falls, Needs assistance Sitting-balance support: No upper extremity supported, Feet supported Sitting balance-Leahy Scale: Fair     Standing balance support: Bilateral upper extremity supported Standing balance-Leahy Scale: Poor                              Communication Communication Communication: No apparent difficulties  Cognition Arousal: Alert Behavior During Therapy: Anxious, Flat affect   PT - Cognitive impairments: Problem solving, Safety/Judgement                         Following commands: Impaired Following commands impaired: Only follows one step commands consistently    Cueing Cueing Techniques: Verbal cues, Gestural cues  Exercises Total Joint Exercises Ankle Circles/Pumps: AROM, Both, 15 reps, Supine    General Comments        Pertinent Vitals/Pain Pain Assessment Pain Assessment: Faces Faces Pain Scale: Hurts even more Pain Location: L hip with movement Pain Descriptors /  Indicators: Aching, Grimacing, Guarding, Sore Pain Intervention(s): Limited activity within patient's tolerance, Monitored during session, Premedicated before session, Patient requesting pain meds-RN notified, RN gave pain meds during session (Pt declines ice packs - states pain increases)    Home Living                          Prior Function            PT Goals (current goals can  now be found in the care plan section) Acute Rehab PT Goals Patient Stated Goal: to go home PT Goal Formulation: With patient Time For Goal Achievement: 05/10/24 Potential to Achieve Goals: Good Progress towards PT goals: Progressing toward goals    Frequency    7X/week      PT Plan      Co-evaluation PT/OT/SLP Co-Evaluation/Treatment: Yes Reason for Co-Treatment: For patient/therapist safety;To address functional/ADL transfers PT goals addressed during session: Mobility/safety with mobility OT goals addressed during session: ADL's and self-care      AM-PAC PT 6 Clicks Mobility   Outcome Measure  Help needed turning from your back to your side while in a flat bed without using bedrails?: A Lot Help needed moving from lying on your back to sitting on the side of a flat bed without using bedrails?: A Lot Help needed moving to and from a bed to a chair (including a wheelchair)?: A Lot Help needed standing up from a chair using your arms (e.g., wheelchair or bedside chair)?: A Lot Help needed to walk in hospital room?: A Lot Help needed climbing 3-5 steps with a railing? : Total 6 Click Score: 11    End of Session Equipment Utilized During Treatment: Gait belt Activity Tolerance: Patient tolerated treatment well;Patient limited by fatigue Patient left: in chair;with call bell/phone within reach;with chair alarm set Nurse Communication: Mobility status PT Visit Diagnosis: Pain;Difficulty in walking, not elsewhere classified (R26.2) Pain - Right/Left: Left Pain - part of body: Hip     Time: 8942-8872 PT Time Calculation (min) (ACUTE ONLY): 30 min  Charges:    $Gait Training: 8-22 mins PT General Charges $$ ACUTE PT VISIT: 1 Visit                     [facility] PT Acute Rehabilitation Services Office [phone]    Meshia Rau 05/07/2024, 1:02 PM

## 2024-05-07 NOTE — Progress Notes (Signed)
 Subjective: Patient reports pain as moderate.  Poor memory. Seems agitated/annoyed today. Feels the staff has been tossing her around and moving her roughly causing increased pain. Upset that she isn't going home today and says no one told me any plans that I had to stay tonight. Threatening to leave AMA. Not sure how true those statements are considering her waxing mental status/confusion last few days. Did seem mostly with it while talking with me though.  Tolerating diet.  Urinating.   No CP, SOB.  Making some slow progress mobilizing OOB with PT/OT. Declined to work with them yesterday d/t nausea. No nausea today. Doesn't remember getting OOB with therapy today even though notes indicate she has.  Objective:   VITALS:   Vitals:   05/06/24 1347 05/06/24 2146 05/07/24 0454 05/07/24 1400  BP: 139/72 (!) 147/84 (!) 154/72 (!) 151/72  Pulse: 91 92 88 88  Resp: 16 16 12 16   Temp: 98.2 F (36.8 C) 98.5 F (36.9 C) 99.4 F (37.4 C) 98.8 F (37.1 C)  TempSrc: Oral Oral Oral Oral  SpO2: 97% 94% 96% 94%  Weight:      Height:          Latest Ref Rng & Units 05/06/2024    4:01 AM 05/05/2024    3:43 AM 05/04/2024   11:45 PM  CBC  WBC 4.0 - 10.5 K/uL 7.9  9.3  9.8   Hemoglobin 12.0 - 15.0 g/dL 8.8  9.0  9.8   Hematocrit 36.0 - 46.0 % 27.5  27.9  30.8   Platelets 150 - 400 K/uL 225  237  247       Latest Ref Rng & Units 05/06/2024    4:01 AM 05/05/2024    3:43 AM 05/04/2024   11:45 PM  BMP  Glucose 70 - 99 mg/dL 892  97  97   BUN 8 - 23 mg/dL 10  11  10    Creatinine 0.44 - 1.00 mg/dL 9.55  9.47  9.25   Sodium 135 - 145 mmol/L 141  140  141   Potassium 3.5 - 5.1 mmol/L 3.5  3.6  3.5   Chloride 98 - 111 mmol/L 105  106  104   CO2 22 - 32 mmol/L 25  25  27    Calcium  8.9 - 10.3 mg/dL 9.0  8.8  9.3    Intake/Output      10/18 0701 10/19 0700 10/19 0701 10/20 0700   P.O. 480    IV Piggyback 149.4    Total Intake(mL/kg) 629.4 (9.3)    Urine (mL/kg/hr) 950 (0.6)     Total Output 950    Net -320.6            Physical Exam: General: NAD.  Laying in bed, somewhat fidgety but calm  Resp: No increased wob Cardio: regular rate and rhythm ABD soft Neurologically intact MSK Neurovascularly intact Sensation intact distally. Some neuropathy at baseline. Intact pulses distally Dorsiflexion/Plantar flexion intact Incision: dressing C/D/I Wound vac on and functioning with 0cc of fluid in canister   Assessment: 4 Days Post-Op  S/P Procedure(s) (LRB): TOTAL HIP REVISION (Left) by Dr. Edna on 05/03/24  Principal Problem:   Periprosthetic fracture around internal prosthetic left hip joint, initial encounter [facility] Active Problems:   Acute encephalopathy   Plan: Continue delirium precautions and redirect her about why she needs to stay Up with therapy Incentive Spirometry Elevate and Apply ice to leg  Weightbearing: PWB 50% LLE, posterior hip precautions x  6 weeks Insicional and dressing care: Reinforce dressings as needed Orthopedic device(s): Wound Cjr:ozqu lateral thigh over incision Showering: Keep dressing dry VTE prophylaxis: Aspirin  81mg  BID , SCDs, ambulation Pain control: PRN, limit narcotics as able Follow - up plan: 2 weeks Contact information for today:  Evalene Chancy MD, Gerard Large PA-C  Dispo: TBD based on continued PT/OT sessions. May need SNF vs HH. PT/OT notes don't state if she has done well enough to be safely cleared to d/c.      Gerard CHRISTELLA Large, PA-C Office [phone] 05/07/2024, 2:43 PM

## 2024-05-08 DIAGNOSIS — M9702XA Periprosthetic fracture around internal prosthetic left hip joint, initial encounter: Secondary | ICD-10-CM | POA: Diagnosis not present

## 2024-05-08 MED ORDER — CEFADROXIL 500 MG PO CAPS: 500.0000 mg | ORAL_CAPSULE | Freq: Two times a day (BID) | ORAL | 0 refills | Status: AC

## 2024-05-08 MED ORDER — ADULT MULTIVITAMIN W/MINERALS CH: 1.0000 | ORAL_TABLET | Freq: Every day | ORAL | Status: AC

## 2024-05-08 MED ORDER — ONDANSETRON HCL 4 MG PO TABS: 4.0000 mg | ORAL_TABLET | Freq: Four times a day (QID) | ORAL | 0 refills | Status: AC | PRN

## 2024-05-08 MED ORDER — VITAMIN B-1 100 MG PO TABS: 100.0000 mg | ORAL_TABLET | Freq: Every day | ORAL | 0 refills | Status: AC

## 2024-05-08 MED ORDER — ROSUVASTATIN CALCIUM 10 MG PO TABS: 10.0000 mg | ORAL_TABLET | Freq: Every day | ORAL | 0 refills | Status: AC

## 2024-05-08 MED ORDER — OXYCODONE HCL 5 MG PO TABS: 5.0000 mg | ORAL_TABLET | ORAL | 0 refills | Status: AC | PRN

## 2024-05-08 MED ORDER — ASPIRIN 81 MG PO TBEC: 81.0000 mg | DELAYED_RELEASE_TABLET | Freq: Two times a day (BID) | ORAL | Status: AC

## 2024-05-08 MED ORDER — POLYETHYLENE GLYCOL 3350 17 G PO PACK: 17.0000 g | PACK | Freq: Every day | ORAL | 0 refills | Status: AC | PRN

## 2024-05-08 MED ORDER — METHOCARBAMOL 500 MG PO TABS: 500.0000 mg | ORAL_TABLET | Freq: Three times a day (TID) | ORAL | 0 refills | Status: AC | PRN

## 2024-05-08 MED ORDER — ACETAMINOPHEN 500 MG PO TABS: 1000.0000 mg | ORAL_TABLET | Freq: Three times a day (TID) | ORAL | Status: AC | PRN

## 2024-05-08 MED ORDER — FOLIC ACID 1 MG PO TABS: 1.0000 mg | ORAL_TABLET | Freq: Every day | ORAL | 0 refills | Status: AC

## 2024-05-08 NOTE — Progress Notes (Signed)
 Subjective: Patient is in good spirits this morning.  Reports that she is feeling better.  Did 2 sessions of physical therapy and 1 session of occupational therapy yesterday reports that these went well.  She mobilized 17 feet and 18 feet  in the sessions.  She is hopeful to discharge home today.  Objective:   VITALS:   Vitals:   05/06/24 2146 05/07/24 0454 05/07/24 1400 05/08/24 0440  BP: (!) 147/84 (!) 154/72 (!) 151/72 (!) 152/74  Pulse: 92 88 88 73  Resp: 16 12 16 20   Temp: 98.5 F (36.9 C) 99.4 F (37.4 C) 98.8 F (37.1 C) (!) 97.4 F (36.3 C)  TempSrc: Oral Oral Oral Oral  SpO2: 94% 96% 94% 95%  Weight:      Height:          Latest Ref Rng & Units 05/06/2024    4:01 AM 05/05/2024    3:43 AM 05/04/2024   11:45 PM  CBC  WBC 4.0 - 10.5 K/uL 7.9  9.3  9.8   Hemoglobin 12.0 - 15.0 g/dL 8.8  9.0  9.8   Hematocrit 36.0 - 46.0 % 27.5  27.9  30.8   Platelets 150 - 400 K/uL 225  237  247       Latest Ref Rng & Units 05/06/2024    4:01 AM 05/05/2024    3:43 AM 05/04/2024   11:45 PM  BMP  Glucose 70 - 99 mg/dL 892  97  97   BUN 8 - 23 mg/dL 10  11  10    Creatinine 0.44 - 1.00 mg/dL 9.55  9.47  9.25   Sodium 135 - 145 mmol/L 141  140  141   Potassium 3.5 - 5.1 mmol/L 3.5  3.6  3.5   Chloride 98 - 111 mmol/L 105  106  104   CO2 22 - 32 mmol/L 25  25  27    Calcium  8.9 - 10.3 mg/dL 9.0  8.8  9.3    Intake/Output      10/19 0701 10/20 0700 10/20 0701 10/21 0700   P.O. 240    IV Piggyback     Total Intake(mL/kg) 240 (3.5)    Urine (mL/kg/hr) 750 (0.5)    Total Output 750    Net -510            Physical Exam: General: NAD.  Laying in bed, somewhat fidgety but calm  Resp: No increased wob Cardio: regular rate and rhythm ABD soft Neurologically intact MSK Neurovascularly intact Sensation intact distally. Some neuropathy at baseline. Intact pulses distally Dorsiflexion/Plantar flexion intact Incision: dressing C/D/I Wound vac on and functioning with 0cc  of fluid in canister   Assessment: 5 Days Post-Op  S/P Procedure(s) (LRB): TOTAL HIP REVISION (Left) by Dr. Edna on 05/03/24  Principal Problem:   Periprosthetic fracture around internal prosthetic left hip joint, initial encounter [facility]) Active Problems:   Acute encephalopathy   Plan:  Up with therapy Incentive Spirometry Elevate and Apply ice to leg  S/p L THA revision femoral component for fracture 05/03/24   Post op recs: WB: 50% partial weightbearing left lower extremity and posterior precautions x 6 weeks Abx: ancef Ancef in house and discharged on cefadroxil for extended antibiotic prophylaxis. Imaging: PACU pelvis Xray Dressing: Prevena wound VAC to keep intact for 1 week DVT prophylaxis: Aspirin  81BID starting POD1 Follow up: 2 weeks after surgery for a wound check with Dr. Edna at [facility].  Address: [address]  [address], [place], [place] [address]  Office Phone: [phone]       TORIBIO DELENA HIGASHI 05/04/2024, 7:49 AM     TORIBIO HIGASHI, MD   Contact information:   [phone] 7am-5pm epic message Dr. HIGASHI, or call office for patient follow up: [phone] After hours and holidays please check [email or web address] for group call information for [facility]     TORIBIO DELENA HIGASHI, MD Office [phone] 05/08/2024, 7:44 AM

## 2024-05-08 NOTE — Progress Notes (Signed)
 Physical Therapy Treatment Patient Details Name: Megan Singleton MRN: [id number] DOB: 1950/10/02 Today's Date: 05/08/2024   History of Present Illness Pt s/p L THR rev 2* proximal femur periprosthetic fx.  Pt wtih hx of Sz, fibromyalgia, RLS, ETOH abuse and L THR 04/07/24    PT Comments  Pt reports feeling fuzzy, needing redirection to task at hand as pt easily distracted. Pt needing mod A with bed mobility and transfers with RW, increased time, step-by-step sequencing cues, constant posterior hip precaution cues with all mobility, and encouragement to mobilize with relaxation cues. Pt needing significantly increased time with all mobility. Unable to cue pt to ambulate with +1 assist, pt reporting high fear of falling. In recliner, pt tolerates seated exercises, able to perform with cues for muscle activation and to avoid breaking posterior hip precautions. Spoke with pt's spouse via phone, voicing concerns regarding pt's assistance level since revision 10/15. Plan to progress mobility with spouse present in PM session.    If plan is discharge home, recommend the following: A lot of help with walking and/or transfers;A lot of help with bathing/dressing/bathroom;Assistance with cooking/housework;Assist for transportation;Help with stairs or ramp for entrance   Can travel by private vehicle        Equipment Recommendations  None recommended by PT    Recommendations for Other Services OT consult     Precautions / Restrictions Precautions Precautions: Fall;Posterior Hip Precaution Booklet Issued: Yes (comment) Recall of Precautions/Restrictions: Impaired Precaution/Restrictions Comments: Pt recalls only 1/3 THP - reviewed all THP Restrictions Weight Bearing Restrictions Per Provider Order: Yes LLE Weight Bearing Per Provider Order: Partial weight bearing LLE Partial Weight Bearing Percentage or Pounds: 50     Mobility  Bed Mobility Overal bed mobility: Needs Assistance Bed  Mobility: Supine to Sit     Supine to sit: Mod assist, HOB elevated, Used rails     General bed mobility comments: mod A to power up to sitting at EOB, constant multimodal cues for mobility and hand placement, cues to reduce fear of falling, increased time with relaxation breaks    Transfers Overall transfer level: Needs assistance Equipment used: Rolling walker (2 wheels) Transfers: Sit to/from Stand Sit to Stand: Mod assist, From elevated surface   Step pivot transfers: Mod assist       General transfer comment: mod A to power up to standing, cues for widening BOS and activating BLE to power up, slow to rise, therapist offering relaxation cues as pt verbalizing fear of falling; mod A to step pivot to recliner, cues for 50% WB and pt appears to respect with increased BUE weightbearing on RW, cues for posterior hip precautions throughout transfers to ensure pt doesn't break 90 deg angle    Ambulation/Gait               General Gait Details: unable to cue pt to ambulate due to fear of falling   Stairs             Wheelchair Mobility     Tilt Bed    Modified Rankin (Stroke Patients Only)       Balance Overall balance assessment: History of Falls, Needs assistance Sitting-balance support: No upper extremity supported, Feet supported Sitting balance-Leahy Scale: Fair     Standing balance support: Bilateral upper extremity supported, During functional activity Standing balance-Leahy Scale: Poor  Communication Communication Communication: No apparent difficulties  Cognition Arousal: Alert Behavior During Therapy: Anxious, Flat affect   PT - Cognitive impairments: Problem solving, Safety/Judgement, Sequencing                       PT - Cognition Comments: pt states I can't cross my legs for posterior hip precautions, closes eyes often needing cues to maintain open, verbalizes fear of falling often during  session requiring constant multimodal cues for mobility, needing cues for sequencing and all mobility Following commands: Impaired Following commands impaired: Follows one step commands inconsistently    Cueing Cueing Techniques: Verbal cues, Gestural cues  Exercises Total Joint Exercises Ankle Circles/Pumps: AROM, Both, 10 reps, Supine Quad Sets: AROM, Both, 10 reps, Supine Long Arc Quad: AROM, Left, 10 reps, Seated Marching in Standing: AROM, Left, 10 reps, Seated (sitting, not to 90deg)    General Comments        Pertinent Vitals/Pain Pain Assessment Pain Assessment: 0-10 Pain Score:  (5/10 at beginning of session, 3/10 at end of session) Pain Location: L hip Pain Descriptors / Indicators: Aching, Grimacing, Guarding, Sore Pain Intervention(s): Limited activity within patient's tolerance, Monitored during session, Premedicated before session, Repositioned    Home Living                          Prior Function            PT Goals (current goals can now be found in the care plan section) Acute Rehab PT Goals Patient Stated Goal: to go home PT Goal Formulation: With patient Time For Goal Achievement: 05/10/24 Potential to Achieve Goals: Good Progress towards PT goals: Progressing toward goals    Frequency    7X/week      PT Plan      Co-evaluation              AM-PAC PT 6 Clicks Mobility   Outcome Measure  Help needed turning from your back to your side while in a flat bed without using bedrails?: A Lot Help needed moving from lying on your back to sitting on the side of a flat bed without using bedrails?: A Lot Help needed moving to and from a bed to a chair (including a wheelchair)?: A Lot Help needed standing up from a chair using your arms (e.g., wheelchair or bedside chair)?: A Lot Help needed to walk in hospital room?: A Little Help needed climbing 3-5 steps with a railing? : Total 6 Click Score: 12    End of Session Equipment  Utilized During Treatment: Gait belt Activity Tolerance: Patient tolerated treatment well;Other (comment) (fear of falling) Patient left: in chair;with call bell/phone within reach;with chair alarm set;with family/visitor present Nurse Communication: Mobility status PT Visit Diagnosis: Pain;Difficulty in walking, not elsewhere classified (R26.2) Pain - Right/Left: Left Pain - part of body: Hip     Time: 8893-8798 PT Time Calculation (min) (ACUTE ONLY): 55 min  Charges:    $Therapeutic Exercise: 8-22 mins $Therapeutic Activity: 38-52 mins PT General Charges $$ ACUTE PT VISIT: 1 Visit                     Tori Yanette Tripoli PT, DPT 05/08/24, 1:26 PM

## 2024-05-08 NOTE — Plan of Care (Signed)
   Problem: Education: Goal: Knowledge of General Education information will improve Description Including pain rating scale, medication(s)/side effects and non-pharmacologic comfort measures Outcome: Progressing   Problem: Health Behavior/Discharge Planning: Goal: Ability to manage health-related needs will improve Outcome: Progressing

## 2024-05-08 NOTE — TOC Transition Note (Signed)
 Transition of Care [facility]) - Discharge Note  Patient Details  Name: Megan Singleton MRN: [id number] Date of Birth: 04-06-1951  Transition of Care [facility]) CM/SW Contact:  Duwaine GORMAN Aran, LCSW Phone Number: 05/08/2024, 12:45 PM  Clinical Narrative: Patient will discharge home today and will resume HH services through [facility]. HHPT/OT orders placed by hospitalist. [facility] notified in hub. Care management signing off.  Final next level of care: Home w Home Health Services Barriers to Discharge: Barriers Resolved  Patient Goals and CMS Choice Patient states their goals for this hospitalization and ongoing recovery are:: return home CMS Medicare.gov Compare Post Acute Care list provided to:: Patient Choice offered to / list presented to : Patient  Discharge Plan and Services Additional resources added to the After Visit Summary for   In-house Referral: Clinical Social Work Post Acute Care Choice: Home Health          DME Arranged: N/A DME Agency: NA HH Arranged: PT, OT HH Agency: [facility] Date HH Agency Contacted: 05/08/24  Social Drivers of Health (SDOH) Interventions SDOH Screenings   Food Insecurity: No Food Insecurity (05/02/2024)  Housing: Low Risk  (05/02/2024)  Transportation Needs: No Transportation Needs (05/02/2024)  Utilities: Not At Risk (05/02/2024)  Depression (PHQ2-9): Medium Risk (03/29/2021)  Financial Resource Strain: Low Risk  (04/15/2021)   Received from [facility]  Physical Activity: Inactive (04/15/2021)   Received from [facility]  Social Connections: Socially Isolated (05/02/2024)  Stress: Stress Concern Present (04/15/2021)   Received from [facility]  Tobacco Use: Medium Risk (05/03/2024)   Readmission Risk Interventions    05/04/2024   11:47 AM  Readmission Risk Prevention Plan  Post Dischage Appt Complete  Medication Screening Complete  Transportation Screening Complete

## 2024-05-08 NOTE — Progress Notes (Signed)
 PT Cancellation Note  Patient Details Name: Megan Singleton MRN: [id number] DOB: [birth year]   Cancelled Treatment:    Reason Eval/Treat Not Completed: Other (comment) (awaiting IV team). RN in room reports messaged IV team as pt's IV has gone bad and awaiting someone to come and replace. Pt requesting therapy check back at later time as she is in a lot of pain and waiting for IV to be replaced.   Tori Anquan Azzarello PT, DPT 05/08/24, 9:03 AM

## 2024-05-08 NOTE — Discharge Summary (Addendum)
 Physician Discharge Summary  Megan Singleton FMW:[id number] DOB: 01-Jun-1951 DOA: 05/02/2024  PCP: Jolee Madelin Patch, MD  Admit date: 05/02/2024 Discharge date: 05/08/2024  Admitted From: Home Disposition: Home  Recommendations for Outpatient Follow-up:  Follow up with PCP in 1 week with repeat CBC/BMP Outpatient follow-up with orthopedics.  Discharge wound care/activity/DVT prophylaxis/pain management/antibiotics as per orthopedics recommendations Follow up in ED if symptoms worsen or new appear   Home Health: PT/OT Equipment/Devices: None  Discharge Condition: Stable CODE STATUS: Full Diet recommendation: Heart healthy  Brief/Interim Summary: 73 y.o. year old female with past medical history of hypothyroidism, hyperlipidemia restless leg syndrome, seizure disorder, bipolar disorder, osteoarthritis with recent left hip arthroplasty who developed periprosthetic fracture and was admitted by the orthopedic surgery team for repair on 05/02/24 for repeat surgical intervention. [facility] was consulted by orthopedic surgery on 05/04/2024 for altered mental status. Patient continued to have altered mental status: Code stroke was called and patient was evaluated by teleneurology. CT head negative for acute intracranial abnormality and CTA head and neck was negative for large vessel occlusion or other emergent findings. MRI brain and EEG recommended by teleneurology. Care was transferred to [facility] as primary team.  Subsequently, MRI and EEG were unremarkable.  Mental status has much improved.  Tolerating PT.  Orthopedics has cleared the patient for discharge.  She will be discharged home today with home health PT/OT  Discharge Diagnoses:  Acute encephalopathy: Unclear if this is acute metabolic or toxic encephalopathy versus if she had a seizure History of seizures -Patient had acute alteration of mental status on 05/04/2024 which continued to persist. -Code stroke was called and patient was  evaluated by teleneurology.  CT head negative for acute intracranial abnormality and CTA head and neck was negative for large vessel occlusion or other emergent findings.  MRI brain showed no acute intracranial abnormality.  EEG did not show any seizures.  Care was transferred to [facility] as primary team. -Ammonia normal.  TSH only slightly elevated.  B12 and folic acid levels normal. - Mental status has much improved.   - Continue oral Keppra. - Tolerating PT.  Orthopedics has cleared the patient for discharge.  She will be discharged home today with home health PT/OT   Periprosthetic fracture -Discharge wound care/activity/DVT prophylaxis/pain management/antibiotics as per orthopedics recommendations.  Outpatient follow-up with orthopedics   Leukocytosis - Resolved   Anemia of chronic disease -from chronic illnesses.  Hemoglobin stable.  Monitor intermittently as an outpatient   Bipolar disorder MDD/GAD - Continue sertraline and buspirone.   Hypothyroidism-continue levothyroxine    Restless leg syndrome - Continue pramipexole    Discharge Instructions  Discharge Instructions     Diet - low sodium heart healthy   Complete by: As directed    Discharge wound care:   Complete by: As directed    As per ortho recs   Increase activity slowly   Complete by: As directed       Allergies as of 05/08/2024       Reactions   Morphine Rash   Haloperidol  And Related Other (See Comments)   Akathisia/motor restlessness   Milk-related Compounds Other (See Comments)   Just gas        Medication List     STOP taking these medications    aspirin  81 MG chewable tablet Replaced by: aspirin  EC 81 MG tablet   benzonatate  100 MG capsule Commonly known as: TESSALON    diltiazem  120 MG 24 hr capsule Commonly known as: Cardizem  CD   gabapentin 600  MG tablet Commonly known as: NEURONTIN   hydrOXYzine  25 MG tablet Commonly known as: ATARAX        TAKE these medications     acetaminophen  500 MG tablet Commonly known as: TYLENOL  Take 2 tablets (1,000 mg total) by mouth every 8 (eight) hours as needed.   Advil PM 200-38 MG Tabs Generic drug: Ibuprofen-diphenhydrAMINE  Cit Take 2 tablets by mouth at bedtime.   aspirin  EC 81 MG tablet Take 1 tablet (81 mg total) by mouth 2 (two) times daily for 28 days. Swallow whole. Start taking on: May 09, 2024 Replaces: aspirin  81 MG chewable tablet   busPIRone 10 MG tablet Commonly known as: BUSPAR Take 10 mg by mouth in the morning and at bedtime.   cefadroxil 500 MG capsule Commonly known as: DURICEF Take 1 capsule (500 mg total) by mouth 2 (two) times daily for 7 days.   cyanocobalamin 1000 MCG tablet Commonly known as: VITAMIN B12 Take 1,000 mcg by mouth in the morning.   folic acid 1 MG tablet Commonly known as: FOLVITE Take 1 tablet (1 mg total) by mouth daily. Start taking on: May 09, 2024   levETIRAcetam 750 MG tablet Commonly known as: KEPPRA Take 750 mg by mouth in the morning and at bedtime.   levothyroxine  125 MCG tablet Commonly known as: SYNTHROID  Take 125 mcg by mouth daily before breakfast.   methocarbamol 500 MG tablet Commonly known as: ROBAXIN Take 500 mg by mouth every 6 (six) hours as needed for muscle spasms. What changed: Another medication with the same name was added. Make sure you understand how and when to take each.   methocarbamol 500 MG tablet Commonly known as: ROBAXIN Take 1 tablet (500 mg total) by mouth every 8 (eight) hours as needed for up to 10 days for muscle spasms. What changed: You were already taking a medication with the same name, and this prescription was added. Make sure you understand how and when to take each.   multivitamin with minerals Tabs tablet Take 1 tablet by mouth daily. Start taking on: May 09, 2024   Myrbetriq 50 MG Tb24 tablet Generic drug: mirabegron ER Take 50 mg by mouth in the morning.   ondansetron  4 MG  tablet Commonly known as: ZOFRAN  Take 1 tablet (4 mg total) by mouth every 6 (six) hours as needed for nausea.   oxyCODONE  5 MG immediate release tablet Commonly known as: Roxicodone  Take 1 tablet (5 mg total) by mouth every 4 (four) hours as needed for up to 7 days for severe pain (pain score 7-10) or moderate pain (pain score 4-6). What changed:  when to take this reasons to take this additional instructions   polyethylene glycol 17 g packet Commonly known as: MIRALAX / GLYCOLAX Take 17 g by mouth daily as needed for mild constipation.   pramipexole  1.5 MG tablet Commonly known as: MIRAPEX  Take 1.5-3 mg by mouth See admin instructions. Take 1.5 mg by mouth in the morning and 3 mg in the evening   rosuvastatin  10 MG tablet Commonly known as: CRESTOR  Take 1 tablet (10 mg total) by mouth daily.   sertraline 100 MG tablet Commonly known as: ZOLOFT Take 100 mg by mouth in the morning.   thiamine 100 MG tablet Commonly known as: Vitamin B-1 Take 1 tablet (100 mg total) by mouth daily. Start taking on: May 09, 2024               Discharge Care Instructions  (From admission, onward)  Start     Ordered   05/08/24 0000  Discharge wound care:       Comments: As per ortho recs   05/08/24 1201              Follow-up Information     [facility]  Follow up.   Why: [facility] will provide PT and OT in the home after discharge. Contact information: [address] [place] [address] [phone]         Jolee Madelin Patch, MD. Schedule an appointment as soon as possible for a visit.   Specialty: Family Medicine Contact information: [address] [facility] [place] [address] [phone]                Allergies  Allergen Reactions   Morphine Rash   Haloperidol  And Related Other (See Comments)    Akathisia/motor restlessness   Milk-Related Compounds Other (See Comments)     Just gas    Consultations: Orthopedics.  Teleneurology   Procedures/Studies: ECHOCARDIOGRAM COMPLETE Result Date: 05/05/2024    ECHOCARDIOGRAM REPORT   Patient Name:   Tyrina J Brannen Date of Exam: 05/05/2024 Medical Rec #:  [id number]           Height:       64.0 in Accession #:    [id number]          Weight:       149.3 lb Date of Birth:  06/05/1951           BSA:          1.728 m Patient Age:    72 years            BP:           125/55 mmHg Patient Gender: F                   HR:           88 bpm. Exam Location:  Inpatient Procedure: 2D Echo, Color Doppler and Cardiac Doppler (Both Spectral and Color            Flow Doppler were utilized during procedure). Indications:    Stroke I63.9  History:        Patient has prior history of Echocardiogram examinations, most                 recent 06/24/2022. Risk Factors:Hypertension and Dyslipidemia.  Sonographer:    Tinnie Gosling RDCS Referring Phys: [id number] SOPHIE MAO IMPRESSIONS  1. Left ventricular ejection fraction, by estimation, is 65 to 70%. The left ventricle has normal function. The left ventricle has no regional wall motion abnormalities. Left ventricular diastolic parameters were normal.  2. Right ventricular systolic function is normal. The right ventricular size is normal. Tricuspid regurgitation signal is inadequate for assessing PA pressure.  3. The mitral valve is normal in structure. Trivial mitral valve regurgitation. No evidence of mitral stenosis.  4. The aortic valve was not well visualized. Unable to determine aortic valve morphology due to image quality. Aortic valve regurgitation is not visualized. No aortic stenosis is present.  5. The inferior vena cava is normal in size with greater than 50% respiratory variability, suggesting right atrial pressure of 3 mmHg.  6. Cannot exclude a small PFO. Comparison(s): A prior study was performed on 06/24/2022. No significant change from prior study. FINDINGS  Left Ventricle: Left  ventricular ejection fraction, by estimation, is 65 to 70%. The left  ventricle has normal function. The left ventricle has no regional wall motion abnormalities. The left ventricular internal cavity size was normal in size. There is  no left ventricular hypertrophy. Left ventricular diastolic parameters were normal. Right Ventricle: The right ventricular size is normal. No increase in right ventricular wall thickness. Right ventricular systolic function is normal. Tricuspid regurgitation signal is inadequate for assessing PA pressure. Left Atrium: Left atrial size was normal in size. Right Atrium: Right atrial size was normal in size. Pericardium: There is no evidence of pericardial effusion. Mitral Valve: The mitral valve is normal in structure. Mild to moderate mitral annular calcification. Trivial mitral valve regurgitation. No evidence of mitral valve stenosis. Tricuspid Valve: The tricuspid valve is normal in structure. Tricuspid valve regurgitation is not demonstrated. No evidence of tricuspid stenosis. Aortic Valve: The aortic valve was not well visualized. There is mild aortic valve annular calcification. Aortic valve regurgitation is not visualized. No aortic stenosis is present. Pulmonic Valve: The pulmonic valve was normal in structure. Pulmonic valve regurgitation is not visualized. No evidence of pulmonic stenosis. Aorta: The aortic root and ascending aorta are structurally normal, with no evidence of dilitation. Venous: The inferior vena cava is normal in size with greater than 50% respiratory variability, suggesting right atrial pressure of 3 mmHg. IAS/Shunts: Cannot exclude a small PFO.  LEFT VENTRICLE PLAX 2D LVIDd:         4.80 cm   Diastology LVIDs:         3.20 cm   LV e' medial:    8.05 cm/s LV PW:         0.80 cm   LV E/e' medial:  7.6 LV IVS:        0.80 cm   LV e' lateral:   7.29 cm/s LVOT diam:     2.00 cm   LV E/e' lateral: 8.3 LV SV:         71 LV SV Index:   41 LVOT Area:     3.14 cm LV  IVRT:       129 msec  RIGHT VENTRICLE             IVC RV S prime:     15.60 cm/s  IVC diam: 1.50 cm TAPSE (M-mode): 1.9 cm LEFT ATRIUM             Index        RIGHT ATRIUM           Index LA diam:        2.90 cm 1.68 cm/m   RA Area:     13.00 cm LA Vol (A2C):   33.6 ml 19.45 ml/m  RA Volume:   29.50 ml  17.07 ml/m LA Vol (A4C):   33.1 ml 19.16 ml/m LA Biplane Vol: 34.2 ml 19.79 ml/m  AORTIC VALVE LVOT Vmax:   117.00 cm/s LVOT Vmean:  84.700 cm/s LVOT VTI:    0.226 m  AORTA Ao Root diam: 3.20 cm Ao Asc diam:  3.00 cm MITRAL VALVE MV Area (PHT): 2.93 cm    SHUNTS MV E velocity: 60.80 cm/s  Systemic VTI:  0.23 m MV A velocity: 73.70 cm/s  Systemic Diam: 2.00 cm MV E/A ratio:  0.82 Emeline Calender Electronically signed by Emeline Calender Signature Date/Time: 05/05/2024/4:53:02 PM    Final    MR BRAIN WO CONTRAST Result Date: 05/05/2024 EXAM: MRI BRAIN WITHOUT CONTRAST 05/05/2024 11:01:03 AM TECHNIQUE: Multiplanar multisequence MRI of the head/brain was performed without the administration of  intravenous contrast. COMPARISON: CT of the head dated 05/04/2024. CLINICAL HISTORY: Mental status change, unknown cause. FINDINGS: BRAIN AND VENTRICLES: No acute infarct. No intracranial hemorrhage. No mass. No midline shift. No hydrocephalus. There is mild periventricular and subcortical white matter disease. There is patchy increased T2 signal within the pons. The sella is unremarkable. Normal flow voids. ORBITS: The patient is status post bilateral lens replacement. SINUSES AND MASTOIDS: No acute abnormality. BONES AND SOFT TISSUES: Normal marrow signal. No acute soft tissue abnormality. IMPRESSION: 1. No acute intracranial abnormality. 2. Mild periventricular and subcortical white matter disease. 3. Patchy increased T2 signal within the pons. Electronically signed by: Evalene Coho MD 05/05/2024 11:09 AM EDT RP Workstation: [id number]   EEG adult Result Date: 05/05/2024 Shelton Arlin KIDD, MD     05/06/2024  6:52 AM  Patient Name: NIJA KOOPMAN MRN: [id number] Epilepsy Attending: Arlin KIDD Shelton Referring Physician/Provider: Andrez Chroman, NP Date: 05/05/2024 Duration: 32.24 mins Patient history: 73yo F with ams. EEG to evaluate for seizure Level of alertness: Awake AEDs during EEG study: LEV Technical aspects: This EEG study was done with scalp electrodes positioned according to the 10-20 International system of electrode placement. Electrical activity was reviewed with band pass filter of 1-70Hz , sensitivity of 7 uV/mm, display speed of 38mm/sec with a 60Hz  notched filter applied as appropriate. EEG data were recorded continuously and digitally stored.  Video monitoring was available and reviewed as appropriate. Description: The posterior dominant rhythm consists of 8-9 Hz activity of moderate voltage (25-35 uV) seen predominantly in posterior head regions, symmetric and reactive to eye opening and eye closing. Hyperventilation and photic stimulation were not performed.   IMPRESSION: This study is within normal limits. No seizures or epileptiform discharges were seen throughout the recording. A normal interictal EEG does not exclude the diagnosis of epilepsy. Priyanka KIDD Shelton   CT ANGIO HEAD NECK W WO CM (CODE STROKE) Result Date: 05/05/2024 EXAM: CTA HEAD AND NECK WITH AND WITHOUT 05/05/2024 12:34:10 AM TECHNIQUE: CTA of the head and neck was performed with and without the administration of 75 mL of iohexol  (OMNIPAQUE ) 350 MG/ML injection. Multiplanar 2D and/or 3D reformatted images are provided for review. Automated exposure control, iterative reconstruction, and/or weight based adjustment of the mA/kV was utilized to reduce the radiation dose to as low as reasonably achievable. Stenosis of the internal carotid arteries measured using NASCET criteria. COMPARISON: Comparison with a CT from earlier the same day. CLINICAL HISTORY: Neuro deficit, acute, stroke suspected. Code stroke evaluation, CT head without code  stroke already done, acute encephalopathy, no medications in several hours, patient very lethargic and hard to arouse. FINDINGS: LIMITATIONS/ARTIFACTS: Examination degraded by motion artifact. Evaluation of the intracranial circulation limited by motion. CTA NECK: AORTIC ARCH AND ARCH VESSELS: Mild atherosclerotic change about the aortic arch. No dissection or arterial injury. No significant stenosis of the brachiocephalic or subclavian arteries. CERVICAL CAROTID ARTERIES: Both internal carotid arteries patent to the turmini without stenosis. No dissection or arterial injury. No hemodynamically significant stenosis by NASCET criteria. CERVICAL VERTEBRAL ARTERIES: No dissection, arterial injury, or significant stenosis. Both V4 segments patent without visible stenosis. LUNGS AND MEDIASTINUM: Mild subsegmental atelectatic changes noted within the visualized lungs. SOFT TISSUES: No acute abnormality. BONES: No acute abnormality. CTA HEAD: ANTERIOR CIRCULATION: Both internal carotid arteries patent to the turmini without stenosis. A1 segments, anterior carotid complex, and anterior cerebral arteries widely patent. No M1 stenosis or occlusion. Distal anterior carotid arteries perfused and symmetric. No aneurysm. POSTERIOR CIRCULATION: Both PICA patent.  Basilar mildly diminutive but grossly patent without visible stenosis. Superior cerebral arteries patent bilaterally. Fetal type origin of the PCAs bilaterally. Both PCAs patent without stenosis. No significant stenosis of the vertebral arteries. No aneurysm. OTHER: No dural venous sinus thrombosis on this non-dedicated study. IMPRESSION: 1. Motion degraded exam. 2. Negative CTA for large vessel occlusion or other emergent findings. No hemodynamically significant or correctable stenosis. 3. Aortic atherosclerosis. Electronically signed by: Morene Hoard MD 05/05/2024 01:02 AM EDT RP Workstation: [id number]   CT HEAD CODE STROKE WO CONTRAST Result Date:  05/04/2024 EXAM: CT HEAD WITHOUT CONTRAST 05/04/2024 11:49:00 PM TECHNIQUE: CT of the head was performed without the administration of intravenous contrast. Automated exposure control, iterative reconstruction, and/or weight based adjustment of the mA/kV was utilized to reduce the radiation dose to as low as reasonably achievable. COMPARISON: Comparison made with prior study from 03/15/2021. CLINICAL HISTORY: Neuro deficit, acute, stroke suspected. Lethergic, hard to wake up, no medications in 5 hours. FINDINGS: BRAIN AND VENTRICLES: No acute hemorrhage. No evidence of acute infarct. Generalized age related atrophy with mild chronic microvascular ischemic disease. No hydrocephalus. No extra-axial collection. No mass effect or midline shift. ORBITS: No acute abnormality. SINUSES: No acute abnormality. SOFT TISSUES AND SKULL: No acute soft tissue abnormality. No skull fracture. Aspects is 10. IMPRESSION: 1. No acute intracranial abnormality. 2. Aspects is 10. 3. Findings communicated by telephone to lavanda horns at 11:56 pm on 05/04/2024. Electronically signed by: Morene Hoard MD 05/04/2024 11:58 PM EDT RP Workstation: [id number]   DG CHEST PORT 1 VIEW Result Date: 05/04/2024 CLINICAL DATA:  Difficulty breathing, shortness of breath EXAM: PORTABLE CHEST 1 VIEW COMPARISON:  01/01/2024 FINDINGS: Heart and mediastinal contours within normal limits. Left lung clear. Airspace opacity in the right lower lobe. No effusions. No acute bony abnormality. IMPRESSION: Right lower lobe airspace opacity could reflect atelectasis or pneumonia. Electronically Signed   By: Franky Crease M.D.   On: 05/04/2024 23:47   DG HIP UNILAT W OR W/O PELVIS 2-3 VIEWS LEFT Result Date: 05/03/2024 CLINICAL DATA:  Postop.  Periprosthetic fracture. EXAM: DG HIP (WITH OR WITHOUT PELVIS) 2-3V*L* COMPARISON:  Preoperative imaging FINDINGS: Revision of left hip arthroplasty in expected alignment. Cerclage wire fixation of periprosthetic  fracture, fracture line not well demonstrated by radiograph. Recent postsurgical change includes air and edema in the soft tissues. IMPRESSION: Revision of left hip arthroplasty in expected alignment. Cerclage wire fixation of periprosthetic fracture, known fracture is not well demonstrated by radiograph. Electronically Signed   By: Andrea Gasman M.D.   On: 05/03/2024 18:23   DG HIP UNILAT WITH PELVIS 1V LEFT Result Date: 05/03/2024 CLINICAL DATA:  Elective surgery. EXAM: DG HIP (WITH OR WITHOUT PELVIS) 1V*L* COMPARISON:  Radiograph and CT 04/25/2024 FINDINGS: Seven fluoroscopic spot views of the hip submitted from the operating room. Imaging obtained during revision exchange of femoral component of hip arthroplasty with a cerclage wire about the proximal femur. Fluoroscopy time 6 seconds. Dose 0.57 mGy. IMPRESSION: Intraoperative fluoroscopy during revision of left hip arthroplasty. Electronically Signed   By: Andrea Gasman M.D.   On: 05/03/2024 18:22   DG C-Arm 1-60 Min-No Report Result Date: 05/03/2024 Fluoroscopy was utilized by the requesting physician.  No radiographic interpretation.   DG C-Arm 1-60 Min-No Report Result Date: 05/03/2024 Fluoroscopy was utilized by the requesting physician.  No radiographic interpretation.   CT Hip Left Wo Contrast Result Date: 04/25/2024 CLINICAL DATA:  Hip replacement, periprosthetic fracture suspected. History of fall several weeks ago. EXAM: CT OF  THE LEFT HIP WITHOUT CONTRAST TECHNIQUE: Multidetector CT imaging of the left hip was performed according to the standard protocol. Multiplanar CT image reconstructions were also generated. RADIATION DOSE REDUCTION: This exam was performed according to the departmental dose-optimization program which includes automated exposure control, adjustment of the mA and/or kV according to patient size and/or use of iterative reconstruction technique. COMPARISON:  Left hip radiographs dated 04/25/2024. MRI of the left  hip dated 03/10/2024. FINDINGS: Bones/Joint/Cartilage Status post left total hip arthroplasty with associated streak artifact, which limits detailed evaluation of the surrounding bone and soft tissues. Longitudinal linear lucency extending approximately 4.5 cm along the anterior medial cortex of the left proximal femoral diaphysis, adjacent to the proximal stem of the femoral prosthesis (series 5, images 56-69 and series 7, images 64-66), concerning for nondisplaced periprosthetic fracture. Left hip arthroplasty is anatomically aligned with intact hardware. Visualized left sacroiliac joint and pubic symphysis are anatomically aligned with mild degenerative arthropathy. Ligaments Ligaments are suboptimally evaluated by CT. Muscles and Tendons No appreciable acute abnormality. Soft tissue Postsurgical changes and soft tissue swelling along the left lateral hip. No loculated fluid collection. Other: Sigmoid colonic diverticulosis. No enlarged lymph nodes identified in the field of view. IMPRESSION: Status post left total hip arthroplasty with longitudinal linear lucency extending along the anterior medial cortex of the left proximal femoral diaphysis adjacent to the proximal stem of the femoral prosthesis, concerning for nondisplaced periprosthetic fracture. Electronically Signed   By: Harrietta Sherry M.D.   On: 04/25/2024 11:20   DG Hip Unilat W or Wo Pelvis 1 View Left Result Date: 04/25/2024 CLINICAL DATA:  Left hip pain after fall several weeks ago. EXAM: DG HIP (WITH OR WITHOUT PELVIS) 1V*L* COMPARISON:  None Available. FINDINGS: Status post left total hip arthroplasty. No fracture or dislocation is noted. IMPRESSION: No acute abnormality seen. Electronically Signed   By: Lynwood Landy Raddle M.D.   On: 04/25/2024 09:47   DG Pelvis 1-2 Views Result Date: 04/25/2024 CLINICAL DATA:  Left hip pain after fall. EXAM: PELVIS - 1-2 VIEW COMPARISON:  None Available. FINDINGS: There is no evidence of pelvic fracture or  diastasis. No pelvic bone lesions are seen. Status post left hip arthroplasty. IMPRESSION: No acute abnormality seen. Electronically Signed   By: Lynwood Landy Raddle M.D.   On: 04/25/2024 09:46      Subjective: Patient seen and examined at bedside.  Has intermittent left hip pain but is hoping to go home today after working with physical therapy.  No fever, vomiting, worsening abdominal pain reported.  Discharge Exam: Vitals:   05/07/24 1400 05/08/24 0440  BP: (!) 151/72 (!) 152/74  Pulse: 88 73  Resp: 16 20  Temp: 98.8 F (37.1 C) (!) 97.4 F (36.3 C)  SpO2: 94% 95%    General: Pt is alert, awake, not in acute distress.  Slow to respond.  Poor historian.  Flat affect.  On room air. Cardiovascular: rate controlled, S1/S2 + Respiratory: bilateral decreased breath sounds at bases Abdominal: Soft, NT, ND, bowel sounds + Extremities: Trace lower extremity edema, no cyanosis    The results of significant diagnostics from this hospitalization (including imaging, microbiology, ancillary and laboratory) are listed below for reference.     Microbiology: Recent Results (from the past 240 hours)  Surgical pcr screen     Status: None   Collection Time: 05/02/24  4:49 PM   Specimen: Nasal Mucosa; Nasal Swab  Result Value Ref Range Status   MRSA, PCR NEGATIVE NEGATIVE Final  Staphylococcus aureus NEGATIVE NEGATIVE Final    Comment: (NOTE) The Xpert SA Assay (FDA approved for NASAL specimens in patients 57 years of age and older), is one component of a comprehensive surveillance program. It is not intended to diagnose infection nor to guide or monitor treatment. Performed at [facility], [address] [place], [place] [address]      Labs: BNP (last 3 results) No results for input(s): BNP in the last 8760 hours. Basic Metabolic Panel: Recent Labs  Lab 05/03/24 0720 05/04/24 1849 05/04/24 2345 05/05/24 0343 05/06/24 0401  NA 143 141 141 140 141  K 4.0  4.1 3.5 3.6 3.5  CL 107 105 104 106 105  CO2 24 27 27 25 25   GLUCOSE 94 107* 97 97 107*  BUN 16 11 10 11 10   CREATININE 0.50 0.69 0.74 0.52 0.44  CALCIUM  9.1 9.1 9.3 8.8* 9.0  MG  --   --   --   --  2.4   Liver Function Tests: Recent Labs  Lab 05/04/24 2313 05/04/24 2345 05/06/24 0401  AST 28 28 20   ALT 11 10 6   ALKPHOS 140* 130* 111  BILITOT 0.3 0.3 0.3  PROT 6.1* 6.0* 5.5*  ALBUMIN 4.2 4.1 3.5   No results for input(s): LIPASE, AMYLASE in the last 168 hours. Recent Labs  Lab 05/05/24 0343  AMMONIA 31   CBC: Recent Labs  Lab 05/03/24 0720 05/04/24 0811 05/04/24 2345 05/05/24 0343 05/06/24 0401  WBC 6.8 11.4* 9.8 9.3 7.9  NEUTROABS  --   --   --  6.6 5.5  HGB 12.0 10.0* 9.8* 9.0* 8.8*  HCT 37.1 32.0* 30.8* 27.9* 27.5*  MCV 95.4 98.2 100.3* 100.0 98.9  PLT 301 300 247 237 225   Cardiac Enzymes: No results for input(s): CKTOTAL, CKMB, CKMBINDEX, TROPONINI in the last 168 hours. BNP: Invalid input(s): POCBNP CBG: Recent Labs  Lab 05/04/24 2335  GLUCAP 96   D-Dimer No results for input(s): DDIMER in the last 72 hours. Hgb A1c Recent Labs    05/06/24 0401  HGBA1C 4.7*   Lipid Profile Recent Labs    05/06/24 0401  CHOL 140  HDL 47  LDLCALC 71  TRIG 109  CHOLHDL 3.0   Thyroid  function studies No results for input(s): TSH, T4TOTAL, T3FREE, THYROIDAB in the last 72 hours.  Invalid input(s): FREET3 Anemia work up Recent Labs    05/06/24 0401  VITAMINB12 807  FOLATE >20.0   Urinalysis    Component Value Date/Time   COLORURINE YELLOW 05/05/2024 2141   APPEARANCEUR CLEAR 05/05/2024 2141   LABSPEC 1.026 05/05/2024 2141   PHURINE 5.0 05/05/2024 2141   GLUCOSEU NEGATIVE 05/05/2024 2141   HGBUR NEGATIVE 05/05/2024 2141   HGBUR trace-lysed 06/11/2009 0833   BILIRUBINUR NEGATIVE 05/05/2024 2141   KETONESUR 20 (A) 05/05/2024 2141   PROTEINUR NEGATIVE 05/05/2024 2141   UROBILINOGEN 0.2 06/11/2009 0833   NITRITE  NEGATIVE 05/05/2024 2141   LEUKOCYTESUR NEGATIVE 05/05/2024 2141   Sepsis Labs Recent Labs  Lab 05/04/24 0811 05/04/24 2345 05/05/24 0343 05/06/24 0401  WBC 11.4* 9.8 9.3 7.9   Microbiology Recent Results (from the past 240 hours)  Surgical pcr screen     Status: None   Collection Time: 05/02/24  4:49 PM   Specimen: Nasal Mucosa; Nasal Swab  Result Value Ref Range Status   MRSA, PCR NEGATIVE NEGATIVE Final   Staphylococcus aureus NEGATIVE NEGATIVE Final    Comment: (NOTE) The Xpert SA Assay (FDA approved for  NASAL specimens in patients 31 years of age and older), is one component of a comprehensive surveillance program. It is not intended to diagnose infection nor to guide or monitor treatment. Performed at [facility], [address] [place], [place] [address]      Time coordinating discharge: 35 minutes  SIGNED:   Sophie Mao, MD  [facility] 05/08/2024, 12:02 PM

## 2024-05-08 NOTE — Discharge Instructions (Signed)
 INSTRUCTIONS AFTER JOINT REPLACEMENT   Remove items at home which could result in a fall. This includes throw rugs or furniture in walking pathways ICE to the affected joint every three hours while awake for 30 minutes at a time, for at least the first 3-5 days, and then as needed for pain and swelling.  Continue to use ice for pain and swelling. You may notice swelling that will progress down to the foot and ankle.  This is normal after surgery.  Elevate your leg when you are not up walking on it.   Continue to use the breathing machine you got in the hospital (incentive spirometer) which will help keep your temperature down.  It is common for your temperature to cycle up and down following surgery, especially at night when you are not up moving around and exerting yourself.  The breathing machine keeps your lungs expanded and your temperature down.   DIET:  As you were doing prior to hospitalization, we recommend a well-balanced diet.  DRESSING / WOUND CARE / SHOWERING  Keep the surgical dressing until follow up.  The dressing is water proof, so you can shower without any extra covering.  IF THE DRESSING FALLS OFF or the wound gets wet inside, change the dressing with sterile gauze.  Please use good hand washing techniques before changing the dressing.  Do not use any lotions or creams on the incision until instructed by your surgeon.    ACTIVITY  Increase activity slowly as tolerated, but follow the weight bearing instructions below.   No driving for 6 weeks or until further direction given by your physician.  You cannot drive while taking narcotics.  No lifting or carrying greater than 10 lbs. until further directed by your surgeon. Avoid periods of inactivity such as sitting longer than an hour when not asleep. This helps prevent blood clots.  You may return to work once you are authorized by your doctor.     WEIGHT BEARING   50% partial weight bearing as tolerated with assist device  (walker) as directed, use it as long as suggested by your surgeon or therapist, typically at least 6 weeks.   EXERCISES  Results after joint replacement surgery are often greatly improved when you follow the exercise, range of motion and muscle strengthening exercises prescribed by your doctor. Safety measures are also important to protect the joint from further injury. Any time any of these exercises cause you to have increased pain or swelling, decrease what you are doing until you are comfortable again and then slowly increase them. If you have problems or questions, call your caregiver or physical therapist for advice.   Rehabilitation is important following a joint replacement. After just a few days of immobilization, the muscles of the leg can become weakened and shrink (atrophy).  These exercises are designed to build up the tone and strength of the thigh and leg muscles and to improve motion. Often times heat used for twenty to thirty minutes before working out will loosen up your tissues and help with improving the range of motion but do not use heat for the first two weeks following surgery (sometimes heat can increase post-operative swelling).   These exercises can be done on a training (exercise) mat, on the floor, on a table or on a bed. Use whatever works the best and is most comfortable for you.    Use music or television while you are exercising so that the exercises are a pleasant break in your  day. This will make your life better with the exercises acting as a break in your routine that you can look forward to.   Perform all exercises about fifteen times, three times per day or as directed.  You should exercise both the operative leg and the other leg as well.  Exercises include:   Quad Sets - Tighten up the muscle on the front of the thigh (Quad) and hold for 5-10 seconds.   Straight Leg Raises - With your knee straight (if you were given a brace, keep it on), lift the leg to 60  degrees, hold for 3 seconds, and slowly lower the leg.  Perform this exercise against resistance later as your leg gets stronger.  Leg Slides: Lying on your back, slowly slide your foot toward your buttocks, bending your knee up off the floor (only go as far as is comfortable). Then slowly slide your foot back down until your leg is flat on the floor again.  Angel Wings: Lying on your back spread your legs to the side as far apart as you can without causing discomfort.  Hamstring Strength:  Lying on your back, push your heel against the floor with your leg straight by tightening up the muscles of your buttocks.  Repeat, but this time bend your knee to a comfortable angle, and push your heel against the floor.  You may put a pillow under the heel to make it more comfortable if necessary.   A rehabilitation program following joint replacement surgery can speed recovery and prevent re-injury in the future due to weakened muscles. Contact your doctor or a physical therapist for more information on knee rehabilitation.    CONSTIPATION  Constipation is defined medically as fewer than three stools per week and severe constipation as less than one stool per week.  Even if you have a regular bowel pattern at home, your normal regimen is likely to be disrupted due to multiple reasons following surgery.  Combination of anesthesia, postoperative narcotics, change in appetite and fluid intake all can affect your bowels.   YOU MUST use at least one of the following options; they are listed in order of increasing strength to get the job done.  They are all available over the counter, and you may need to use some, POSSIBLY even all of these options:    Drink plenty of fluids (prune juice may be helpful) and high fiber foods Colace 100 mg by mouth twice a day  Senokot for constipation as directed and as needed Dulcolax (bisacodyl), take with full glass of water  Miralax (polyethylene glycol) once or twice a day as  needed.  If you have tried all these things and are unable to have a bowel movement in the first 3-4 days after surgery call either your surgeon or your primary doctor.    If you experience loose stools or diarrhea, hold the medications until you stool forms back up.  If your symptoms do not get better within 1 week or if they get worse, check with your doctor.  If you experience the worst abdominal pain ever or develop nausea or vomiting, please contact the office immediately for further recommendations for treatment.   ITCHING:  If you experience itching with your medications, try taking only a single pain pill, or even half a pain pill at a time.  You can also use Benadryl  over the counter for itching or also to help with sleep.   TED HOSE STOCKINGS:  Use stockings on both  legs until for at least 2 weeks or as directed by physician office. They may be removed at night for sleeping.  MEDICATIONS:  See your medication summary on the "After Visit Summary" that nursing will review with you.  You may have some home medications which will be placed on hold until you complete the course of blood thinner medication.  It is important for you to complete the blood thinner medication as prescribed.   Blood clot prevention (DVT Prophylaxis): After surgery you are at an increased risk for a blood clot. you were prescribed a blood thinner, Aspirin  81mg , to be taken twice daily for a total of 4 weeks from surgery to help reduce your risk of getting a blood clot. This will help prevent a blood clot. Signs of a pulmonary embolus (blood clot in the lungs) include sudden short of breath, feeling lightheaded or dizzy, chest pain with a deep breath, rapid pulse rapid breathing. Signs of a blood clot in your arms or legs include new unexplained swelling and cramping, warm, red or darkened skin around the painful area. Please call the office or 911 right away if these signs or symptoms develop.  PRECAUTIONS:  If you  experience chest pain or shortness of breath - call 911 immediately for transfer to the hospital emergency department.   If you develop a fever greater that 101 F, purulent drainage from wound, increased redness or drainage from wound, foul odor from the wound/dressing, or calf pain - CONTACT YOUR SURGEON.                                                   FOLLOW-UP APPOINTMENTS:  If you do not already have a post-op appointment, please call the office for an appointment to be seen by your surgeon.  Guidelines for how soon to be seen are listed in your "After Visit Summary", but are typically between 2-3 weeks after surgery.  POST-OPERATIVE OPIOID TAPER INSTRUCTIONS: It is important to wean off of your opioid medication as soon as possible. If you do not need pain medication after your surgery it is ok to stop day one. Opioids include: Codeine, Hydrocodone(Norco, Vicodin), Oxycodone (Percocet, oxycontin ) and hydromorphone  amongst others.  Long term and even short term use of opiods can cause: Increased pain response Dependence Constipation Depression Respiratory depression And more.  Withdrawal symptoms can include Flu like symptoms Nausea, vomiting And more Techniques to manage these symptoms Hydrate well Eat regular healthy meals Stay active Use relaxation techniques(deep breathing, meditating, yoga) Do Not substitute Alcohol to help with tapering If you have been on opioids for less than two weeks and do not have pain than it is ok to stop all together.  Plan to wean off of opioids This plan should start within one week post op of your joint replacement. Maintain the same interval or time between taking each dose and first decrease the dose.  Cut the total daily intake of opioids by one tablet each day Next start to increase the time between doses. The last dose that should be eliminated is the evening dose.   MAKE SURE YOU:  Understand these instructions.  Get help right away  if you are not doing well or get worse.    Thank you for letting us  be a part of your medical care team.  It is a privilege we  respect greatly.  We hope these instructions will help you stay on track for a fast and full recovery!

## 2024-05-08 NOTE — Progress Notes (Signed)
 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  Pt wtih hx of Sz, fibromyalgia, RLS, ETOH abuse and L THR 04/07/24    PT Comments  Spouse present offering encouragement and cues throughout session; spouse knowledgeable regarding posterior hip precautions and 50% WB LLE, reports he will be present to cue pt at home as well. Pt with less anxiousness this session, positive response to encouragement and cues from spouse and therapist; spouse reports pt appears to be at baseline cognitively. Pt progressed amb to 40 ft with RW, min A, step to gait pattern with decreased LLE stance time and weight bearing, appears to adhere to 50% LLE WB status. Completed stair training with spouse present; spouse reports 4 steps to enter home with R handrail ascending; pt ascends 3 4 steps with R handrail and descends 2 6 steps with R handrail, min A+2 for safety with constant cues for posterior hip precautions and WB status and pt appears to adhere. Spouse and pt verbalize confidence regarding stairs and ambulation at home, decline additional practice, report ready to d/c home with spouse and son supporting pt 24/7 as needed.    If plan is discharge home, recommend the following: Assistance with cooking/housework;Assist for transportation;Help with stairs or ramp for entrance;A little help with walking and/or transfers;A little help with bathing/dressing/bathroom   Can travel by private vehicle        Equipment Recommendations  None recommended by PT    Recommendations for Other Services OT consult     Precautions / Restrictions Precautions Precautions: Fall;Posterior Hip Precaution Booklet Issued: Yes (comment) Recall of Precautions/Restrictions: Impaired Precaution/Restrictions Comments: pt able to recall 50% WB on LLE and states I can't cross my  legs for posterior hip precautions, therapist reiterating 3/3 posterior hip precautions and spouse at bedside able to state them Restrictions Weight Bearing Restrictions Per Provider Order: Yes LLE Weight Bearing Per Provider Order: Partial weight bearing LLE Partial Weight Bearing Percentage or Pounds: 50     Mobility  Bed Mobility  General bed mobility comments: in recliner upon arrival    Transfers Overall transfer level: Needs assistance Equipment used: Rolling walker (2 wheels) Transfers: Sit to/from Stand Sit to Stand: Contact guard assist          General transfer comment: CGA to power up to stand from recliner, cues for posterior hip precautions with powering up and for return to sitting    Ambulation/Gait Ambulation/Gait assistance: Min assist Gait Distance (Feet): 40 Feet Assistive device: Rolling walker (2 wheels) Gait Pattern/deviations: Step-to pattern, Decreased stance time - left, Decreased weight shift to left Gait velocity: decreased     General Gait Details: step-to gait pattern wtih decreased LLE stance time and weightbearing, appears to adhear to 50% LLE WB and pt reports I think I'm doing less than 50, no overt LOB or unsteadiness   Stairs Stairs: Yes Stairs assistance: Min assist, +2 safety/equipment Stair Management: One rail Right Number of Stairs: 2 General stair comments: spouse present reports pt has 4 steps to enter the home with R handrail when ascending; therapist provided demonstration for sequencing then pt able to ascend 3 4 steps wtih R handrail ascending and descend 2 6 steps with R handrail, min A +45for safety with constant cues for 50% LLE WB and pt appears to adhear with strong weight shift to arms on banister; spouse present  verbalizes satisfaction with training and declines additional reps   Wheelchair Mobility     Tilt Bed    Modified Rankin (Stroke Patients Only)       Balance Overall balance assessment: History of  Falls, Needs assistance Sitting-balance support: No upper extremity supported, Feet supported Sitting balance-Leahy Scale: Fair     Standing balance support: Reliant on assistive device for balance, During functional activity, Bilateral upper extremity supported Standing balance-Leahy Scale: Poor                              Communication Communication Communication: No apparent difficulties  Cognition Arousal: Alert Behavior During Therapy: Anxious, Flat affect   PT - Cognitive impairments: Problem solving, Safety/Judgement, Sequencing                       PT - Cognition Comments: pt needing cues for posterior hip precautions and LLE 50% WB status throughout session Following commands: Impaired Following commands impaired: Follows one step commands inconsistently    Cueing Cueing Techniques: Verbal cues, Gestural cues, Tactile cues, Visual cues  Exercises     General Comments        Pertinent Vitals/Pain Pain Assessment Pain Assessment: 0-10 Pain Score: 3  Pain Location: L hip Pain Descriptors / Indicators: Sore Pain Intervention(s): Limited activity within patient's tolerance, Monitored during session    Home Living                          Prior Function            PT Goals (current goals can now be found in the care plan section) Acute Rehab PT Goals Patient Stated Goal: to go home PT Goal Formulation: With patient Time For Goal Achievement: 05/10/24 Potential to Achieve Goals: Good Progress towards PT goals: Progressing toward goals    Frequency    7X/week      PT Plan      Co-evaluation              AM-PAC PT 6 Clicks Mobility   Outcome Measure  Help needed turning from your back to your side while in a flat bed without using bedrails?: A Lot Help needed moving from lying on your back to sitting on the side of a flat bed without using bedrails?: A Lot Help needed moving to and from a bed to a chair  (including a wheelchair)?: A Little Help needed standing up from a chair using your arms (e.g., wheelchair or bedside chair)?: A Little Help needed to walk in hospital room?: A Little Help needed climbing 3-5 steps with a railing? : A Little 6 Click Score: 16    End of Session Equipment Utilized During Treatment: Gait belt Activity Tolerance: Patient tolerated treatment well Patient left: in chair;with call bell/phone within reach;with chair alarm set;with family/visitor present Nurse Communication: Mobility status PT Visit Diagnosis: Pain;Difficulty in walking, not elsewhere classified (R26.2) Pain - Right/Left: Left Pain - part of body: Hip     Time: 8644-8577 PT Time Calculation (min) (ACUTE ONLY): 27 min  Charges:    $Gait Training: 8-22 mins $Therapeutic Activity: 8-22 mins PT General Charges $$ ACUTE PT VISIT: 1 Visit                     Tori Beverley Sherrard PT, DPT 05/08/24, 2:34 PM

## 2024-05-08 NOTE — Progress Notes (Signed)
 Occupational Therapy Treatment Patient Details Name: Nakea J Cousineau MRN: [id number] DOB: 02/11/1951 Today's Date: 05/08/2024   History of present illness Pt s/p L THR rev 2* proximal femur periprosthetic fx.  Pt wtih hx of Sz, fibromyalgia, RLS, ETOH abuse and L THR 04/07/24   OT comments  Pt received in recliner with son present. Son exits room shortly after OT begins session. Pt reporting need to use BSC, is able to verbalize posterior hip precautions 3/3 and state PWB at 50% on surgical LLE. Pt requires MOD A + RW to rise from recliner and perform step pivot t/f BSC. Pt with decreased STM, often asking what's next mid-transfer forgetting that she needed to void. MAX A for pericare and clothing management following continent urine void. Per PT, spouse will be returning later this afternoon for additional education. If pt is to discharge home, she will need extensive reinforcement from family on precautions and compensatory strategies. She will need hands-on assist at all times for mobility and ADL performance. OT will follow acutely. Patient will benefit from continued inpatient follow up therapy, <3 hours/day       If plan is discharge home, recommend the following:  A lot of help with walking and/or transfers;A lot of help with bathing/dressing/bathroom;Assistance with cooking/housework;Direct supervision/assist for medications management;Direct supervision/assist for financial management;Assist for transportation;Help with stairs or ramp for entrance   Equipment Recommendations  None recommended by OT       Precautions / Restrictions Precautions Precautions: Fall;Posterior Hip Precaution Booklet Issued: Yes (comment) Recall of Precautions/Restrictions: Impaired Precaution/Restrictions Comments: recalls 3/3 post hip precautions, verbalizes PWB on LLE at 50%. Difficulties adhering to during functional transfers and mobility, anticipate will need continued reinforcement due to  decreased cognition Restrictions Weight Bearing Restrictions Per Provider Order: Yes LLE Weight Bearing Per Provider Order: Partial weight bearing LLE Partial Weight Bearing Percentage or Pounds: 50       Mobility Bed Mobility Overal bed mobility: Needs Assistance             General bed mobility comments: NT pt recieved and left in recliner    Transfers Overall transfer level: Needs assistance Equipment used: Rolling walker (2 wheels) Transfers: Sit to/from Stand Sit to Stand: Mod assist           General transfer comment: mod A for powering up from recliner, max verbal cues for PWB on LLE and cues for posterior hip precautions     Balance Overall balance assessment: History of Falls, Needs assistance Sitting-balance support: No upper extremity supported, Feet supported Sitting balance-Leahy Scale: Fair     Standing balance support: Bilateral upper extremity supported, During functional activity Standing balance-Leahy Scale: Poor                             ADL either performed or assessed with clinical judgement   ADL Overall ADL's : Needs assistance/impaired                         Toilet Transfer: Adhering to hip precautions;Cueing for safety;Cueing for sequencing;Ambulation;BSC/3in1;Rolling walker (2 wheels);Moderate assistance Toilet Transfer Details (indicate cue type and reason): recliner <> BSC, MIN A for RW mgmt and cues for sequencing and adherence to PWB on LLE. pt forgetting what she is doing halfway through and requries mod vcs to redirect Toileting- Clothing Manipulation and Hygiene: Maximal assistance;Sit to/from stand Toileting - Clothing Manipulation Details (indicate cue type and reason): requires  assist for pericare and doffing/donning underwear over hips     Functional mobility during ADLs: Minimal assistance;Rolling walker (2 wheels);Moderate assistance General ADL Comments: pt will require reinforcement of WB and hip  precautions with STM deficits     Communication Communication Communication: No apparent difficulties   Cognition Arousal: Alert Behavior During Therapy: Anxious, Flat affect Cognition: Cognition impaired     Awareness: Intellectual awareness impaired, Online awareness impaired Memory impairment (select all impairments): Short-term memory, Working memory, Engineer, structural memory Attention impairment (select first level of impairment): Sustained attention Executive functioning impairment (select all impairments): Initiation, Sequencing, Organization, Reasoning, Problem solving OT - Cognition Comments: pt with decreased STM, frequently forgetting what she is doing mid-task, and asking what is next. requires step by step cues for attention and safe mobility.                 Following commands: Impaired        Cueing   Cueing Techniques: Verbal cues, Gestural cues             Pertinent Vitals/ Pain       Pain Assessment Pain Assessment: 0-10 Pain Score: 2  Pain Location: L hip Pain Descriptors / Indicators: Aching, Grimacing, Guarding, Sore Pain Intervention(s): Limited activity within patient's tolerance, Monitored during session   Frequency  Min 2X/week        Progress Toward Goals  OT Goals(current goals can now be found in the care plan section)  Progress towards OT goals: Progressing toward goals  Acute Rehab OT Goals OT Goal Formulation: With patient Time For Goal Achievement: 05/21/24 Potential to Achieve Goals: Fair ADL Goals Pt Will Perform Grooming: with contact guard assist;standing Pt Will Transfer to Toilet: with contact guard assist;ambulating;bedside commode Pt Will Perform Toileting - Clothing Manipulation and hygiene: with min assist;sit to/from stand Additional ADL Goal #1: Pt's caregivers will be knowledgeable in level of assist pt requires for LB bathing and dressing adhering to posterior hip and WB precautions. Additional ADL Goal  #2: Pt will complete bed mobility with min assist in preparation for ADLs.  Plan         AM-PAC OT 6 Clicks Daily Activity     Outcome Measure   Help from another person eating meals?: None Help from another person taking care of personal grooming?: A Little Help from another person toileting, which includes using toliet, bedpan, or urinal?: Total Help from another person bathing (including washing, rinsing, drying)?: Total Help from another person to put on and taking off regular upper body clothing?: Total Help from another person to put on and taking off regular lower body clothing?: Total 6 Click Score: 11    End of Session Equipment Utilized During Treatment: Rolling walker (2 wheels);Gait belt  OT Visit Diagnosis: Unsteadiness on feet (R26.81);Other abnormalities of gait and mobility (R26.89);Pain;Muscle weakness (generalized) (M62.81);Other symptoms and signs involving the nervous system (R29.898) Pain - Right/Left: Left Pain - part of body: Hip   Activity Tolerance Patient tolerated treatment well   Patient Left in chair;with call bell/phone within reach;with chair alarm set   Nurse Communication Mobility status        Time: 1255-1315 OT Time Calculation (min): 20 min  Charges: OT General Charges $OT Visit: 1 Visit OT Treatments $Self Care/Home Management : 8-22 mins  Nathasha Fiorillo L. Brittania Sudbeck, OTR/L  05/08/24, 2:23 PM

## 2024-07-27 ENCOUNTER — Other Ambulatory Visit: Payer: Self-pay

## 2024-07-27 ENCOUNTER — Ambulatory Visit: Attending: Orthopedic Surgery | Admitting: Physical Therapy

## 2024-07-27 ENCOUNTER — Encounter: Payer: Self-pay | Admitting: Physical Therapy

## 2024-07-27 DIAGNOSIS — M25552 Pain in left hip: Secondary | ICD-10-CM | POA: Insufficient documentation

## 2024-07-27 DIAGNOSIS — M6281 Muscle weakness (generalized): Secondary | ICD-10-CM | POA: Diagnosis present

## 2024-07-27 DIAGNOSIS — M25562 Pain in left knee: Secondary | ICD-10-CM | POA: Diagnosis present

## 2024-07-27 DIAGNOSIS — R2681 Unsteadiness on feet: Secondary | ICD-10-CM | POA: Diagnosis present

## 2024-07-27 DIAGNOSIS — R262 Difficulty in walking, not elsewhere classified: Secondary | ICD-10-CM | POA: Diagnosis present

## 2024-07-27 NOTE — Therapy (Signed)
 " OUTPATIENT PHYSICAL THERAPY LOWER EXTREMITY EVALUATION   Patient Name: Megan Singleton MRN: [id number] DOB:February 24, 1951, 74 y.o., female Today's Date: 07/27/2024  END OF SESSION:  PT End of Session - 07/27/24 1315     Visit Number 1    Number of Visits 17    Date for Recertification  09/21/24    Authorization Type [employer] MCR    Authorization Time Period TBD    Progress Note Due on Visit 10    PT Start Time 1232    PT Stop Time 1310    PT Time Calculation (min) 38 min    Activity Tolerance Patient tolerated treatment well    Behavior During Therapy WFL for tasks assessed/performed          Past Medical History:  Diagnosis Date   Alcohol  abuse    Allergy    Anxiety    Depression    Fibromyalgia    GERD (gastroesophageal reflux disease)    Hashimoto's thyroiditis    Hyperlipidemia    IBS (irritable bowel syndrome)    Osteopenia    Prolonged QT interval    RLS (restless legs syndrome)    Seizure disorder (HCC)    she is on life long medication per Neuro and can not have driving privileges if she  stops meds   Seizures (HCC)    Thyroid  disease    Past Surgical History:  Procedure Laterality Date   BUNIONECTOMY     Right and Hammertoe   CESAREAN SECTION  8021,8009   x's 2   ESOPHAGEAL MANOMETRY N/A 12/09/2016   Procedure: ESOPHAGEAL MANOMETRY (EM);  Surgeon: Celestia Agent, MD;  Location: [facility];  Service: Endoscopy;  Laterality: N/A;   ESOPHAGOGASTRODUODENOSCOPY (EGD) WITH PROPOFOL  N/A 05/23/2020   Procedure: ESOPHAGOGASTRODUODENOSCOPY (EGD) WITH PROPOFOL  with Botox ;  Surgeon: Elicia Claw, MD;  Location: [facility];  Service: Gastroenterology;  Laterality: N/A;   FOOT SURGERY     Left--Neuroma, Bunion   LIPOSUCTION     abdominal   SUBMUCOSAL INJECTION  05/23/2020   Procedure: SUBMUCOSAL INJECTION;  Surgeon: Elicia Claw, MD;  Location: [facility];  Service: Gastroenterology;;   TONSILLECTOMY     as a child   TOTAL HIP REVISION Left 05/03/2024    Procedure: TOTAL HIP REVISION;  Surgeon: Edna Toribio LABOR, MD;  Location: [facility];  Service: Orthopedics;  Laterality: Left;  FEMORAL COMPONENT STRYKER NOTIFIED   WRIST SURGERY     Right wrist---Benign mass   Patient Active Problem List   Diagnosis Date Noted   Periprosthetic fracture around internal prosthetic left hip joint, initial encounter (HCC) 04/25/2024   Panic attacks 11/19/2014   Pap smear for cervical cancer screening 05/21/2014   Cough 09/27/2013   Thoracic back pain 08/18/2011   Conjunctivitis 07/16/2011   General medical examination 01/06/2011   DYSPAREUNIA 02/10/2010   VAGINITIS, ATROPHIC 02/10/2010   Pilonidal cyst 02/10/2010   PNEUMONIA 01/16/2010   INCONTINENCE, MIXED, URGE/STRESS 01/16/2010   CHEST PAIN UNSPECIFIED 10/04/2009   MALAISE AND FATIGUE 09/10/2009   DIARRHEA, CHRONIC 09/10/2009   Subjective visual disturbance 06/10/2009   GANGLION CYST, WRIST, LEFT 06/10/2009   Asymptomatic postmenopausal status 06/10/2009   PLANTAR FASCIITIS 12/13/2008   RESTLESS LEGS SYNDROME 09/05/2008   Impacted cerumen 07/23/2008   DEPRESSION 07/04/2008   Infective otitis externa 07/04/2008   Acute upper respiratory infection 07/04/2008   SWEATING 08/11/2007   Hyperlipidemia 08/02/2007   Alcohol  abuse 08/02/2007   SINUSITIS- ACUTE-NOS 05/11/2007   Allergic rhinitis 05/11/2007   PAIN IN JOINT,  HAND 03/09/2007   Hypothyroidism 12/15/2006   Hemorrhoids 12/15/2006   GERD 12/15/2006   Hematuria 12/15/2006   Disorder of bone and cartilage 12/15/2006   Acute encephalopathy 12/15/2006    PCP: Jolee Madelin Patch MD   REFERRING PROVIDER: Edna Toribio LABOR, MD  REFERRING DIAG:  Diagnosis  M97.8XXD,Z96.649 (ICD-10-CM) - Peri-prosthetic fracture of femur at tip of prosthesis, subsequent encounter    THERAPY DIAG:  Pain in left hip  Acute pain of left knee  Muscle weakness (generalized)  Difficulty in walking, not elsewhere classified  Unsteadiness on  feet  Rationale for Evaluation and Treatment: Rehabilitation  ONSET DATE: most recent surgery (THA revision) 10/25  SUBJECTIVE:   SUBJECTIVE STATEMENT:  Had the THA, then they found a fracture around the implant and they had to go back in and revise it. Things have been pretty painful after the revision, L knee has been pretty painful recently too. I have a few aches and pains and old age things in general, nothing serious. Sitting is fine but getting up can be very painful.  PERTINENT HISTORY: See above  PAIN:  Are you having pain? 0/10 sitting, can get to 9/10 with standing and walking   PRECAUTIONS: Posterior hip  RED FLAGS: None   WEIGHT BEARING RESTRICTIONS: No  FALLS:  Has patient fallen in last 6 months? Yes. Number of falls I manage to fall every now and then, usually a trip on something, no FOF   LIVING ENVIRONMENT: Lives with: lives with their spouse Lives in: House/apartment Stairs: 6-7 STE B rails but cannot reach both at the same time, flight of steps inside the home  Has following equipment at home: Single point cane, Walker - 2 wheeled, and Grab bars  OCCUPATION: retiredaudiological scientist (surgery)  PLOF: Independent, Independent with basic ADLs, Independent with gait, and Independent with transfers  PATIENT GOALS: be able to work in the yard, keep balance in yard   NEXT MD VISIT: careers adviser in one year   OBJECTIVE:  Note: Objective measures were completed at Evaluation unless otherwise noted.    PATIENT SURVEYS:  LEFS  Extreme difficulty/unable (0), Quite a bit of difficulty (1), Moderate difficulty (2), Little difficulty (3), No difficulty (4) Survey date:  07/27/24 eval   Any of your usual work, housework or school activities 3  2. Usual hobbies, recreational or sporting activities 4  3. Getting into/out of the bath 4  4. Walking between rooms 4  5. Putting on socks/shoes 3  6. Squatting  0  7. Lifting an object, like a bag of groceries from the floor 1   8. Performing light activities around your home 3  9. Performing heavy activities around your home 2  10. Getting into/out of a car 1  11. Walking 2 blocks 3  12. Walking 1 mile 0  13. Going up/down 10 stairs (1 flight) 4  14. Standing for 1 hour 1  15.  sitting for 1 hour 4  16. Running on even ground 0  17. Running on uneven ground 0  18. Making sharp turns while running fast 0  19. Hopping  0  20. Rolling over in bed 1  Score total:  38/80     COGNITION: Overall cognitive status: Within functional limits for tasks assessed and self identifies some recent STM issues      SENSATION: WFL (but does report neuropathy on bottoms of feet)      LOWER EXTREMITY MMT:  MMT Right eval Left eval  Hip flexion  3 3  Hip extension    Hip abduction 2+ best (seated) 2+ best (seated)  Hip adduction    Hip internal rotation    Hip external rotation    Knee flexion 4+ 4+  Knee extension 4+ 4+  Ankle dorsiflexion    Ankle plantarflexion    Ankle inversion    Ankle eversion     (Blank rows = not tested)    FUNCTIONAL TESTS:  5 times sit to stand: 12 seconds but had to use UEs and had posterior LOB/fall back into chair x1 time  Timed up and go (TUG): 15.2 seconds SPC, 10 seconds no device but increased pain  2 minute walk test: 395ft SPC   GAIT: Distance walked: 35ft  Assistive device utilized: Single point cane Level of assistance: Modified independence Comments: L LE internally rotated, hip abductor weakness noted, flexed at hips                                                                                                                                 TREATMENT DATE:   07/27/24  Eval, POC  Education as below      PATIENT EDUCATION:  Education details: exam findings, POC Person educated: Patient Education method: Explanation Education comprehension: verbalized understanding  HOME EXERCISE PROGRAM: TBD   ASSESSMENT:  CLINICAL IMPRESSION: Patient is a  74 y.o. F who was seen today for physical therapy evaluation and treatment for  Diagnosis  M97.8XXD,Z96.649 (ICD-10-CM) - Peri-prosthetic fracture of femur at tip of prosthesis, subsequent encounter  . Objectives as above. Will make every effort to address pain, reduce fall risk, and address objective findings/desired functional goals moving forward.   OBJECTIVE IMPAIRMENTS: Abnormal gait, decreased activity tolerance, decreased balance, decreased cognition, decreased mobility, difficulty walking, decreased strength, hypomobility, and pain.   ACTIVITY LIMITATIONS: standing, squatting, stairs, transfers, and locomotion level  PARTICIPATION LIMITATIONS: meal prep, cleaning, laundry, driving, shopping, community activity, and yard work  PERSONAL FACTORS: Age, Behavior pattern, Fitness, Past/current experiences, Social background, and Time since onset of injury/illness/exacerbation are also affecting patient's functional outcome.   REHAB POTENTIAL: Good  CLINICAL DECISION MAKING: Stable/uncomplicated  EVALUATION COMPLEXITY: Low   GOALS: Goals reviewed with patient? No  SHORT TERM GOALS: Target date: 08/24/2024   Will be compliant with appropriate progressive HEP  Baseline: Goal status: INITIAL  2.  Will be able to complete 5xSTS test with no posterior LOB and pain no more than 5/10 Baseline:  Goal status: INITIAL  3.  Pain with standing/walking to have improved by at least 25% Baseline:  Goal status: INITIAL    LONG TERM GOALS: Target date: 09/21/2024    MMT to have improved by at least one grade in all weak groups  Baseline:  Goal status: INITIAL  2.  Pain with standing/walking to have improved by at least 50% Baseline:  Goal status: INITIAL  3.  Will score at least 50 on Berg to show reduced fall  risk  Baseline:  Goal status: INITIAL  4.  Will be able to perform all functional tasks related to gardening, including floor to stand, with no more than S and pain no more  than 5/10 Baseline:  Goal status: INITIAL  5.  LEFS to have improved by at least 10 points  Baseline:  Goal status: INITIAL     PLAN:  PT FREQUENCY: 2x/week  PT DURATION: 8 weeks  PLANNED INTERVENTIONS: 97750- Physical Performance Testing, 97110-Therapeutic exercises, 97530- Therapeutic activity, [id number]- Neuromuscular re-education, 97535- Self Care, [id number]- Manual therapy, [id number]- Gait training, and [phone]- Aquatic Therapy  PLAN FOR NEXT SESSION: check for structural LLD following hip surgeries, still needs HEP. Hip strengthening, general conditioning, balance training   Josette Rough, PT, DPT 07/27/2024 1:18 PM    "

## 2024-08-01 ENCOUNTER — Ambulatory Visit: Admitting: Physical Therapy

## 2024-08-01 ENCOUNTER — Encounter: Payer: Self-pay | Admitting: Physical Therapy

## 2024-08-01 DIAGNOSIS — M25562 Pain in left knee: Secondary | ICD-10-CM

## 2024-08-01 DIAGNOSIS — R262 Difficulty in walking, not elsewhere classified: Secondary | ICD-10-CM

## 2024-08-01 DIAGNOSIS — M6281 Muscle weakness (generalized): Secondary | ICD-10-CM

## 2024-08-01 DIAGNOSIS — M25552 Pain in left hip: Secondary | ICD-10-CM

## 2024-08-01 DIAGNOSIS — R2681 Unsteadiness on feet: Secondary | ICD-10-CM

## 2024-08-01 NOTE — Therapy (Signed)
 " OUTPATIENT PHYSICAL THERAPY LOWER EXTREMITY EVALUATION   Patient Name: Megan Singleton MRN: [id number] DOB:1950-09-21, 74 y.o., female Today's Date: 08/01/2024  END OF SESSION:  PT End of Session - 08/01/24 1301     Visit Number 2    Date for Recertification  09/21/24    PT Start Time 1300    PT Stop Time 1345    PT Time Calculation (min) 45 min    Activity Tolerance Patient tolerated treatment well    Behavior During Therapy WFL for tasks assessed/performed          Past Medical History:  Diagnosis Date   Alcohol  abuse    Allergy    Anxiety    Depression    Fibromyalgia    GERD (gastroesophageal reflux disease)    Hashimoto's thyroiditis    Hyperlipidemia    IBS (irritable bowel syndrome)    Osteopenia    Prolonged QT interval    RLS (restless legs syndrome)    Seizure disorder (HCC)    she is on life long medication per Neuro and can not have driving privileges if she  stops meds   Seizures (HCC)    Thyroid  disease    Past Surgical History:  Procedure Laterality Date   BUNIONECTOMY     Right and Hammertoe   CESAREAN SECTION  8021,8009   x's 2   ESOPHAGEAL MANOMETRY N/A 12/09/2016   Procedure: ESOPHAGEAL MANOMETRY (EM);  Surgeon: Celestia Agent, MD;  Location: [facility];  Service: Endoscopy;  Laterality: N/A;   ESOPHAGOGASTRODUODENOSCOPY (EGD) WITH PROPOFOL  N/A 05/23/2020   Procedure: ESOPHAGOGASTRODUODENOSCOPY (EGD) WITH PROPOFOL  with Botox ;  Surgeon: Elicia Claw, MD;  Location: [facility];  Service: Gastroenterology;  Laterality: N/A;   FOOT SURGERY     Left--Neuroma, Bunion   LIPOSUCTION     abdominal   SUBMUCOSAL INJECTION  05/23/2020   Procedure: SUBMUCOSAL INJECTION;  Surgeon: Elicia Claw, MD;  Location: [facility];  Service: Gastroenterology;;   TONSILLECTOMY     as a child   TOTAL HIP REVISION Left 05/03/2024   Procedure: TOTAL HIP REVISION;  Surgeon: Edna Toribio LABOR, MD;  Location: [facility];  Service: Orthopedics;   Laterality: Left;  FEMORAL COMPONENT STRYKER NOTIFIED   WRIST SURGERY     Right wrist---Benign mass   Patient Active Problem List   Diagnosis Date Noted   Periprosthetic fracture around internal prosthetic left hip joint, initial encounter (HCC) 04/25/2024   Panic attacks 11/19/2014   Pap smear for cervical cancer screening 05/21/2014   Cough 09/27/2013   Thoracic back pain 08/18/2011   Conjunctivitis 07/16/2011   General medical examination 01/06/2011   DYSPAREUNIA 02/10/2010   VAGINITIS, ATROPHIC 02/10/2010   Pilonidal cyst 02/10/2010   PNEUMONIA 01/16/2010   INCONTINENCE, MIXED, URGE/STRESS 01/16/2010   CHEST PAIN UNSPECIFIED 10/04/2009   MALAISE AND FATIGUE 09/10/2009   DIARRHEA, CHRONIC 09/10/2009   Subjective visual disturbance 06/10/2009   GANGLION CYST, WRIST, LEFT 06/10/2009   Asymptomatic postmenopausal status 06/10/2009   PLANTAR FASCIITIS 12/13/2008   RESTLESS LEGS SYNDROME 09/05/2008   Impacted cerumen 07/23/2008   DEPRESSION 07/04/2008   Infective otitis externa 07/04/2008   Acute upper respiratory infection 07/04/2008   SWEATING 08/11/2007   Hyperlipidemia 08/02/2007   Alcohol  abuse 08/02/2007   SINUSITIS- ACUTE-NOS 05/11/2007   Allergic rhinitis 05/11/2007   PAIN IN JOINT, HAND 03/09/2007   Hypothyroidism 12/15/2006   Hemorrhoids 12/15/2006   GERD 12/15/2006   Hematuria 12/15/2006   Disorder of bone and cartilage 12/15/2006   Acute encephalopathy  12/15/2006    PCP: Jolee Madelin Patch MD   REFERRING PROVIDER: Edna Toribio LABOR, MD  REFERRING DIAG:  Diagnosis  M97.8XXD,Z96.649 (ICD-10-CM) - Peri-prosthetic fracture of femur at tip of prosthesis, subsequent encounter    THERAPY DIAG:  Pain in left hip  Acute pain of left knee  Muscle weakness (generalized)  Difficulty in walking, not elsewhere classified  Unsteadiness on feet  Rationale for Evaluation and Treatment: Rehabilitation  ONSET DATE: most recent surgery (THA revision)  10/25  SUBJECTIVE:   SUBJECTIVE STATEMENT: Nervous because she lost her cane. Hip is go today knee is sore.  Had the THA, then they found a fracture around the implant and they had to go back in and revise it. Things have been pretty painful after the revision, L knee has been pretty painful recently too. I have a few aches and pains and old age things in general, nothing serious. Sitting is fine but getting up can be very painful.  PERTINENT HISTORY: See above  PAIN:  Are you having pain? 0/10 sitting. 7/10 as she stand from sitting   PRECAUTIONS: Posterior hip  RED FLAGS: None   WEIGHT BEARING RESTRICTIONS: No  FALLS:  Has patient fallen in last 6 months? Yes. Number of falls I manage to fall every now and then, usually a trip on something, no FOF   LIVING ENVIRONMENT: Lives with: lives with their spouse Lives in: House/apartment Stairs: 6-7 STE B rails but cannot reach both at the same time, flight of steps inside the home  Has following equipment at home: Single point cane, Walker - 2 wheeled, and Grab bars  OCCUPATION: retiredaudiological scientist (surgery)  PLOF: Independent, Independent with basic ADLs, Independent with gait, and Independent with transfers  PATIENT GOALS: be able to work in the yard, keep balance in yard   NEXT MD VISIT: careers adviser in one year   OBJECTIVE:  Note: Objective measures were completed at Evaluation unless otherwise noted.    PATIENT SURVEYS:  LEFS  Extreme difficulty/unable (0), Quite a bit of difficulty (1), Moderate difficulty (2), Little difficulty (3), No difficulty (4) Survey date:  07/27/24 eval   Any of your usual work, housework or school activities 3  2. Usual hobbies, recreational or sporting activities 4  3. Getting into/out of the bath 4  4. Walking between rooms 4  5. Putting on socks/shoes 3  6. Squatting  0  7. Lifting an object, like a bag of groceries from the floor 1  8. Performing light activities around your home 3  9.  Performing heavy activities around your home 2  10. Getting into/out of a car 1  11. Walking 2 blocks 3  12. Walking 1 mile 0  13. Going up/down 10 stairs (1 flight) 4  14. Standing for 1 hour 1  15.  sitting for 1 hour 4  16. Running on even ground 0  17. Running on uneven ground 0  18. Making sharp turns while running fast 0  19. Hopping  0  20. Rolling over in bed 1  Score total:  38/80     COGNITION: Overall cognitive status: Within functional limits for tasks assessed and self identifies some recent STM issues      SENSATION: WFL (but does report neuropathy on bottoms of feet)      LOWER EXTREMITY MMT:  MMT Right eval Left eval  Hip flexion 3 3  Hip extension    Hip abduction 2+ best (seated) 2+ best (seated)  Hip adduction  Hip internal rotation    Hip external rotation    Knee flexion 4+ 4+  Knee extension 4+ 4+  Ankle dorsiflexion    Ankle plantarflexion    Ankle inversion    Ankle eversion     (Blank rows = not tested)    FUNCTIONAL TESTS:  5 times sit to stand: 12 seconds but had to use UEs and had posterior LOB/fall back into chair x1 time  Timed up and go (TUG): 15.2 seconds SPC, 10 seconds no device but increased pain  2 minute walk test: 391ft SPC   GAIT: Distance walked: 39ft  Assistive device utilized: Single point cane Level of assistance: Modified independence Comments: L LE internally rotated, hip abductor weakness noted, flexed at hips                                                                                                                                 TREATMENT DATE:  08/01/24 NuStep L 5 x 6 min Completed all HEP interventions LLE HS curls green 2x10 LLE LAQ 2lb 2x10 LLE SLR 3x5 LLE supine hip abduction 3x5  Light ROM of L hip in all directions 07/27/24  Eval, POC  Education as below      PATIENT EDUCATION:  Education details: exam findings, POC Person educated: Patient Education method:  Explanation Education comprehension: verbalized understanding  HOME EXERCISE PROGRAM: Access Code: [email or web address] URL: [email or web address] Date: 08/01/2024 Prepared by: Tanda Sorrow  Exercises - Supine Bridge  - 1 x daily - 7 x weekly - 3 sets - 10 reps - Seated Hip Adduction Squeeze with Ball  - 1 x daily - 7 x weekly - 3 sets - 10 reps - Seated Hip Abduction with Resistance  - 1 x daily - 7 x weekly - 3 sets - 10 reps - Sit to Stand with Hands on Knees  - 1 x daily - 7 x weekly - 3 sets - 5 repsTBD   ASSESSMENT:  CLINICAL IMPRESSION: Patient is a 74 y.o. F who was seen today for physical therapy evaluation and treatment for M97.8XXD,Z96.649 (ICD-10-CM) - Peri-prosthetic fracture of femur at tip of prosthesis, subsequent encounter. Today HEP established and reviewed. Cue to increase L hip involvement with add ball squeeze. Some compensation present with sit to stands.  Some pain with L hip K2C. Will make every effort to address pain, reduce fall risk, and address objective findings/desired functional goals moving forward.   OBJECTIVE IMPAIRMENTS: Abnormal gait, decreased activity tolerance, decreased balance, decreased cognition, decreased mobility, difficulty walking, decreased strength, hypomobility, and pain.   ACTIVITY LIMITATIONS: standing, squatting, stairs, transfers, and locomotion level  PARTICIPATION LIMITATIONS: meal prep, cleaning, laundry, driving, shopping, community activity, and yard work  PERSONAL FACTORS: Age, Behavior pattern, Fitness, Past/current experiences, Social background, and Time since onset of injury/illness/exacerbation are also affecting patient's functional outcome.   REHAB POTENTIAL: Good  CLINICAL DECISION MAKING: Stable/uncomplicated  EVALUATION COMPLEXITY: Low   GOALS: Goals  reviewed with patient? No  SHORT TERM GOALS: Target date: 08/24/2024   Will be compliant with appropriate progressive HEP  Baseline: Goal status:  INITIAL  2.  Will be able to complete 5xSTS test with no posterior LOB and pain no more than 5/10 Baseline:  Goal status: INITIAL  3.  Pain with standing/walking to have improved by at least 25% Baseline:  Goal status: INITIAL    LONG TERM GOALS: Target date: 09/21/2024    MMT to have improved by at least one grade in all weak groups  Baseline:  Goal status: INITIAL  2.  Pain with standing/walking to have improved by at least 50% Baseline:  Goal status: INITIAL  3.  Will score at least 50 on Berg to show reduced fall risk  Baseline:  Goal status: INITIAL  4.  Will be able to perform all functional tasks related to gardening, including floor to stand, with no more than S and pain no more than 5/10 Baseline:  Goal status: INITIAL  5.  LEFS to have improved by at least 10 points  Baseline:  Goal status: INITIAL     PLAN:  PT FREQUENCY: 2x/week  PT DURATION: 8 weeks  PLANNED INTERVENTIONS: 97750- Physical Performance Testing, 97110-Therapeutic exercises, 97530- Therapeutic activity, [id number]- Neuromuscular re-education, 97535- Self Care, [id number]- Manual therapy, [phone]- Gait training, and [phone]- Aquatic Therapy  PLAN FOR NEXT SESSION:  Hip strengthening, general conditioning, balance training   Tanda Sorrow, PTA 08/01/2024 1:02 PM    "

## 2024-08-03 ENCOUNTER — Ambulatory Visit: Admitting: Physical Therapy

## 2024-08-08 ENCOUNTER — Ambulatory Visit: Admitting: Physical Therapy

## 2024-08-10 ENCOUNTER — Ambulatory Visit: Admitting: Physical Therapy

## 2024-08-15 ENCOUNTER — Ambulatory Visit

## 2024-08-17 ENCOUNTER — Ambulatory Visit: Admitting: Physical Therapy
